# Patient Record
Sex: Female | Born: 1950 | Race: White | Hispanic: No | Marital: Single | Smoking: Never smoker
Health system: Southern US, Community
[De-identification: ages and names within clinical notes are randomized; demographics above are authoritative.]

## PROBLEM LIST (undated history)

## (undated) DIAGNOSIS — F419 Anxiety disorder, unspecified: Secondary | ICD-10-CM

## (undated) DIAGNOSIS — J189 Pneumonia, unspecified organism: Secondary | ICD-10-CM

## (undated) DIAGNOSIS — R112 Nausea with vomiting, unspecified: Secondary | ICD-10-CM

## (undated) DIAGNOSIS — M1990 Unspecified osteoarthritis, unspecified site: Secondary | ICD-10-CM

## (undated) DIAGNOSIS — R51 Headache: Secondary | ICD-10-CM

## (undated) DIAGNOSIS — R519 Headache, unspecified: Secondary | ICD-10-CM

## (undated) DIAGNOSIS — K219 Gastro-esophageal reflux disease without esophagitis: Secondary | ICD-10-CM

## (undated) DIAGNOSIS — E039 Hypothyroidism, unspecified: Secondary | ICD-10-CM

## (undated) DIAGNOSIS — I509 Heart failure, unspecified: Secondary | ICD-10-CM

## (undated) DIAGNOSIS — D649 Anemia, unspecified: Secondary | ICD-10-CM

## (undated) DIAGNOSIS — E785 Hyperlipidemia, unspecified: Secondary | ICD-10-CM

## (undated) DIAGNOSIS — R011 Cardiac murmur, unspecified: Secondary | ICD-10-CM

## (undated) DIAGNOSIS — C801 Malignant (primary) neoplasm, unspecified: Secondary | ICD-10-CM

## (undated) DIAGNOSIS — Z9889 Other specified postprocedural states: Secondary | ICD-10-CM

## (undated) HISTORY — PX: TONSILLECTOMY: SUR1361

## (undated) HISTORY — PX: EYE SURGERY: SHX253

## (undated) HISTORY — PX: COLONOSCOPY: SHX174

## (undated) HISTORY — PX: APPENDECTOMY: SHX54

## (undated) HISTORY — PX: CHOLECYSTECTOMY: SHX55

## (undated) HISTORY — PX: OOPHORECTOMY: SHX86

## (undated) HISTORY — PX: BREAST BIOPSY: SHX20

## (undated) HISTORY — PX: OTHER SURGICAL HISTORY: SHX169

## (undated) HISTORY — PX: BREAST EXCISIONAL BIOPSY: SUR124

---

## 1996-02-09 HISTORY — PX: NEUROPLASTY / TRANSPOSITION ULNAR NERVE AT ELBOW: SUR895

## 2004-07-17 ENCOUNTER — Ambulatory Visit: Payer: Self-pay | Admitting: Unknown Physician Specialty

## 2004-08-19 ENCOUNTER — Ambulatory Visit: Payer: Self-pay | Admitting: Surgery

## 2004-09-18 ENCOUNTER — Ambulatory Visit: Payer: Self-pay | Admitting: Surgery

## 2004-11-26 ENCOUNTER — Ambulatory Visit: Payer: Self-pay | Admitting: Unknown Physician Specialty

## 2005-09-25 ENCOUNTER — Ambulatory Visit: Payer: Self-pay | Admitting: Unknown Physician Specialty

## 2006-01-14 ENCOUNTER — Ambulatory Visit: Payer: Self-pay | Admitting: Orthopedic Surgery

## 2006-09-15 DIAGNOSIS — C4492 Squamous cell carcinoma of skin, unspecified: Secondary | ICD-10-CM

## 2006-09-15 HISTORY — DX: Squamous cell carcinoma of skin, unspecified: C44.92

## 2006-10-06 ENCOUNTER — Ambulatory Visit: Payer: Self-pay | Admitting: Unknown Physician Specialty

## 2007-10-19 ENCOUNTER — Ambulatory Visit: Payer: Self-pay | Admitting: Unknown Physician Specialty

## 2007-12-10 ENCOUNTER — Emergency Department: Payer: Self-pay | Admitting: Emergency Medicine

## 2007-12-21 ENCOUNTER — Emergency Department: Payer: Self-pay | Admitting: Emergency Medicine

## 2008-06-23 ENCOUNTER — Ambulatory Visit: Payer: Self-pay | Admitting: Sports Medicine

## 2008-10-29 ENCOUNTER — Ambulatory Visit: Payer: Self-pay | Admitting: Unknown Physician Specialty

## 2008-12-09 ENCOUNTER — Ambulatory Visit: Payer: Self-pay | Admitting: Oncology

## 2008-12-31 ENCOUNTER — Ambulatory Visit: Payer: Self-pay | Admitting: Oncology

## 2009-01-02 ENCOUNTER — Ambulatory Visit: Payer: Self-pay | Admitting: Ophthalmology

## 2009-01-09 ENCOUNTER — Ambulatory Visit: Payer: Self-pay | Admitting: Oncology

## 2009-01-15 ENCOUNTER — Ambulatory Visit: Payer: Self-pay | Admitting: Ophthalmology

## 2009-02-26 ENCOUNTER — Ambulatory Visit: Payer: Self-pay | Admitting: Ophthalmology

## 2009-10-15 DIAGNOSIS — Z86018 Personal history of other benign neoplasm: Secondary | ICD-10-CM

## 2009-10-15 HISTORY — DX: Personal history of other benign neoplasm: Z86.018

## 2009-10-31 ENCOUNTER — Ambulatory Visit: Payer: Self-pay | Admitting: Unknown Physician Specialty

## 2009-11-12 ENCOUNTER — Ambulatory Visit: Payer: Self-pay | Admitting: Unknown Physician Specialty

## 2009-12-21 ENCOUNTER — Emergency Department: Payer: Self-pay | Admitting: Emergency Medicine

## 2010-06-02 ENCOUNTER — Ambulatory Visit: Payer: Self-pay | Admitting: Unknown Physician Specialty

## 2010-06-05 ENCOUNTER — Ambulatory Visit: Payer: Self-pay | Admitting: Unknown Physician Specialty

## 2010-09-23 IMAGING — CT CT HEAD WITHOUT CONTRAST
2 series · 16 of 30 positions shown, 20 images · non-contrast
Comparison: none

REASON FOR EXAM: right temporal headache, dizziness
COMMENTS:

[Series 2: without · axial · non-contrast · 0.42mm/px · z∈[+762,+882]mm · 13 of 30 slices shown, 17 images]
[im 3/30  brain]
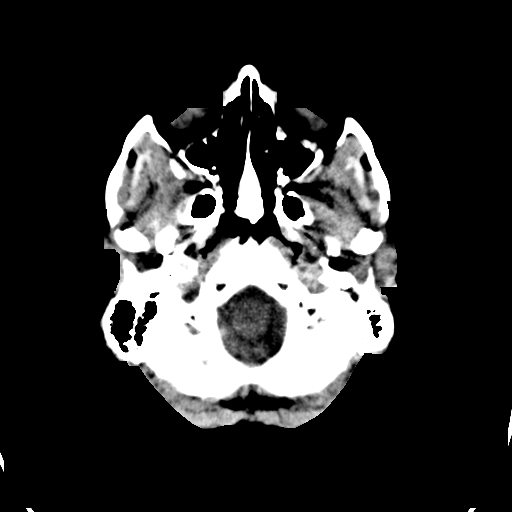
[im 3/30  bone]
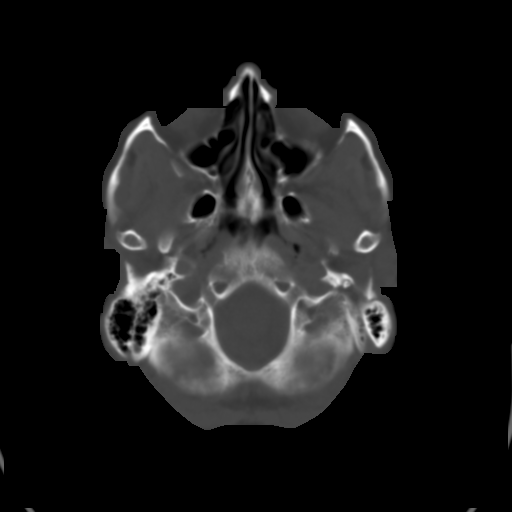
[im 5/30  brain]
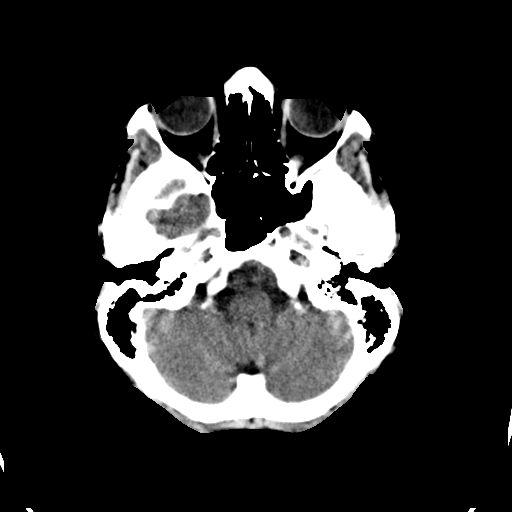
[im 7/30  brain]
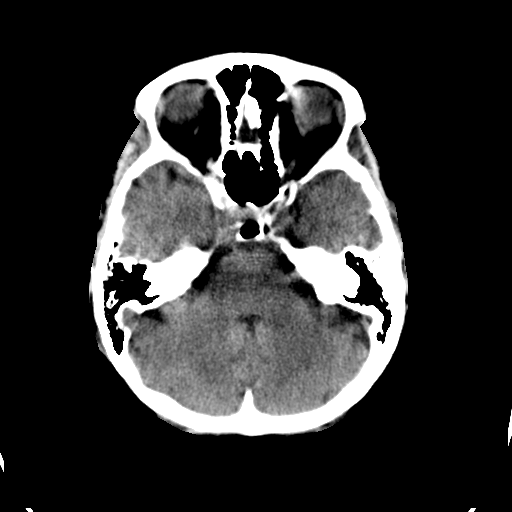
[im 9/30  brain]
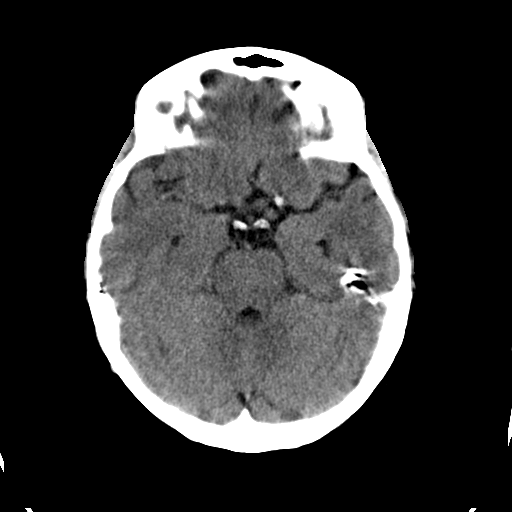
[im 11/30  brain]
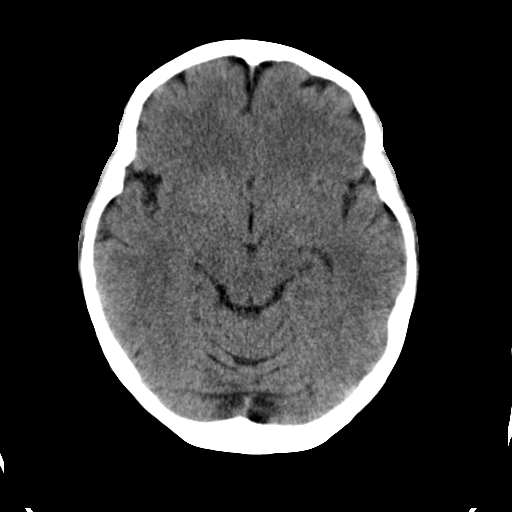
[im 11/30  bone]
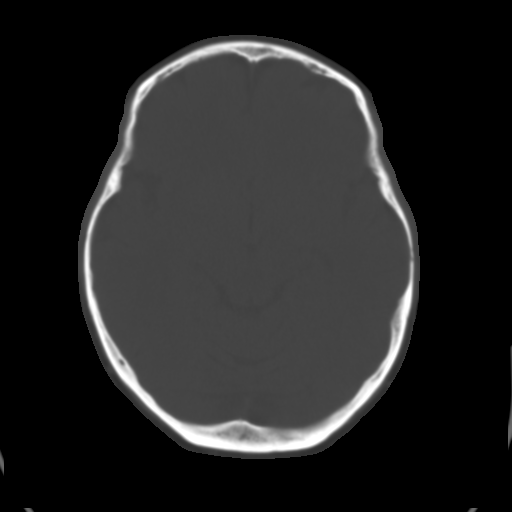
[im 13/30  brain]
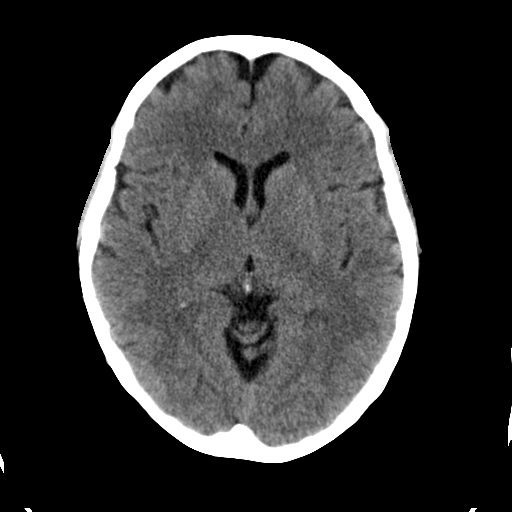
[im 15/30  brain]
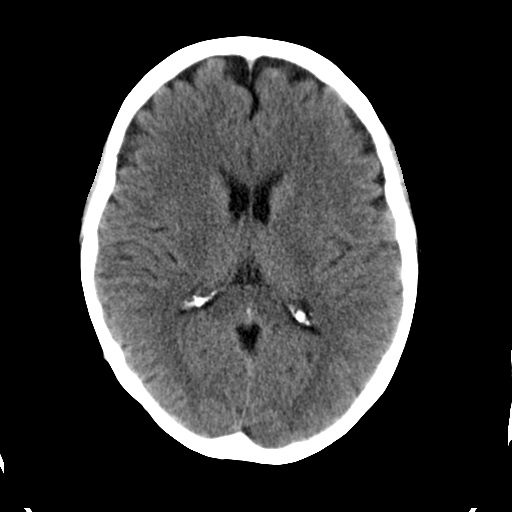
[im 17/30  brain]
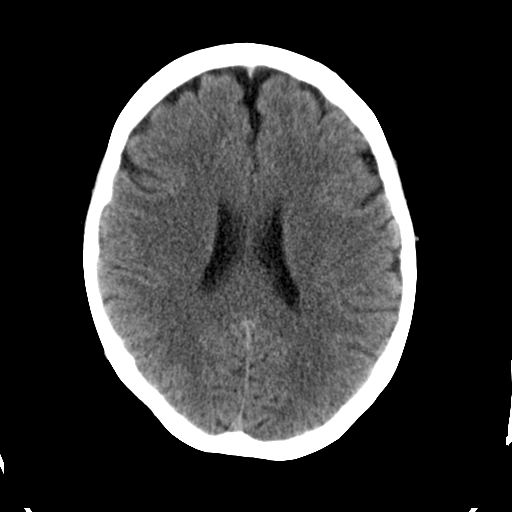
[im 19/30  brain]
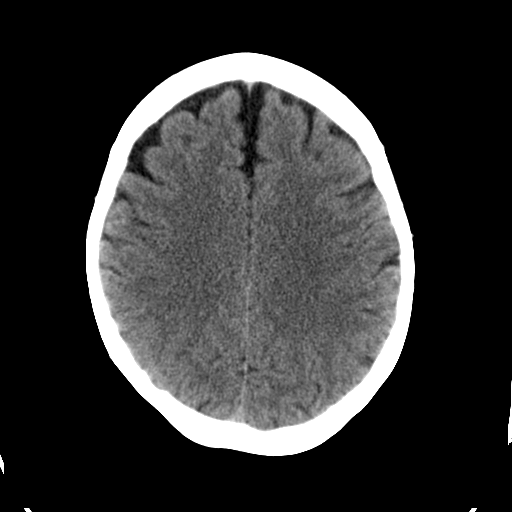
[im 19/30  bone]
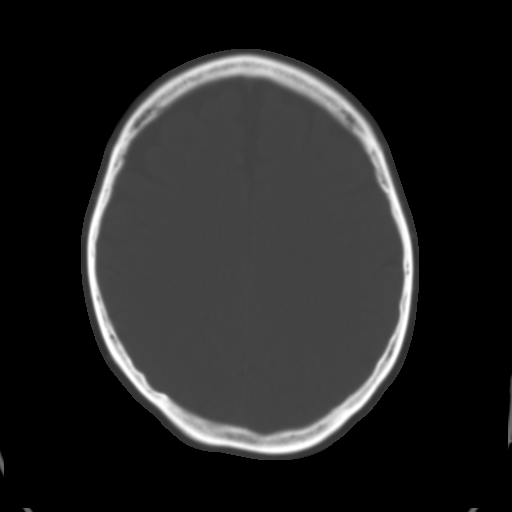
[im 21/30  brain]
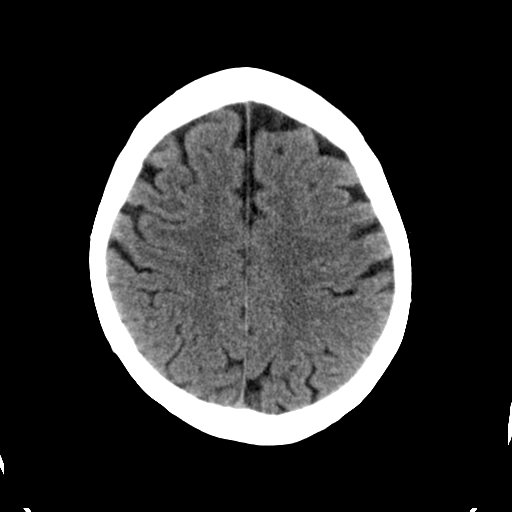
[im 23/30  brain]
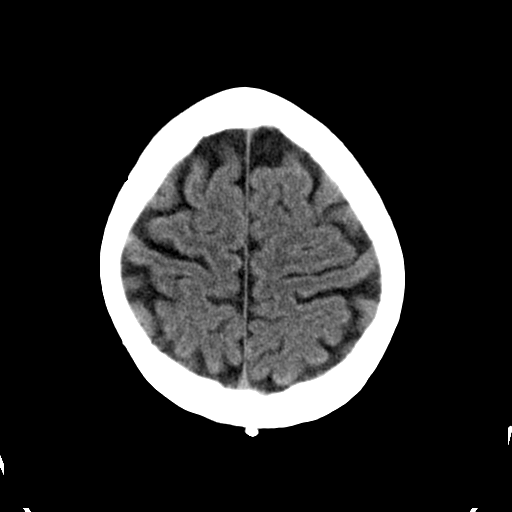
[im 25/30  brain]
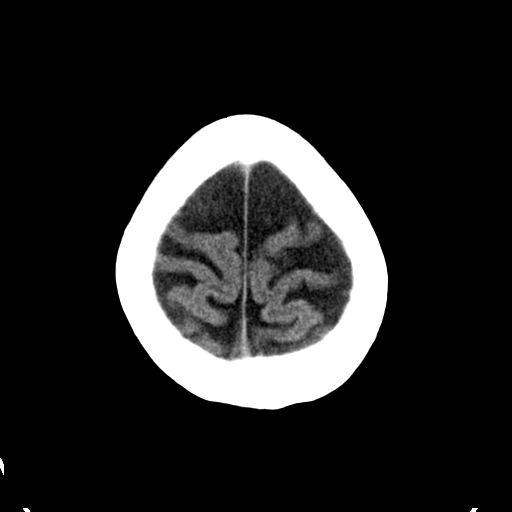
[im 27/30  brain]
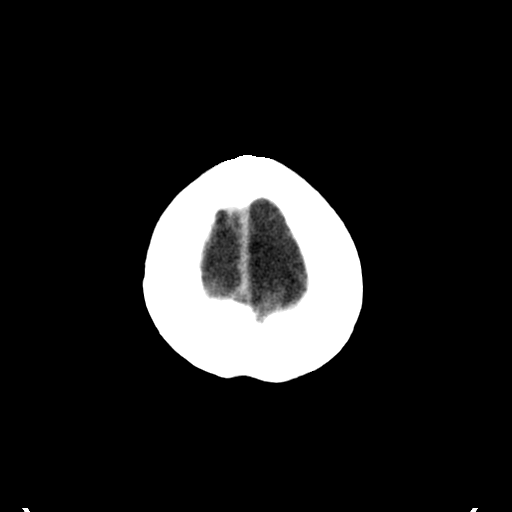
[im 27/30  bone]
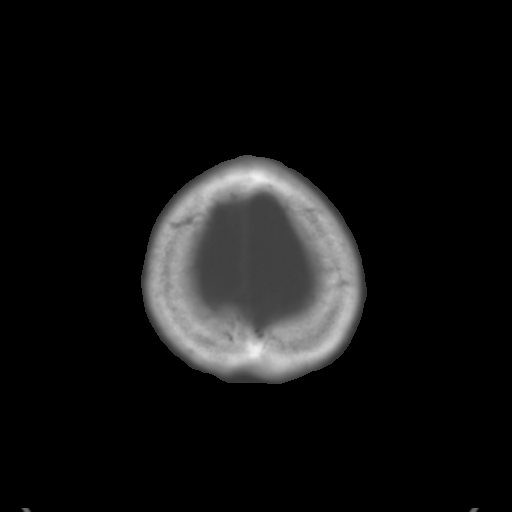

[Series 3: bone · axial · 0.42mm/px · z∈[+762,+802]mm · 3 of 30 slices shown]
[im 3/30  bone]
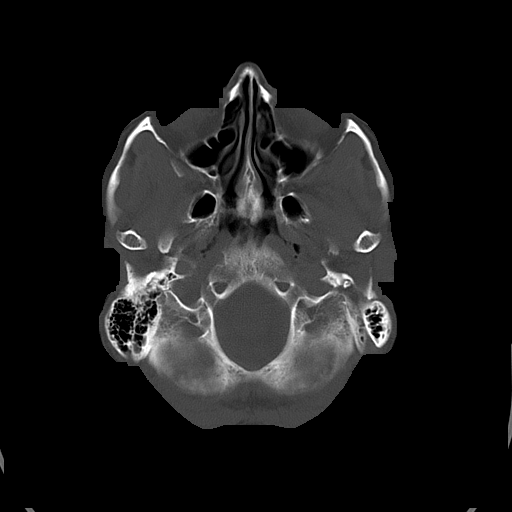
[im 7/30  bone]
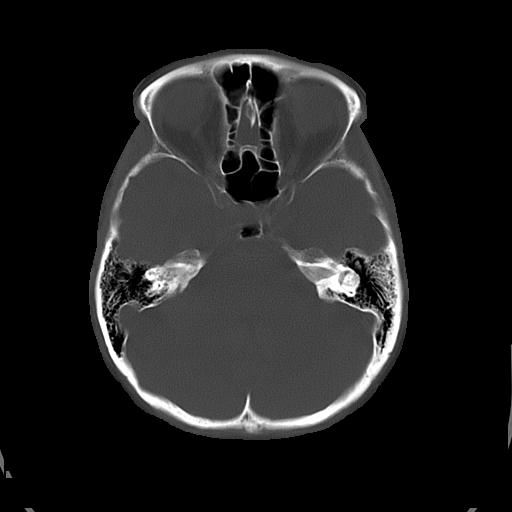
[im 11/30  bone]
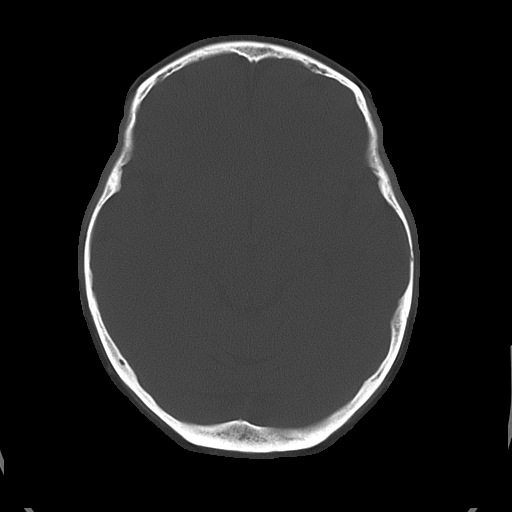

[16 of 30 positions shown; findings below may reference images not displayed]

PROCEDURE:     CT  - CT HEAD WITHOUT CONTRAST  - December 21, 2009  [DATE]

RESULT:     Axial CT scanning was performed through the brain at 5 mm
intervals and slice thicknesses.

The ventricles are normal in size and position. There is mild diffuse
cerebral atrophy. The cerebellum and brainstem are normal in density. There
is no evidence of an acute intracranial hemorrhage nor evidence of an
evolving ischemic infarction. At bone window settings I do not see evidence
of an acute skull fracture. The observed portions of the paranasal sinuses
are clear.
IMPRESSION: I see no acute intracranial abnormality.

## 2010-11-04 ENCOUNTER — Ambulatory Visit: Payer: Self-pay | Admitting: Unknown Physician Specialty

## 2011-01-29 ENCOUNTER — Ambulatory Visit: Payer: Self-pay | Admitting: Gastroenterology

## 2011-02-06 LAB — PATHOLOGY REPORT

## 2011-02-24 ENCOUNTER — Ambulatory Visit: Payer: Self-pay | Admitting: Gastroenterology

## 2011-05-21 ENCOUNTER — Ambulatory Visit: Payer: Self-pay | Admitting: Gastroenterology

## 2011-11-05 ENCOUNTER — Ambulatory Visit: Payer: Self-pay | Admitting: Physician Assistant

## 2011-11-25 ENCOUNTER — Ambulatory Visit: Payer: Self-pay | Admitting: Neurology

## 2012-03-24 DIAGNOSIS — E7800 Pure hypercholesterolemia, unspecified: Secondary | ICD-10-CM | POA: Insufficient documentation

## 2012-03-24 DIAGNOSIS — E559 Vitamin D deficiency, unspecified: Secondary | ICD-10-CM | POA: Insufficient documentation

## 2012-03-24 DIAGNOSIS — J309 Allergic rhinitis, unspecified: Secondary | ICD-10-CM | POA: Insufficient documentation

## 2012-03-24 DIAGNOSIS — C4492 Squamous cell carcinoma of skin, unspecified: Secondary | ICD-10-CM | POA: Insufficient documentation

## 2012-04-04 DIAGNOSIS — G5600 Carpal tunnel syndrome, unspecified upper limb: Secondary | ICD-10-CM | POA: Insufficient documentation

## 2012-08-26 DIAGNOSIS — R252 Cramp and spasm: Secondary | ICD-10-CM | POA: Insufficient documentation

## 2012-08-26 DIAGNOSIS — E039 Hypothyroidism, unspecified: Secondary | ICD-10-CM | POA: Insufficient documentation

## 2012-11-09 ENCOUNTER — Ambulatory Visit: Payer: Self-pay | Admitting: Internal Medicine

## 2013-07-03 DIAGNOSIS — M810 Age-related osteoporosis without current pathological fracture: Secondary | ICD-10-CM | POA: Insufficient documentation

## 2013-09-22 DIAGNOSIS — M1990 Unspecified osteoarthritis, unspecified site: Secondary | ICD-10-CM | POA: Insufficient documentation

## 2013-10-26 DIAGNOSIS — Z86010 Personal history of colonic polyps: Secondary | ICD-10-CM | POA: Insufficient documentation

## 2013-11-23 ENCOUNTER — Ambulatory Visit: Payer: Self-pay | Admitting: Internal Medicine

## 2014-03-28 DIAGNOSIS — M5090 Cervical disc disorder, unspecified, unspecified cervical region: Secondary | ICD-10-CM | POA: Insufficient documentation

## 2014-03-28 DIAGNOSIS — E7849 Other hyperlipidemia: Secondary | ICD-10-CM | POA: Insufficient documentation

## 2014-09-28 ENCOUNTER — Other Ambulatory Visit: Payer: Self-pay | Admitting: Internal Medicine

## 2014-09-28 DIAGNOSIS — M5010 Cervical disc disorder with radiculopathy, unspecified cervical region: Secondary | ICD-10-CM

## 2014-10-03 ENCOUNTER — Ambulatory Visit
Admission: RE | Admit: 2014-10-03 | Discharge: 2014-10-03 | Disposition: A | Discharge status: Home / Self Care | Payer: Medicare Other | Source: Ambulatory Visit | Attending: Internal Medicine | Admitting: Internal Medicine

## 2014-10-03 DIAGNOSIS — M5022 Other cervical disc displacement, mid-cervical region: Secondary | ICD-10-CM | POA: Insufficient documentation

## 2014-10-03 DIAGNOSIS — M4802 Spinal stenosis, cervical region: Secondary | ICD-10-CM | POA: Diagnosis not present

## 2014-10-03 DIAGNOSIS — M5010 Cervical disc disorder with radiculopathy, unspecified cervical region: Secondary | ICD-10-CM | POA: Diagnosis present

## 2014-12-06 ENCOUNTER — Other Ambulatory Visit: Payer: Self-pay | Admitting: Internal Medicine

## 2014-12-06 DIAGNOSIS — Z1231 Encounter for screening mammogram for malignant neoplasm of breast: Secondary | ICD-10-CM

## 2014-12-12 ENCOUNTER — Other Ambulatory Visit: Payer: Self-pay | Admitting: Internal Medicine

## 2014-12-12 ENCOUNTER — Ambulatory Visit
Admission: RE | Admit: 2014-12-12 | Discharge: 2014-12-12 | Disposition: A | Discharge status: Home / Self Care | Payer: Medicare Other | Source: Ambulatory Visit | Attending: Internal Medicine | Admitting: Internal Medicine

## 2014-12-12 DIAGNOSIS — Z1231 Encounter for screening mammogram for malignant neoplasm of breast: Secondary | ICD-10-CM

## 2014-12-12 HISTORY — DX: Malignant (primary) neoplasm, unspecified: C80.1

## 2015-01-22 DIAGNOSIS — Z85828 Personal history of other malignant neoplasm of skin: Secondary | ICD-10-CM

## 2015-01-22 HISTORY — DX: Personal history of other malignant neoplasm of skin: Z85.828

## 2015-03-27 DIAGNOSIS — E538 Deficiency of other specified B group vitamins: Secondary | ICD-10-CM | POA: Insufficient documentation

## 2015-05-23 ENCOUNTER — Other Ambulatory Visit: Payer: Self-pay | Admitting: Obstetrics and Gynecology

## 2015-05-23 DIAGNOSIS — Z1231 Encounter for screening mammogram for malignant neoplasm of breast: Secondary | ICD-10-CM

## 2015-08-09 ENCOUNTER — Other Ambulatory Visit: Payer: Self-pay | Admitting: Neurosurgery

## 2015-09-10 ENCOUNTER — Other Ambulatory Visit: Payer: Self-pay

## 2015-09-10 ENCOUNTER — Encounter (HOSPITAL_COMMUNITY): Payer: Self-pay

## 2015-09-10 ENCOUNTER — Encounter (HOSPITAL_COMMUNITY)
Admission: RE | Admit: 2015-09-10 | Discharge: 2015-09-10 | Disposition: A | Discharge status: Home / Self Care | Payer: Medicare Other | Source: Ambulatory Visit | Attending: Neurosurgery | Admitting: Neurosurgery

## 2015-09-10 DIAGNOSIS — M4722 Other spondylosis with radiculopathy, cervical region: Secondary | ICD-10-CM | POA: Insufficient documentation

## 2015-09-10 DIAGNOSIS — Z01818 Encounter for other preprocedural examination: Secondary | ICD-10-CM | POA: Diagnosis present

## 2015-09-10 DIAGNOSIS — Z01812 Encounter for preprocedural laboratory examination: Secondary | ICD-10-CM | POA: Insufficient documentation

## 2015-09-10 HISTORY — DX: Other specified postprocedural states: R11.2

## 2015-09-10 HISTORY — DX: Anxiety disorder, unspecified: F41.9

## 2015-09-10 HISTORY — DX: Headache: R51

## 2015-09-10 HISTORY — DX: Cardiac murmur, unspecified: R01.1

## 2015-09-10 HISTORY — DX: Anemia, unspecified: D64.9

## 2015-09-10 HISTORY — DX: Pneumonia, unspecified organism: J18.9

## 2015-09-10 HISTORY — DX: Hypothyroidism, unspecified: E03.9

## 2015-09-10 HISTORY — DX: Hyperlipidemia, unspecified: E78.5

## 2015-09-10 HISTORY — DX: Other specified postprocedural states: Z98.890

## 2015-09-10 HISTORY — DX: Unspecified osteoarthritis, unspecified site: M19.90

## 2015-09-10 HISTORY — DX: Gastro-esophageal reflux disease without esophagitis: K21.9

## 2015-09-10 HISTORY — DX: Headache, unspecified: R51.9

## 2015-09-10 LAB — CBC
HEMATOCRIT: 42.7 % (ref 36.0–46.0)
Hemoglobin: 13.9 g/dL (ref 12.0–15.0)
MCH: 30 pg (ref 26.0–34.0)
MCHC: 32.6 g/dL (ref 30.0–36.0)
MCV: 92 fL (ref 78.0–100.0)
PLATELETS: 329 10*3/uL (ref 150–400)
RBC: 4.64 MIL/uL (ref 3.87–5.11)
RDW: 12.6 % (ref 11.5–15.5)
WBC: 7.2 10*3/uL (ref 4.0–10.5)

## 2015-09-10 LAB — SURGICAL PCR SCREEN
MRSA, PCR: NEGATIVE
Staphylococcus aureus: NEGATIVE

## 2015-09-10 LAB — BASIC METABOLIC PANEL
Anion gap: 9 (ref 5–15)
BUN: 16 mg/dL (ref 6–20)
CHLORIDE: 107 mmol/L (ref 101–111)
CO2: 25 mmol/L (ref 22–32)
CREATININE: 0.62 mg/dL (ref 0.44–1.00)
Calcium: 10 mg/dL (ref 8.9–10.3)
Glucose, Bld: 96 mg/dL (ref 65–99)
POTASSIUM: 4.2 mmol/L (ref 3.5–5.1)
SODIUM: 141 mmol/L (ref 135–145)

## 2015-09-10 NOTE — Progress Notes (Signed)
PCP is Dr Emily Filbert  States she saw Dr Billey Gosling 20+ years ago for a questionable heart murmur. She states she had a card cath at the time and everything was ok, she has not had any heart studies since.  Denies ever having an echo or stress test. States that it has many years since she had an EKG. Request sent for EKG, last office visit to Dr Ammie Ferrier office.

## 2015-09-10 NOTE — Pre-Procedure Instructions (Addendum)
Taylor Hunt  09/10/2015      [facility] [place], [place] [facility] [place] [address] Phone: [phone] Fax: [phone]    Your procedure is scheduled on May 10  Report to [place] at 630 A.M.  Call this number if you have problems the morning of surgery:  [phone]   Remember:  Do not eat food or drink liquids after midnight.  Take these medicines the morning of surgery with A SIP OF WATER  tylenol if needed,  Flonase nasal spray if needed, Levothyroxine (Synthroid)   Stop taking Aspirin, Ibuprofen, Advil, Motrin, Aleve, Herbal medications, Fish Oil, Celebrex   Do not wear jewelry, make-up or nail polish.  Do not wear lotions, powders, or perfumes.  You may wear deodorant.  Do not shave 48 hours prior to surgery.  Men may shave face and neck.  Do not bring valuables to the hospital.  [facility] is not responsible for any belongings or valuables.  Contacts, dentures or bridgework may not be worn into surgery.  Leave your suitcase in the car.  After surgery it may be brought to your room.  For patients admitted to the hospital, discharge time will be determined by your treatment team.  Patients discharged the day of surgery will not be allowed to drive home.    Special instructions:  [facility] - Preparing for Surgery  Before surgery, you can play an important role.  Because skin is not sterile, your skin needs to be as free of germs as possible.  You can reduce the number of germs on you skin by washing with CHG (chlorahexidine gluconate) soap before surgery.  CHG is an antiseptic cleaner which kills germs and bonds with the skin to continue killing germs even after washing.  Please DO NOT use if you have an allergy to CHG or antibacterial soaps.  If your skin becomes reddened/irritated stop using the CHG and inform your nurse when you arrive at Short Stay.  Do not shave (including  legs and underarms) for at least 48 hours prior to the first CHG shower.  You may shave your face.  Please follow these instructions carefully:   1.  Shower with CHG Soap the night before surgery and the                                morning of Surgery.  2.  If you choose to wash your hair, wash your hair first as usual with your       normal shampoo.  3.  After you shampoo, rinse your hair and body thoroughly to remove the                      Shampoo.  4.  Use CHG as you would any other liquid soap.  You can apply chg directly       to the skin and wash gently with scrungie or a clean washcloth.  5.  Apply the CHG Soap to your body ONLY FROM THE NECK DOWN.        Do not use on open wounds or open sores.  Avoid contact with your eyes,       ears, mouth and genitals (private parts).  Wash genitals (private parts)       with your normal soap.  6.  Wash thoroughly,  paying special attention to the area where your surgery        will be performed.  7.  Thoroughly rinse your body with warm water from the neck down.  8.  DO NOT shower/wash with your normal soap after using and rinsing off       the CHG Soap.  9.  Pat yourself dry with a clean towel.            10.  Wear clean pajamas.            11.  Place clean sheets on your bed the night of your first shower and do not        sleep with pets.  Day of Surgery  Do not apply any lotions/deoderants the morning of surgery.  Please wear clean clothes to the hospital/surgery center.     Please read over the following fact sheets that you were given. Pain Booklet, Coughing and Deep Breathing, MRSA Information and Surgical Site Infection Prevention

## 2015-09-18 ENCOUNTER — Inpatient Hospital Stay (HOSPITAL_COMMUNITY): Payer: Medicare Other

## 2015-09-18 ENCOUNTER — Inpatient Hospital Stay (HOSPITAL_COMMUNITY): Payer: Medicare Other | Admitting: Anesthesiology

## 2015-09-18 ENCOUNTER — Ambulatory Visit (HOSPITAL_COMMUNITY)
Admission: RE | Admit: 2015-09-18 | Discharge: 2015-09-19 | Disposition: A | Discharge status: Home / Self Care | Payer: Medicare Other | Source: Ambulatory Visit | Attending: Neurosurgery | Admitting: Neurosurgery

## 2015-09-18 ENCOUNTER — Encounter (HOSPITAL_COMMUNITY): Payer: Self-pay | Admitting: Surgery

## 2015-09-18 ENCOUNTER — Encounter (HOSPITAL_COMMUNITY)
Admission: RE | Disposition: A | Discharge status: Home / Self Care | Payer: Self-pay | Source: Ambulatory Visit | Attending: Neurosurgery

## 2015-09-18 DIAGNOSIS — M5412 Radiculopathy, cervical region: Secondary | ICD-10-CM | POA: Diagnosis present

## 2015-09-18 DIAGNOSIS — E785 Hyperlipidemia, unspecified: Secondary | ICD-10-CM | POA: Insufficient documentation

## 2015-09-18 DIAGNOSIS — Z419 Encounter for procedure for purposes other than remedying health state, unspecified: Secondary | ICD-10-CM

## 2015-09-18 DIAGNOSIS — E039 Hypothyroidism, unspecified: Secondary | ICD-10-CM | POA: Insufficient documentation

## 2015-09-18 DIAGNOSIS — M50123 Cervical disc disorder at C6-C7 level with radiculopathy: Secondary | ICD-10-CM | POA: Diagnosis not present

## 2015-09-18 HISTORY — PX: ANTERIOR CERVICAL DECOMP/DISCECTOMY FUSION: SHX1161

## 2015-09-18 SURGERY — ANTERIOR CERVICAL DECOMPRESSION/DISCECTOMY FUSION 2 LEVELS
Anesthesia: General

## 2015-09-18 MED ORDER — DEXAMETHASONE SODIUM PHOSPHATE 4 MG/ML IJ SOLN
4.0000 mg | Freq: Four times a day (QID) | INTRAMUSCULAR | Status: AC
  Administered 2015-09-18: 4 mg via INTRAVENOUS
  Filled 2015-09-18 (×2): qty 1

## 2015-09-18 MED ORDER — 0.9 % SODIUM CHLORIDE (POUR BTL) OPTIME
TOPICAL | Status: DC | PRN
  Administered 2015-09-18: 1000 mL

## 2015-09-18 MED ORDER — FLUTICASONE PROPIONATE 50 MCG/ACT NA SUSP
1.0000 | Freq: Every day | NASAL | Status: DC | PRN
  Filled 2015-09-18: qty 16

## 2015-09-18 MED ORDER — HYDROMORPHONE HCL 1 MG/ML IJ SOLN
INTRAMUSCULAR | Status: AC
  Administered 2015-09-18: 0.25 mg via INTRAVENOUS
  Filled 2015-09-18: qty 1

## 2015-09-18 MED ORDER — BACITRACIN ZINC 500 UNIT/GM EX OINT
TOPICAL_OINTMENT | CUTANEOUS | Status: DC | PRN
  Administered 2015-09-18: 1 via TOPICAL

## 2015-09-18 MED ORDER — LEVOTHYROXINE SODIUM 112 MCG PO TABS
112.0000 ug | ORAL_TABLET | Freq: Every day | ORAL | Status: DC
  Administered 2015-09-19: 112 ug via ORAL
  Filled 2015-09-18: qty 1

## 2015-09-18 MED ORDER — SODIUM CHLORIDE 0.9 % IR SOLN
Status: DC | PRN
  Administered 2015-09-18: 08:00:00

## 2015-09-18 MED ORDER — PROPOFOL 10 MG/ML IV BOLUS
INTRAVENOUS | Status: AC
  Filled 2015-09-18: qty 20

## 2015-09-18 MED ORDER — MIDAZOLAM HCL 5 MG/5ML IJ SOLN
INTRAMUSCULAR | Status: DC | PRN
  Administered 2015-09-18: 2 mg via INTRAVENOUS

## 2015-09-18 MED ORDER — DOCUSATE SODIUM 100 MG PO CAPS
100.0000 mg | ORAL_CAPSULE | Freq: Two times a day (BID) | ORAL | Status: DC
  Administered 2015-09-18: 100 mg via ORAL
  Filled 2015-09-18: qty 1

## 2015-09-18 MED ORDER — PHENOL 1.4 % MT LIQD: 1.0000 | OROMUCOSAL | Status: DC | PRN

## 2015-09-18 MED ORDER — LACTATED RINGERS IV SOLN: INTRAVENOUS | Status: DC

## 2015-09-18 MED ORDER — ONDANSETRON HCL 4 MG/2ML IJ SOLN
INTRAMUSCULAR | Status: AC
  Filled 2015-09-18: qty 2

## 2015-09-18 MED ORDER — ADULT MULTIVITAMIN W/MINERALS CH: 1.0000 | ORAL_TABLET | Freq: Every day | ORAL | Status: DC

## 2015-09-18 MED ORDER — THROMBIN 5000 UNITS EX SOLR
CUTANEOUS | Status: DC | PRN
  Administered 2015-09-18 (×4): 5000 [IU] via TOPICAL

## 2015-09-18 MED ORDER — ROCURONIUM BROMIDE 50 MG/5ML IV SOLN
INTRAVENOUS | Status: AC
  Filled 2015-09-18: qty 1

## 2015-09-18 MED ORDER — PROPOFOL 500 MG/50ML IV EMUL
INTRAVENOUS | Status: DC | PRN
  Administered 2015-09-18: 100 ug/kg/min via INTRAVENOUS

## 2015-09-18 MED ORDER — ACETAMINOPHEN 650 MG RE SUPP: 650.0000 mg | RECTAL | Status: DC | PRN

## 2015-09-18 MED ORDER — HYDROMORPHONE HCL 1 MG/ML IJ SOLN
0.2500 mg | INTRAMUSCULAR | Status: DC | PRN
  Administered 2015-09-18 (×4): 0.25 mg via INTRAVENOUS

## 2015-09-18 MED ORDER — ACETAMINOPHEN 325 MG PO TABS
650.0000 mg | ORAL_TABLET | ORAL | Status: DC | PRN
  Administered 2015-09-18 – 2015-09-19 (×3): 650 mg via ORAL
  Filled 2015-09-18 (×3): qty 2

## 2015-09-18 MED ORDER — BUPIVACAINE-EPINEPHRINE (PF) 0.5% -1:200000 IJ SOLN
INTRAMUSCULAR | Status: DC | PRN
  Administered 2015-09-18: 10 mL via PERINEURAL

## 2015-09-18 MED ORDER — CEFAZOLIN SODIUM-DEXTROSE 2-4 GM/100ML-% IV SOLN
2.0000 g | INTRAVENOUS | Status: AC
  Administered 2015-09-18: 2 g via INTRAVENOUS

## 2015-09-18 MED ORDER — SCOPOLAMINE 1 MG/3DAYS TD PT72
MEDICATED_PATCH | TRANSDERMAL | Status: AC
  Administered 2015-09-18: 1 via TRANSDERMAL
  Filled 2015-09-18: qty 1

## 2015-09-18 MED ORDER — PROMETHAZINE HCL 25 MG/ML IJ SOLN
25.0000 mg | Freq: Once | INTRAMUSCULAR | Status: AC
  Administered 2015-09-18: 25 mg via INTRAMUSCULAR
  Filled 2015-09-18: qty 1

## 2015-09-18 MED ORDER — FENTANYL CITRATE (PF) 250 MCG/5ML IJ SOLN
INTRAMUSCULAR | Status: AC
  Filled 2015-09-18: qty 5

## 2015-09-18 MED ORDER — ONDANSETRON HCL 4 MG/2ML IJ SOLN
4.0000 mg | Freq: Four times a day (QID) | INTRAMUSCULAR | Status: AC | PRN
  Administered 2015-09-18: 4 mg via INTRAVENOUS

## 2015-09-18 MED ORDER — PROPOFOL 1000 MG/100ML IV EMUL
INTRAVENOUS | Status: AC
  Filled 2015-09-18: qty 200

## 2015-09-18 MED ORDER — PROPOFOL 10 MG/ML IV BOLUS
INTRAVENOUS | Status: DC | PRN
  Administered 2015-09-18: 200 mg via INTRAVENOUS

## 2015-09-18 MED ORDER — ALUM & MAG HYDROXIDE-SIMETH 200-200-20 MG/5ML PO SUSP: 30.0000 mL | Freq: Four times a day (QID) | ORAL | Status: DC | PRN

## 2015-09-18 MED ORDER — LIDOCAINE 2% (20 MG/ML) 5 ML SYRINGE
INTRAMUSCULAR | Status: AC
  Filled 2015-09-18: qty 5

## 2015-09-18 MED ORDER — SODIUM CHLORIDE 0.9 % IV SOLN
0.0500 ug/kg/min | INTRAVENOUS | Status: AC
Start: 2015-09-18 — End: 2015-09-18
  Administered 2015-09-18: 0.05 ug/kg/min via INTRAVENOUS
  Filled 2015-09-18: qty 5000

## 2015-09-18 MED ORDER — DIAZEPAM 5 MG PO TABS
5.0000 mg | ORAL_TABLET | Freq: Four times a day (QID) | ORAL | Status: DC | PRN
  Administered 2015-09-18: 5 mg via ORAL
  Filled 2015-09-18: qty 1

## 2015-09-18 MED ORDER — HYDROMORPHONE HCL 1 MG/ML IJ SOLN: 1.0000 mg | INTRAMUSCULAR | Status: DC | PRN

## 2015-09-18 MED ORDER — HEMOSTATIC AGENTS (NO CHARGE) OPTIME
TOPICAL | Status: DC | PRN
  Administered 2015-09-18 (×2): 1 via TOPICAL

## 2015-09-18 MED ORDER — ROCURONIUM BROMIDE 100 MG/10ML IV SOLN
INTRAVENOUS | Status: DC | PRN
  Administered 2015-09-18: 10 mg via INTRAVENOUS
  Administered 2015-09-18 (×3): 20 mg via INTRAVENOUS
  Administered 2015-09-18: 50 mg via INTRAVENOUS

## 2015-09-18 MED ORDER — SUGAMMADEX SODIUM 200 MG/2ML IV SOLN
INTRAVENOUS | Status: AC
  Filled 2015-09-18: qty 2

## 2015-09-18 MED ORDER — LIDOCAINE HCL (CARDIAC) 20 MG/ML IV SOLN
INTRAVENOUS | Status: DC | PRN
  Administered 2015-09-18: 60 mg via INTRAVENOUS

## 2015-09-18 MED ORDER — ACETAMINOPHEN 10 MG/ML IV SOLN
INTRAVENOUS | Status: DC | PRN
  Administered 2015-09-18: 1000 mg via INTRAVENOUS

## 2015-09-18 MED ORDER — MIDAZOLAM HCL 2 MG/2ML IJ SOLN
INTRAMUSCULAR | Status: AC
  Filled 2015-09-18: qty 2

## 2015-09-18 MED ORDER — OXYCODONE HCL 5 MG PO TABS: 5.0000 mg | ORAL_TABLET | Freq: Once | ORAL | Status: DC | PRN

## 2015-09-18 MED ORDER — LACTATED RINGERS IV SOLN
INTRAVENOUS | Status: DC | PRN
  Administered 2015-09-18 (×2): via INTRAVENOUS

## 2015-09-18 MED ORDER — DEXAMETHASONE SODIUM PHOSPHATE 10 MG/ML IJ SOLN
INTRAMUSCULAR | Status: DC | PRN
  Administered 2015-09-18: 10 mg via INTRAVENOUS

## 2015-09-18 MED ORDER — MONTELUKAST SODIUM 10 MG PO TABS
10.0000 mg | ORAL_TABLET | Freq: Every day | ORAL | Status: DC
  Administered 2015-09-18: 10 mg via ORAL
  Filled 2015-09-18: qty 1

## 2015-09-18 MED ORDER — SUGAMMADEX SODIUM 200 MG/2ML IV SOLN
INTRAVENOUS | Status: DC | PRN
  Administered 2015-09-18: 150 mg via INTRAVENOUS

## 2015-09-18 MED ORDER — FAMOTIDINE 20 MG PO TABS: 40.0000 mg | ORAL_TABLET | Freq: Every evening | ORAL | Status: DC | PRN

## 2015-09-18 MED ORDER — ONDANSETRON HCL 4 MG/2ML IJ SOLN
4.0000 mg | INTRAMUSCULAR | Status: DC | PRN
  Administered 2015-09-18 (×2): 4 mg via INTRAVENOUS
  Filled 2015-09-18 (×2): qty 2

## 2015-09-18 MED ORDER — MENTHOL 3 MG MT LOZG: 1.0000 | LOZENGE | OROMUCOSAL | Status: DC | PRN

## 2015-09-18 MED ORDER — DEXAMETHASONE SODIUM PHOSPHATE 10 MG/ML IJ SOLN
INTRAMUSCULAR | Status: AC
  Filled 2015-09-18: qty 1

## 2015-09-18 MED ORDER — BISACODYL 10 MG RE SUPP: 10.0000 mg | Freq: Every day | RECTAL | Status: DC | PRN

## 2015-09-18 MED ORDER — DEXAMETHASONE 4 MG PO TABS
4.0000 mg | ORAL_TABLET | Freq: Four times a day (QID) | ORAL | Status: AC
  Administered 2015-09-18 (×2): 4 mg via ORAL
  Filled 2015-09-18 (×2): qty 1

## 2015-09-18 MED ORDER — CEFAZOLIN SODIUM-DEXTROSE 2-4 GM/100ML-% IV SOLN
INTRAVENOUS | Status: AC
  Filled 2015-09-18: qty 100

## 2015-09-18 MED ORDER — CEFAZOLIN SODIUM-DEXTROSE 2-4 GM/100ML-% IV SOLN
2.0000 g | Freq: Three times a day (TID) | INTRAVENOUS | Status: AC
  Administered 2015-09-18 (×2): 2 g via INTRAVENOUS
  Filled 2015-09-18 (×2): qty 100

## 2015-09-18 MED ORDER — HYDROMORPHONE HCL 2 MG PO TABS: 2.0000 mg | ORAL_TABLET | ORAL | Status: DC | PRN

## 2015-09-18 MED ORDER — FENTANYL CITRATE (PF) 100 MCG/2ML IJ SOLN
INTRAMUSCULAR | Status: DC | PRN
  Administered 2015-09-18 (×5): 50 ug via INTRAVENOUS
  Administered 2015-09-18: 150 ug via INTRAVENOUS
  Administered 2015-09-18: 100 ug via INTRAVENOUS

## 2015-09-18 MED ORDER — OXYCODONE HCL 5 MG/5ML PO SOLN: 5.0000 mg | Freq: Once | ORAL | Status: DC | PRN

## 2015-09-18 MED ORDER — ONDANSETRON HCL 4 MG/2ML IJ SOLN
INTRAMUSCULAR | Status: DC | PRN
  Administered 2015-09-18: 4 mg via INTRAVENOUS

## 2015-09-18 MED ORDER — ACETAMINOPHEN 10 MG/ML IV SOLN
INTRAVENOUS | Status: AC
  Filled 2015-09-18: qty 100

## 2015-09-18 SURGICAL SUPPLY — 57 items
BAG DECANTER FOR FLEXI CONT (MISCELLANEOUS) ×3 IMPLANT
BENZOIN TINCTURE PRP APPL 2/3 (GAUZE/BANDAGES/DRESSINGS) ×3 IMPLANT
BIT DRILL NEURO 2X3.1 SFT TUCH (MISCELLANEOUS) ×1 IMPLANT
BLADE SURG 15 STRL LF DISP TIS (BLADE) ×1 IMPLANT
BLADE SURG 15 STRL SS (BLADE) ×2
BLADE ULTRA TIP 2M (BLADE) ×3 IMPLANT
BRUSH SCRUB EZ PLAIN DRY (MISCELLANEOUS) ×3 IMPLANT
BUR BARREL STRAIGHT FLUTE 4.0 (BURR) ×3 IMPLANT
BUR MATCHSTICK NEURO 3.0 LAGG (BURR) ×3 IMPLANT
CAGE PEEK VISTA-S 11X14X5MM (Cage) ×3 IMPLANT
CAGE PEEK VISTAS 11X14X6 (Cage) ×3 IMPLANT
CANISTER SUCT 3000ML PPV (MISCELLANEOUS) ×3 IMPLANT
CLOSURE WOUND 1/2 X4 (GAUZE/BANDAGES/DRESSINGS) ×1
CORDS BIPOLAR (ELECTRODE) ×3 IMPLANT
COVER MAYO STAND STRL (DRAPES) ×3 IMPLANT
DRAPE LAPAROTOMY 100X72 PEDS (DRAPES) ×3 IMPLANT
DRAPE MICROSCOPE LEICA (MISCELLANEOUS) IMPLANT
DRAPE POUCH INSTRU U-SHP 10X18 (DRAPES) ×3 IMPLANT
DRAPE SURG 17X23 STRL (DRAPES) ×6 IMPLANT
DRILL NEURO 2X3.1 SOFT TOUCH (MISCELLANEOUS) ×3
ELECT REM PT RETURN 9FT ADLT (ELECTROSURGICAL) ×3
ELECTRODE REM PT RTRN 9FT ADLT (ELECTROSURGICAL) ×1 IMPLANT
GAUZE SPONGE 4X4 12PLY STRL (GAUZE/BANDAGES/DRESSINGS) ×3 IMPLANT
GAUZE SPONGE 4X4 16PLY XRAY LF (GAUZE/BANDAGES/DRESSINGS) IMPLANT
GLOVE BIO SURGEON STRL SZ8 (GLOVE) ×3 IMPLANT
GLOVE BIO SURGEON STRL SZ8.5 (GLOVE) ×3 IMPLANT
GLOVE EXAM NITRILE LRG STRL (GLOVE) IMPLANT
GLOVE EXAM NITRILE MD LF STRL (GLOVE) IMPLANT
GLOVE EXAM NITRILE XL STR (GLOVE) IMPLANT
GLOVE EXAM NITRILE XS STR PU (GLOVE) IMPLANT
GOWN STRL REUS W/ TWL LRG LVL3 (GOWN DISPOSABLE) IMPLANT
GOWN STRL REUS W/ TWL XL LVL3 (GOWN DISPOSABLE) IMPLANT
GOWN STRL REUS W/TWL LRG LVL3 (GOWN DISPOSABLE)
GOWN STRL REUS W/TWL XL LVL3 (GOWN DISPOSABLE)
KIT BASIN OR (CUSTOM PROCEDURE TRAY) ×3 IMPLANT
KIT ROOM TURNOVER OR (KITS) ×3 IMPLANT
MARKER SKIN DUAL TIP RULER LAB (MISCELLANEOUS) ×3 IMPLANT
NEEDLE HYPO 22GX1.5 SAFETY (NEEDLE) ×3 IMPLANT
NEEDLE SPNL 18GX3.5 QUINCKE PK (NEEDLE) ×3 IMPLANT
NS IRRIG 1000ML POUR BTL (IV SOLUTION) ×3 IMPLANT
PACK LAMINECTOMY NEURO (CUSTOM PROCEDURE TRAY) ×3 IMPLANT
PATTIES SURGICAL .5 X.5 (GAUZE/BANDAGES/DRESSINGS) ×6 IMPLANT
PATTIES SURGICAL 1X1 (DISPOSABLE) ×3 IMPLANT
PIN DISTRACTION 14MM (PIN) ×6 IMPLANT
PLATE ANT CERV XTEND ELD 2 L26 (Plate) ×3 IMPLANT
PUTTY KINEX BIOACTIVE 5CC (Bone Implant) ×3 IMPLANT
RUBBERBAND STERILE (MISCELLANEOUS) IMPLANT
SCREW XTD VAR 4.2 SELF TAP 12 (Screw) ×18 IMPLANT
SPONGE INTESTINAL PEANUT (DISPOSABLE) ×6 IMPLANT
SPONGE SURGIFOAM ABS GEL SZ50 (HEMOSTASIS) ×6 IMPLANT
STRIP CLOSURE SKIN 1/2X4 (GAUZE/BANDAGES/DRESSINGS) ×2 IMPLANT
SUT VIC AB 0 CT1 27 (SUTURE) ×2
SUT VIC AB 0 CT1 27XBRD ANTBC (SUTURE) ×1 IMPLANT
SUT VIC AB 3-0 SH 8-18 (SUTURE) ×3 IMPLANT
TOWEL OR 17X24 6PK STRL BLUE (TOWEL DISPOSABLE) ×3 IMPLANT
TOWEL OR 17X26 10 PK STRL BLUE (TOWEL DISPOSABLE) ×3 IMPLANT
WATER STERILE IRR 1000ML POUR (IV SOLUTION) ×3 IMPLANT

## 2015-09-18 NOTE — Op Note (Signed)
Brief history: The patient is a 65 year old white female who has complained of neck and arm pain consistent with a cervical radiculopathy. She has failed medical management and was worked up with a cervical MRI. This demonstrated spondylosis and foraminal stenosis most prominent at C5-6 and C6-7. I discussed the various treatment options with the patient. She has weighed the risks, benefits, and alternatives to surgery and decided proceed with C5-6 and C6-7 anterior cervical discectomy, fusion, and plating.  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 Earle Gell  Asst.: Dr. Dayton Bailiff  Anesthesia: Gen. endotracheal  Estimated blood loss: 100 mL  Drains: None  Complications: None  Description of procedure: The patient was brought to the operating room by the anesthesia team. General endotracheal anesthesia was induced. A roll was placed under the patient's shoulders to keep the neck in the neutral position. The patient's anterior cervical region was then prepared with Betadine scrub and Betadine solution. Sterile drapes were applied.  The area to be incised was then injected with Marcaine with epinephrine solution. I then used a scalpel to make a transverse incision in the patient's left anterior neck. I used the Metzenbaum scissors to divide the platysmal muscle and then to dissect medial to the sternocleidomastoid muscle, jugular vein, and carotid artery. I carefully dissected down towards the anterior cervical spine identifying the  esophagus and retracting it medially. Then using Kitner swabs to clear soft tissue from the anterior cervical spine. We then inserted a bent spinal needle into the upper exposed intervertebral disc space. We then obtained intraoperative radiographs confirm our location.  I then used electrocautery to detach the medial border of the longus colli muscle bilaterally from the C5-6 and C6-7 intervertebral disc spaces. I then inserted the Caspar self-retaining retractor underneath the longus colli muscle bilaterally to provide exposure.  We then incised the intervertebral disc at C6-7. We then performed a partial intervertebral discectomy with a pituitary forceps and the Karlin curettes. I then inserted distraction screws into the vertebral bodies at C6-7. We then distracted the interspace. We then used the high-speed drill to decorticate the vertebral endplates at [id number], to drill away the remainder of the intervertebral disc, to drill away some posterior spondylosis, and to thin out the posterior longitudinal ligament. I then incised ligament with the arachnoid knife. We then removed the ligament with a Kerrison punches undercutting the vertebral endplates and decompressing the thecal sac. We then performed foraminotomies about the bilateral C7 nerve roots. This completed the decompression at this level.  I then repeated this procedure in analogous fashion at C5-6 decompressing the thecal sac and the bilateral C6 nerve roots.  We now turned our to attention to the interbody fusion. We used the trial spacers to determine the appropriate size for the interbody prosthesis. We then pre-filled prosthesis with a combination of local morcellized autograft bone that we obtained during decompression as well as Kinnex bone graft extender. We then inserted the prosthesis into the distracted interspace at C5-6 and C6-7. We then removed the distraction screws. There was a good snug fit of the prosthesis in the  interspace.  Having completed the fusion we now turned attention to  the anterior spinal instrumentation. We used the high-speed drill to drill away some anterior spondylosis at the disc spaces so that the plate lay down flat. We selected the appropriate length titanium anterior cervical plate. We laid it along the anterior aspect of the vertebral bodies from C5-C7. We then drilled 12 mm holes at C5, C6 and C7. We then secured the plate to the vertebral bodies by placing two 12 mm self-tapping screws at C5, C6 and C7. We then obtained intraoperative radiograph. The demonstrating good position of the instrumentation. We therefore secured the screws the plate the locking each cam. This completed the instrumentation.  We then obtained hemostasis using bipolar electrocautery. We irrigated the wound out with bacitracin solution. We then removed the retractor. We inspected the esophagus for any damage. There was none apparent. We then reapproximated patient's platysmal muscle with interrupted 3-0 Vicryl suture. We then reapproximated the subcutaneous tissue with interrupted 3-0 Vicryl suture. The skin was reapproximated with Steri-Strips and benzoin. The wound was then covered with bacitracin ointment. A sterile dressing was applied. The drapes were removed. Patient was subsequently extubated by the anesthesia team and transported to the post anesthesia care unit in stable condition. All sponge instrument and needle counts were reportedly correct at the end of this case.

## 2015-09-18 NOTE — Anesthesia Postprocedure Evaluation (Signed)
Anesthesia Post Note  Patient: Taylor Hunt  Procedure(s) Performed: Procedure(s) (LRB): ANTERIOR CERVICAL DECOMPRESSION/DISCECTOMY INTERBODY FUSION PLATING BONEGRAFT CERVICAL FIVE-SIX ,CERVICAL SIX-SEVEN  (N/A)  Patient location during evaluation: PACU Anesthesia Type: General Level of consciousness: awake and alert and patient cooperative Pain management: pain level controlled Vital Signs Assessment: post-procedure vital signs reviewed and stable Respiratory status: spontaneous breathing and respiratory function stable Cardiovascular status: stable Anesthetic complications: no    Last Vitals:  Filed Vitals:   09/18/15 1233 09/18/15 1255  BP:  155/83  Pulse:  95  Temp: 36.7 C 36.6 C  Resp:  18    Last Pain:  Filed Vitals:   09/18/15 1307  PainSc: [place]

## 2015-09-18 NOTE — Anesthesia Preprocedure Evaluation (Signed)
Anesthesia Evaluation  Patient identified by MRN, date of birth, ID band Patient awake    Reviewed: Allergy & Precautions, H&P , NPO status , Patient's Chart, lab work & pertinent test results  History of Anesthesia Complications (+) PONV  Airway Mallampati: II   Neck ROM: full    Dental   Pulmonary    breath sounds clear to auscultation       Cardiovascular negative cardio ROS   Rhythm:regular Rate:Normal     Neuro/Psych  Headaches, Anxiety    GI/Hepatic GERD  ,  Endo/Other  Hypothyroidism   Renal/GU      Musculoskeletal  (+) Arthritis ,   Abdominal   Peds  Hematology   Anesthesia Other Findings   Reproductive/Obstetrics                             Anesthesia Physical Anesthesia Plan  ASA: II  Anesthesia Plan: General   Post-op Pain Management:    Induction: Intravenous  Airway Management Planned: Oral ETT  Additional Equipment:   Intra-op Plan:   Post-operative Plan: Extubation in OR  Informed Consent: I have reviewed the patients History and Physical, chart, labs and discussed the procedure including the risks, benefits and alternatives for the proposed anesthesia with the patient or authorized representative who has indicated his/her understanding and acceptance.     Plan Discussed with: CRNA, Anesthesiologist and Surgeon  Anesthesia Plan Comments:         Anesthesia Quick Evaluation

## 2015-09-18 NOTE — H&P (Signed)
Subjective: The patient is a 65 year old white female who has complained of neck and left greater than right arm pain consistent with a cervical radiculopathy. She has failed medical management and was worked up with a cervical MRI. This demonstrates spondylosis, stenosis, etc. most prominent at C5-6 and C6-7. I discussed the various treatment options. She has decided to proceed with surgery.   Past Medical History  Diagnosis Date  . Cancer ([place])     skin  . PONV (postoperative nausea and vomiting)   . Hyperlipemia   . Heart murmur   . Pneumonia   . Hypothyroidism   . Anxiety   . GERD (gastroesophageal reflux disease)   . Headache   . Arthritis   . Anemia     Past Surgical History  Procedure Laterality Date  . Breast biopsy Right     negative 2000  . Skin cancer removed    . Tonsillectomy    . Appendectomy    . Oophorectomy    . Cholecystectomy    . Both shoulder surgery    . Right knee surgery    . Eye surgery    . Colonoscopy      Allergies  Allergen Reactions  . Fentanyl Nausea And Vomiting  . Codeine Other (See Comments)    Dizziness, nausea, vomiting  . Hydrocodone Nausea And Vomiting  . Lovastatin Other (See Comments)    Muscle pain  . Morphine Hives  . Tramadol Nausea And Vomiting    Social History  Substance Use Topics  . Smoking status: Never Smoker   . Smokeless tobacco: Not on file  . Alcohol Use: Yes     Comment: occ social    History reviewed. No pertinent family history. Prior to Admission medications   Medication Sig Start Date End Date Taking? Authorizing Provider  acetaminophen (TYLENOL) 325 MG tablet Take 650 mg by mouth every 6 (six) hours as needed for moderate pain or headache.   Yes Historical Provider, MD  B Complex-C (B-COMPLEX WITH VITAMIN C) tablet Take 1 tablet by mouth daily.   Yes Historical Provider, MD  calcium carbonate (OS-CAL - DOSED IN MG OF ELEMENTAL CALCIUM) 1250 (500 Ca) MG tablet Take 1 tablet by mouth daily with  breakfast.   Yes Historical Provider, MD  celecoxib (CELEBREX) 200 MG capsule Take 200 mg by mouth 2 (two) times daily.   Yes Historical Provider, MD  cyclobenzaprine (FLEXERIL) 10 MG tablet Take 10 mg by mouth at bedtime.   Yes Historical Provider, MD  fluticasone (FLONASE) 50 MCG/ACT nasal spray Place 1 spray into both nostrils daily as needed for allergies or rhinitis.   Yes Historical Provider, MD  levothyroxine (SYNTHROID, LEVOTHROID) 112 MCG tablet Take 112 mcg by mouth daily before breakfast.   Yes Historical Provider, MD  montelukast (SINGULAIR) 10 MG tablet Take 10 mg by mouth at bedtime.   Yes Historical Provider, MD  Multiple Vitamin (MULTIVITAMIN WITH MINERALS) TABS tablet Take 1 tablet by mouth daily.   Yes Historical Provider, MD  ranitidine (ZANTAC) 300 MG tablet Take 300 mg by mouth at bedtime. Takes as needed   Yes Historical Provider, MD     Review of Systems  Positive ROS: As above  All other systems have been reviewed and were otherwise negative with the exception of those mentioned in the HPI and as above.  Objective: Vital signs in last 24 hours: Temp:  [97.7 F (36.5 C)] 97.7 F (36.5 C) (05/10 0649) Pulse Rate:  [82] 82 (05/10 0649)  Resp:  [18] 18 (05/10 0649) BP: (128)/(79) 128/79 mmHg (05/10 0649) SpO2:  [100 %] 100 % (05/10 0649)  General Appearance: Alert, cooperative, no distress, Head: Normocephalic, without obvious abnormality, atraumatic Eyes: PERRL, conjunctiva/corneas clear, EOM's intact,    Ears: Normal  Throat: Normal  Neck: Supple, symmetrical, trachea midline, no adenopathy; thyroid: No enlargement/tenderness/nodules; no carotid bruit or JVD Back: Symmetric, no curvature, ROM normal, no CVA tenderness Lungs: Clear to auscultation bilaterally, respirations unlabored Heart: Regular rate and rhythm, no murmur, rub or gallop Abdomen: Soft, non-tender,, no masses, no organomegaly Extremities: Extremities normal, atraumatic, no cyanosis or  edema Pulses: 2+ and symmetric all extremities Skin: Skin color, texture, turgor normal, no rashes or lesions  NEUROLOGIC:   Mental status: alert and oriented, no aphasia, good attention span, Fund of knowledge/ memory ok Motor Exam - grossly normal Sensory Exam - grossly normal Reflexes:  Coordination - grossly normal Gait - grossly normal Balance - grossly normal Cranial Nerves: I: smell Not tested  II: visual acuity  OS: Normal  OD: Normal   II: visual fields Full to confrontation  II: pupils Equal, round, reactive to light  III,VII: ptosis None  III,IV,VI: extraocular muscles  Full ROM  V: mastication Normal  V: facial light touch sensation  Normal  V,VII: corneal reflex  Present  VII: facial muscle function - upper  Normal  VII: facial muscle function - lower Normal  VIII: hearing Not tested  IX: soft palate elevation  Normal  IX,X: gag reflex Present  XI: trapezius strength  5/5  XI: sternocleidomastoid strength 5/5  XI: neck flexion strength  5/5  XII: tongue strength  Normal    Data Review Lab Results  Component Value Date   WBC 7.2 09/10/2015   HGB 13.9 09/10/2015   HCT 42.7 09/10/2015   MCV 92.0 09/10/2015   PLT 329 09/10/2015   Lab Results  Component Value Date   NA 141 09/10/2015   K 4.2 09/10/2015   CL 107 09/10/2015   CO2 25 09/10/2015   BUN 16 09/10/2015   CREATININE 0.62 09/10/2015   GLUCOSE 96 09/10/2015   No results found for: INR, PROTIME  Assessment/Plan: 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 I have reviewed her MRI scan with her and pointed out the abnormalities. We have discussed the various treatment options including surgery. I have described the surgical treatment option of the C5-6 and C6-7 anterior cervical discectomy, fusion, and plating. I have shown her surgical models. We have discussed the risks, benefits, alternatives, and likelihood of  achieving her goals with surgery. I have answered all the patient's questions. She has decided to proceed with surgery.   Janett Kamath D 09/18/2015 8:26 AM

## 2015-09-18 NOTE — Anesthesia Procedure Notes (Signed)
Procedure Name: Intubation Date/Time: 09/18/2015 8:42 AM Performed by: Trixie Deis A Pre-anesthesia Checklist: Patient identified, Timeout performed, Emergency Drugs available, Suction available and Patient being monitored Patient Re-evaluated:Patient Re-evaluated prior to inductionOxygen Delivery Method: Circle system utilized Preoxygenation: Pre-oxygenation with 100% oxygen Intubation Type: IV induction Ventilation: Mask ventilation without difficulty Laryngoscope Size: Mac and 3 Grade View: Grade II Tube type: Oral Tube size: 7.0 mm Number of attempts: 2 (MD intubation) Airway Equipment and Method: Stylet Placement Confirmation: ETT inserted through vocal cords under direct vision,  breath sounds checked- equal and bilateral and positive ETCO2 Secured at: 21 cm Tube secured with: Tape Dental Injury: Teeth and Oropharynx as per pre-operative assessment

## 2015-09-18 NOTE — Transfer of Care (Signed)
Immediate Anesthesia Transfer of Care Note  Patient: Taylor Hunt  Procedure(s) Performed: Procedure(s): ANTERIOR CERVICAL DECOMPRESSION/DISCECTOMY INTERBODY FUSION PLATING BONEGRAFT CERVICAL FIVE-SIX ,CERVICAL SIX-SEVEN  (N/A)  Patient Location: PACU  Anesthesia Type:General  Level of Consciousness: awake, alert  and oriented  Airway & Oxygen Therapy: Patient Spontanous Breathing and Patient connected to nasal cannula oxygen  Post-op Assessment: Report given to RN, Post -op Vital signs reviewed and stable and Patient moving all extremities  Post vital signs: Reviewed and stable  Last Vitals:  Filed Vitals:   09/18/15 1141 09/18/15 1142  BP:  148/84  Pulse:  116  Temp: 37.4 C   Resp:  61    Last Pain:  Filed Vitals:   09/18/15 1146  PainSc: 7          Complications: No apparent anesthesia complications

## 2015-09-18 NOTE — Progress Notes (Signed)
Subjective:  The patient is alert and pleasant. She is in no apparent distress. She looks well.  Objective: Vital signs in last 24 hours: Temp:  [97.7 F (36.5 C)-99.4 F (37.4 C)] 99.4 F (37.4 C) (05/10 1141) Pulse Rate:  [82-116] 101 (05/10 1212) Resp:  [11-61] 11 (05/10 1212) BP: (128-148)/(78-84) 146/81 mmHg (05/10 1212) SpO2:  [98 %-100 %] 98 % (05/10 1212)  Intake/Output from previous day:   Intake/Output this shift: Total I/O In: 1400 [I.V.:1400] Out: 100 [Blood:100]  Physical exam the patient is alert. She is moving all 4 extremities well. Her dressing is clean and dry. There is no hematoma or shift.  Lab Results: No results for input(s): WBC, HGB, HCT, PLT in the last 72 hours. BMET No results for input(s): NA, K, CL, CO2, GLUCOSE, BUN, CREATININE, CALCIUM in the last 72 hours.  Studies/Results: Dg Cervical Spine 2-3 Views  09/18/2015  CLINICAL DATA:  C5-C7 ACDF EXAM: CERVICAL SPINE - 2-3 VIEW COMPARISON:  MRI 10/03/2014 FINDINGS: Film 1 demonstrates anterior localizing instrument at C5-6. Film 2 demonstrates anterior fusion changes from C5-C7. Normal alignment. No hardware complicating feature. IMPRESSION: C5-C7 ACDF. Electronically Signed   By: Rolm Baptise M.D.   On: 09/18/2015 11:28    Assessment/Plan: The patient is doing well.  LOS: 0 days     Jaquaya Coyle D 09/18/2015, 12:20 PM

## 2015-09-19 DIAGNOSIS — M50123 Cervical disc disorder at C6-C7 level with radiculopathy: Secondary | ICD-10-CM | POA: Diagnosis not present

## 2015-09-19 MED ORDER — DOCUSATE SODIUM 100 MG PO CAPS: 100.0000 mg | ORAL_CAPSULE | Freq: Two times a day (BID) | ORAL | Status: DC

## 2015-09-19 MED ORDER — PROMETHAZINE HCL 25 MG PO TABS: 25.0000 mg | ORAL_TABLET | Freq: Four times a day (QID) | ORAL | Status: DC | PRN

## 2015-09-19 NOTE — Discharge Instructions (Signed)

## 2015-09-19 NOTE — Discharge Summary (Signed)
Physician Discharge Summary  Patient ID: Taylor Hunt MRN: [id number] DOB/AGE: Sep 22, 1950 65 y.o.  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 The surgery went well.  The patient's postoperative course was remarkable only for some persistent nausea and vomiting. This resolved.  On postoperative day #1 the patient requested discharge to home. She did not want any pain medications because it makes her sick. The patient, and her cousin, were given written and oral discharge instructions. All questions were answered.  Consults: None Significant Diagnostic Studies: None Treatments: C5-6 and C6-7 anterior cervical discectomy, fusion, and plating. Discharge Exam: Blood pressure 135/76, pulse 88, temperature 98.4 F (36.9 C), temperature source Oral, resp. rate 16, SpO2 99 %. The patient is alert and pleasant. She looks well. Her dressing is clean and dry. There is no evidence of hematoma or shift. Her strength is normal in all 4 extremities.  Disposition: Home  Discharge Instructions    Call MD for:  difficulty breathing, headache or visual disturbances    Complete by:  As directed      Call MD for:  extreme fatigue    Complete by:  As directed      Call MD for:  hives    Complete by:  As directed      Call MD for:  persistant dizziness or light-headedness    Complete by:  As directed      Call MD for:  persistant nausea and vomiting    Complete by:  As directed      Call MD for:  redness, tenderness, or signs of infection (pain, swelling, redness, odor or green/yellow discharge around incision site)    Complete by:  As directed      Call MD for:  severe uncontrolled pain    Complete by:  As directed      Call MD for:  temperature >100.4    Complete by:  As directed      Diet - low sodium heart healthy    Complete by:  As directed      Discharge instructions    Complete by:  As directed   Call [phone] for a followup appointment. Take a stool softener while you are using pain medications.     Driving Restrictions    Complete by:  As directed   Do not drive for 2 weeks.     Increase activity slowly    Complete by:  As directed      Lifting restrictions    Complete by:  As directed   Do not lift more than 5 pounds. No excessive bending or twisting.     May shower / Bathe    Complete by:  As directed   He may shower after the pain she is removed 3 days after surgery. Leave the incision alone.     Remove dressing in 48 hours    Complete by:  As directed   Your stitches are under the scan and will dissolve by themselves. The Steri-Strips will fall off after you take a few showers. Do not rub back or pick at the wound, Leave the wound alone.            Medication List  STOP taking these medications        celecoxib 200 MG capsule  Commonly known as:  CELEBREX      TAKE these medications        acetaminophen 325 MG tablet  Commonly known as:  TYLENOL  Take 650 mg by mouth every 6 (six) hours as needed for moderate pain or headache.     B-complex with vitamin C tablet  Take 1 tablet by mouth daily.     calcium carbonate 1250 (500 Ca) MG tablet  Commonly known as:  OS-CAL - dosed in mg of elemental calcium  Take 1 tablet by mouth daily with breakfast.     cyclobenzaprine 10 MG tablet  Commonly known as:  FLEXERIL  Take 10 mg by mouth at bedtime.     docusate sodium 100 MG capsule  Commonly known as:  COLACE  Take 1 capsule (100 mg total) by mouth 2 (two) times daily.     fluticasone 50 MCG/ACT nasal spray  Commonly known as:  FLONASE  Place 1 spray into both nostrils daily as needed for  allergies or rhinitis.     levothyroxine 112 MCG tablet  Commonly known as:  SYNTHROID, LEVOTHROID  Take 112 mcg by mouth daily before breakfast.     montelukast 10 MG tablet  Commonly known as:  SINGULAIR  Take 10 mg by mouth at bedtime.     multivitamin with minerals Tabs tablet  Take 1 tablet by mouth daily.     promethazine 25 MG tablet  Commonly known as:  PHENERGAN  Take 1 tablet (25 mg total) by mouth every 6 (six) hours as needed for nausea.     ranitidine 300 MG tablet  Commonly known as:  ZANTAC  Take 300 mg by mouth at bedtime. Takes as needed         SignedOphelia Charter 09/19/2015, 7:36 AM

## 2015-09-19 NOTE — Progress Notes (Signed)
Pt and family given D/C instructions with Rx's, verbal understanding was provided. Pt's incision is clean and dry with no sign of infection. Pt's IV was removed prior to D/C. Pt D/C'd home via wheelchair @ 1220 per MD order. Pt is stable @ D/C and has no other needs at this time. Holli Humbles, RN

## 2015-09-20 ENCOUNTER — Encounter (HOSPITAL_COMMUNITY): Payer: Self-pay | Admitting: Neurosurgery

## 2015-12-13 ENCOUNTER — Other Ambulatory Visit: Payer: Self-pay | Admitting: Obstetrics and Gynecology

## 2015-12-13 ENCOUNTER — Ambulatory Visit
Admission: RE | Admit: 2015-12-13 | Discharge: 2015-12-13 | Disposition: A | Discharge status: Home / Self Care | Payer: Medicare Other | Source: Ambulatory Visit | Attending: Obstetrics and Gynecology | Admitting: Obstetrics and Gynecology

## 2015-12-13 DIAGNOSIS — Z1231 Encounter for screening mammogram for malignant neoplasm of breast: Secondary | ICD-10-CM

## 2016-04-20 DIAGNOSIS — Z85828 Personal history of other malignant neoplasm of skin: Secondary | ICD-10-CM | POA: Insufficient documentation

## 2016-04-20 DIAGNOSIS — M818 Other osteoporosis without current pathological fracture: Secondary | ICD-10-CM | POA: Insufficient documentation

## 2016-10-16 ENCOUNTER — Other Ambulatory Visit: Payer: Self-pay | Admitting: Obstetrics and Gynecology

## 2016-10-16 DIAGNOSIS — R1909 Other intra-abdominal and pelvic swelling, mass and lump: Secondary | ICD-10-CM

## 2016-10-22 ENCOUNTER — Ambulatory Visit
Admission: RE | Admit: 2016-10-22 | Discharge: 2016-10-22 | Disposition: A | Discharge status: Home / Self Care | Payer: Medicare Other | Source: Ambulatory Visit | Attending: Obstetrics and Gynecology | Admitting: Obstetrics and Gynecology

## 2016-10-22 DIAGNOSIS — R1909 Other intra-abdominal and pelvic swelling, mass and lump: Secondary | ICD-10-CM | POA: Insufficient documentation

## 2016-10-22 LAB — POCT I-STAT CREATININE: Creatinine, Ser: 0.6 mg/dL (ref 0.44–1.00)

## 2016-10-22 MED ORDER — GADOBENATE DIMEGLUMINE 529 MG/ML IV SOLN: 15.0000 mL | Freq: Once | INTRAVENOUS | Status: DC | PRN

## 2016-12-01 ENCOUNTER — Other Ambulatory Visit: Payer: Self-pay | Admitting: Obstetrics and Gynecology

## 2016-12-01 DIAGNOSIS — Z1231 Encounter for screening mammogram for malignant neoplasm of breast: Secondary | ICD-10-CM

## 2016-12-14 ENCOUNTER — Ambulatory Visit
Admission: RE | Admit: 2016-12-14 | Discharge: 2016-12-14 | Disposition: A | Discharge status: Home / Self Care | Payer: Medicare Other | Source: Ambulatory Visit | Attending: Obstetrics and Gynecology | Admitting: Obstetrics and Gynecology

## 2016-12-14 DIAGNOSIS — Z1231 Encounter for screening mammogram for malignant neoplasm of breast: Secondary | ICD-10-CM | POA: Insufficient documentation

## 2017-04-21 DIAGNOSIS — D369 Benign neoplasm, unspecified site: Secondary | ICD-10-CM | POA: Insufficient documentation

## 2017-06-28 DIAGNOSIS — K2100 Gastro-esophageal reflux disease with esophagitis, without bleeding: Secondary | ICD-10-CM | POA: Insufficient documentation

## 2017-06-29 ENCOUNTER — Ambulatory Visit: Payer: Medicare Other | Admitting: Podiatry

## 2017-06-29 ENCOUNTER — Ambulatory Visit (INDEPENDENT_AMBULATORY_CARE_PROVIDER_SITE_OTHER): Payer: Medicare Other | Admitting: Podiatry

## 2017-06-29 ENCOUNTER — Encounter: Payer: Self-pay | Admitting: Podiatry

## 2017-06-29 ENCOUNTER — Ambulatory Visit (INDEPENDENT_AMBULATORY_CARE_PROVIDER_SITE_OTHER): Payer: Medicare Other

## 2017-06-29 DIAGNOSIS — M2042 Other hammer toe(s) (acquired), left foot: Secondary | ICD-10-CM | POA: Diagnosis not present

## 2017-06-29 DIAGNOSIS — M6740 Ganglion, unspecified site: Secondary | ICD-10-CM

## 2017-06-29 DIAGNOSIS — L989 Disorder of the skin and subcutaneous tissue, unspecified: Secondary | ICD-10-CM | POA: Diagnosis not present

## 2017-06-29 DIAGNOSIS — M2041 Other hammer toe(s) (acquired), right foot: Secondary | ICD-10-CM | POA: Diagnosis not present

## 2017-06-29 DIAGNOSIS — M722 Plantar fascial fibromatosis: Secondary | ICD-10-CM

## 2017-06-29 NOTE — Progress Notes (Signed)
   Subjective:    Patient ID: Taylor Hunt, female    DOB: October 16, 1950, 67 y.o.   MRN: [id number]  HPI    Review of Systems  Musculoskeletal: Positive for gait problem.       Objective:   Physical Exam        Assessment & Plan:

## 2017-07-01 NOTE — Progress Notes (Signed)
   HPI: 67 year old female presenting today as a new patient with a chief complaint of aching to the plantar aspect of the left heel that began 2-3 months ago. She states it feels as if she is walking on a bruise. She also reports a painful lesion on the plantar aspect of the posterior heel and is concerned for a plantar wart or callus lesion. She also has noticed a nonpainful nodule to the lateral side of the left foot two days ago. She has been trying to file down the area on her heel for treatment. Patient is here for further evaluation and treatment.   Past Medical History:  Diagnosis Date  . Anemia   . Anxiety   . Arthritis   . Cancer ([place])    skin  . GERD (gastroesophageal reflux disease)   . Headache   . Heart murmur   . Hyperlipemia   . Hypothyroidism   . Pneumonia   . PONV (postoperative nausea and vomiting)      Objective: Physical Exam General: The patient is alert and oriented x3 in no acute distress.  Dermatology: Hyperkeratotic lesion present on the left heel. Pain on palpation with a central nucleated core noted. Skin is cool, dry and supple bilateral lower extremities. Negative for open lesions or macerations.  Vascular: Palpable pedal pulses bilaterally. No edema or erythema noted. Capillary refill within normal limits.  Neurological: Epicritic and protective threshold grossly intact bilaterally.   Musculoskeletal Exam: All pedal and ankle joints range of motion within normal limits bilateral. Muscle strength 5/5 in all groups bilateral. Hammertoe contracture deformity noted to digits 2-5 of the bilateral feet. Fluctuant, non-adhered mass noted to the left foot.  Radiographic Exam: Hammertoe contracture deformity noted to the interphalangeal joints and MPJ of the respective hammertoe digits mentioned on clinical musculoskeletal exam.     Assessment: - Ganglion cyst left - hammertoes 2-5 bilateral  - porokeratosis left heel   Plan of Care:  - Patient  evaluated. X-Rays reviewed.  - Pressure applied to ganglion cyst and was reabsorbed into tissues.  - Silicone toe caps dispensed for hammertoes.  - Excisional debridement of keratoic lesion using a chisel blade was performed without incident. Light dressing applied.  - Return to clinic as needed.     Edrick Kins, DPM [facility]  Dr. Edrick Kins, DPM    [address], [place] [address]                Office [phone]  Fax [phone]

## 2017-07-19 ENCOUNTER — Emergency Department: Payer: Medicare Other

## 2017-07-19 ENCOUNTER — Other Ambulatory Visit: Payer: Self-pay

## 2017-07-19 ENCOUNTER — Emergency Department
Admission: EM | Admit: 2017-07-19 | Discharge: 2017-07-19 | Disposition: A | Discharge status: Home / Self Care | Payer: Medicare Other | Source: Home / Self Care | Attending: Emergency Medicine | Admitting: Emergency Medicine

## 2017-07-19 ENCOUNTER — Encounter: Payer: Self-pay | Admitting: Emergency Medicine

## 2017-07-19 DIAGNOSIS — R509 Fever, unspecified: Secondary | ICD-10-CM

## 2017-07-19 DIAGNOSIS — R69 Illness, unspecified: Principal | ICD-10-CM

## 2017-07-19 DIAGNOSIS — M791 Myalgia, unspecified site: Secondary | ICD-10-CM | POA: Insufficient documentation

## 2017-07-19 DIAGNOSIS — E039 Hypothyroidism, unspecified: Secondary | ICD-10-CM | POA: Insufficient documentation

## 2017-07-19 DIAGNOSIS — R51 Headache: Secondary | ICD-10-CM | POA: Insufficient documentation

## 2017-07-19 DIAGNOSIS — R7881 Bacteremia: Secondary | ICD-10-CM | POA: Diagnosis not present

## 2017-07-19 DIAGNOSIS — J111 Influenza due to unidentified influenza virus with other respiratory manifestations: Secondary | ICD-10-CM

## 2017-07-19 DIAGNOSIS — W57XXXA Bitten or stung by nonvenomous insect and other nonvenomous arthropods, initial encounter: Secondary | ICD-10-CM

## 2017-07-19 LAB — COMPREHENSIVE METABOLIC PANEL
ALT: 55 U/L — ABNORMAL HIGH (ref 14–54)
AST: 56 U/L — ABNORMAL HIGH (ref 15–41)
Albumin: 4.6 g/dL (ref 3.5–5.0)
Alkaline Phosphatase: 95 U/L (ref 38–126)
Anion gap: 12 (ref 5–15)
BUN: 13 mg/dL (ref 6–20)
CHLORIDE: 102 mmol/L (ref 101–111)
CO2: 23 mmol/L (ref 22–32)
CREATININE: 0.66 mg/dL (ref 0.44–1.00)
Calcium: 9.7 mg/dL (ref 8.9–10.3)
Glucose, Bld: 128 mg/dL — ABNORMAL HIGH (ref 65–99)
POTASSIUM: 3.5 mmol/L (ref 3.5–5.1)
Sodium: 137 mmol/L (ref 135–145)
Total Bilirubin: 0.8 mg/dL (ref 0.3–1.2)
Total Protein: 8.4 g/dL — ABNORMAL HIGH (ref 6.5–8.1)

## 2017-07-19 LAB — CBC WITH DIFFERENTIAL/PLATELET
BASOS ABS: 0 10*3/uL (ref 0–0.1)
Basophils Relative: 1 %
EOS ABS: 0 10*3/uL (ref 0–0.7)
Eosinophils Relative: 1 %
HCT: 45.1 % (ref 35.0–47.0)
HEMOGLOBIN: 14.7 g/dL (ref 12.0–16.0)
LYMPHS PCT: 21 %
Lymphs Abs: 1.3 10*3/uL (ref 1.0–3.6)
MCH: 27.2 pg (ref 26.0–34.0)
MCHC: 32.5 g/dL (ref 32.0–36.0)
MCV: 83.6 fL (ref 80.0–100.0)
Monocytes Absolute: 1 10*3/uL — ABNORMAL HIGH (ref 0.2–0.9)
Monocytes Relative: 16 %
NEUTROS PCT: 61 %
Neutro Abs: 3.8 10*3/uL (ref 1.4–6.5)
Platelets: 276 10*3/uL (ref 150–440)
RBC: 5.39 MIL/uL — AB (ref 3.80–5.20)
RDW: 14.6 % — ABNORMAL HIGH (ref 11.5–14.5)
WBC: 6.2 10*3/uL (ref 3.6–11.0)

## 2017-07-19 LAB — URINALYSIS, COMPLETE (UACMP) WITH MICROSCOPIC
BILIRUBIN URINE: NEGATIVE
Bacteria, UA: NONE SEEN
Glucose, UA: NEGATIVE mg/dL
HGB URINE DIPSTICK: NEGATIVE
KETONES UR: NEGATIVE mg/dL
LEUKOCYTES UA: NEGATIVE
NITRITE: NEGATIVE
PROTEIN: NEGATIVE mg/dL
Specific Gravity, Urine: 1.002 — ABNORMAL LOW (ref 1.005–1.030)
pH: 6 (ref 5.0–8.0)

## 2017-07-19 LAB — INFLUENZA PANEL BY PCR (TYPE A & B)
INFLAPCR: NEGATIVE
Influenza B By PCR: NEGATIVE

## 2017-07-19 MED ORDER — DOXYCYCLINE HYCLATE 100 MG PO CAPS: 100.0000 mg | ORAL_CAPSULE | Freq: Two times a day (BID) | ORAL | 0 refills | Status: DC

## 2017-07-19 MED ORDER — KETOROLAC TROMETHAMINE 30 MG/ML IJ SOLN
30.0000 mg | Freq: Once | INTRAMUSCULAR | Status: AC
  Administered 2017-07-19: 30 mg via INTRAVENOUS
  Filled 2017-07-19: qty 1

## 2017-07-19 MED ORDER — BUTALBITAL-APAP-CAFFEINE 50-325-40 MG PO TABS: 1.0000 | ORAL_TABLET | Freq: Four times a day (QID) | ORAL | 0 refills | Status: AC | PRN

## 2017-07-19 MED ORDER — ACETAMINOPHEN 500 MG PO TABS
1000.0000 mg | ORAL_TABLET | Freq: Once | ORAL | Status: AC
  Administered 2017-07-19: 1000 mg via ORAL
  Filled 2017-07-19: qty 2

## 2017-07-19 MED ORDER — SODIUM CHLORIDE 0.9 % IV SOLN
INTRAVENOUS | Status: DC
  Administered 2017-07-19: 12:00:00 via INTRAVENOUS

## 2017-07-19 MED ORDER — METOCLOPRAMIDE HCL 5 MG/ML IJ SOLN
10.0000 mg | Freq: Once | INTRAMUSCULAR | Status: AC
  Administered 2017-07-19: 10 mg via INTRAVENOUS
  Filled 2017-07-19: qty 2

## 2017-07-19 MED ORDER — SODIUM CHLORIDE 0.9 % IV SOLN
100.0000 mg | Freq: Once | INTRAVENOUS | Status: AC
  Administered 2017-07-19: 100 mg via INTRAVENOUS
  Filled 2017-07-19: qty 100

## 2017-07-19 NOTE — ED Notes (Signed)
Dr Jimmye Norman at bedside to talk with pt

## 2017-07-19 NOTE — ED Provider Notes (Signed)
[facility] Emergency Department Provider Note       Time seen: ----------------------------------------- 12:01 PM on 07/19/2017 -----------------------------------------   I have reviewed the triage vital signs and the nursing notes.  HISTORY   Chief Complaint Fever    HPI Taylor Hunt is a 67 y.o. female with a history of anemia, anxiety, arthritis, skin cancer, GERD, hyperlipidemia and pneumonia who presents to the ED for possible meningitis.  Patient was seen at [facility] earlier today for similar symptoms.  She is complaining of head and neck pain as well as fever.  She did have a tick bite at the end of January she is not currently having a fever, denies congestion, sore throat, vomiting or diarrhea.  Past Medical History:  Diagnosis Date  . Anemia   . Anxiety   . Arthritis   . Cancer ([place])    skin  . GERD (gastroesophageal reflux disease)   . Headache   . Heart murmur   . Hyperlipemia   . Hypothyroidism   . Pneumonia   . PONV (postoperative nausea and vomiting)     Patient Active Problem List   Diagnosis Date Noted  . Gastro-esophageal reflux disease with esophagitis 06/28/2017  . Adult idiopathic generalized osteoporosis 04/20/2016  . History of SCC (squamous cell carcinoma) of skin 04/20/2016  . Cervical radiculopathy 09/18/2015  . B12 deficiency 03/27/2015  . Cervical disc disease 03/28/2014  . 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 Hx of adenomatous colonic polyps 10/26/2013  . OA (osteoarthritis) 09/22/2013  . Osteoporosis 07/03/2013  . Acquired hypothyroidism 08/26/2012  . Cramps, muscle, general 08/26/2012  . Carpal tunnel syndrome 04/04/2012  . Allergic rhinitis 03/24/2012  . Hypercholesteremia 03/24/2012  . Squamous cell carcinoma of skin 03/24/2012  . Vitamin D deficiency 03/24/2012    Past Surgical History:  Procedure Laterality Date  . ANTERIOR CERVICAL DECOMP/DISCECTOMY FUSION N/A 09/18/2015    Procedure: ANTERIOR CERVICAL DECOMPRESSION/DISCECTOMY INTERBODY FUSION PLATING BONEGRAFT CERVICAL FIVE-SIX ,CERVICAL SIX-SEVEN ;  Surgeon: Newman Pies, MD;  Location: [facility];  Service: Neurosurgery;  Laterality: N/A;  . APPENDECTOMY    . both shoulder surgery    . BREAST BIOPSY Right    negative 2000 x 2   . CHOLECYSTECTOMY    . COLONOSCOPY    . EYE SURGERY    . OOPHORECTOMY    . right knee surgery    . skin cancer removed    . TONSILLECTOMY      Allergies Fentanyl; Codeine; Hydrocodone; Lovastatin; Morphine; and Tramadol  Social History Social History   Tobacco Use  . Smoking status: Never Smoker  . Smokeless tobacco: Never Used  Substance Use Topics  . Alcohol use: Yes    Comment: occ social  . Drug use: No   Review of Systems Constitutional: Positive for fever Cardiovascular: Negative for chest pain. Respiratory: Negative for shortness of breath. Gastrointestinal: Negative for abdominal pain, vomiting and diarrhea. Genitourinary: Negative for dysuria. Musculoskeletal: Positive for back and neck pain Skin: Negative for rash. Neurological: Negative for headaches, focal weakness or numbness.  All systems negative/normal/unremarkable except as stated in the HPI  ____________________________________________   PHYSICAL EXAM:  VITAL SIGNS: ED Triage Vitals  Enc Vitals Group     BP      Pulse      Resp      Temp      Temp src      SpO2      Weight      Height  Head Circumference      Peak Flow      Pain Score      Pain Loc      Pain Edu?      Excl. in [place]?    Constitutional: Alert and oriented. Well appearing and in no distress. Eyes: Conjunctivae are normal. Normal extraocular movements.  Mild photophobia is noted ENT   Head: Normocephalic and atraumatic.   Nose: No congestion/rhinnorhea.   Mouth/Throat: Mucous membranes are moist.   Neck: No stridor. Cardiovascular: Normal rate, regular rhythm. No murmurs, rubs, or  gallops. Respiratory: Normal respiratory effort without tachypnea nor retractions. Breath sounds are clear and equal bilaterally. No wheezes/rales/rhonchi. Gastrointestinal: Soft and nontender. Normal bowel sounds Musculoskeletal: Nontender with normal range of motion in extremities. No lower extremity tenderness nor edema.  Mild pain is elicited with flexion and rotation of the neck Neurologic:  Normal speech and language. No gross focal neurologic deficits are appreciated.  Skin:  Skin is warm, dry and intact. No rash noted. Psychiatric: Mood and affect are normal. Speech and behavior are normal.  ___________________________________________  ED COURSE:  As part of my medical decision making, I reviewed the following data within the [place] History obtained from family if available, nursing notes, old chart and ekg, as well as notes from prior ED visits. Patient presented for fever and generalized myalgias, we will assess with labs and imaging as indicated at this time.   Procedures ____________________________________________   LABS (pertinent positives/negatives)  Labs Reviewed  COMPREHENSIVE METABOLIC PANEL - Abnormal; Notable for the following components:      Result Value   Glucose, Bld 128 (*)    Total Protein 8.4 (*)    AST 56 (*)    ALT 55 (*)    All other components within normal limits  CBC WITH DIFFERENTIAL/PLATELET - Abnormal; Notable for the following components:   RBC 5.39 (*)    RDW 14.6 (*)    Monocytes Absolute 1.0 (*)    All other components within normal limits  URINALYSIS, COMPLETE (UACMP) WITH MICROSCOPIC - Abnormal; Notable for the following components:   Color, Urine STRAW (*)    APPearance CLEAR (*)    Specific Gravity, Urine 1.002 (*)    Squamous Epithelial / LPF 0-5 (*)    All other components within normal limits  CULTURE, BLOOD (ROUTINE X 2)  CULTURE, BLOOD (ROUTINE X 2)  INFLUENZA PANEL BY PCR (TYPE A & B)  HIV ANTIBODY  (ROUTINE TESTING)  ROCKY MTN SPOTTED FVR ABS PNL(IGG+IGM)  B. BURGDORFI ANTIBODIES, CSF    RADIOLOGY  CT head IMPRESSION: 1. No acute intracranial abnormalities. The appearance of the brain is normal. ____________________________________________  DIFFERENTIAL DIAGNOSIS   Viral syndrome, influenza, meningitis, occult infection, tickborne illness  FINAL ASSESSMENT AND PLAN  Fever, myalgia   Plan: The patient had presented for fever and headache. Patient's labs were grossly unremarkable. Patient's imaging was also negative.  She may very well have a viral illness.  We discussed at length the possibility of needing a lumbar puncture.  We have agreed that she looks well clinically right now and certainly feels better.  I do not see obvious signs of meningitis clinically at this time.  I did give her a dose of IV doxycycline and she will be discharged with doxycycline.  I will advised follow-up in 1-2 days for recheck and advise further testing should her symptoms worsen.   Laurence Aly, MD   Note: This note was generated  in part or whole with voice recognition software. Voice recognition is usually quite accurate but there are transcription errors that can and very often do occur. I apologize for any typographical errors that were not detected and corrected.     Earleen Newport, MD 07/19/17 [phone]

## 2017-07-19 NOTE — ED Notes (Signed)
Pt c/o photophobia.  Using phone without difficulty. Is moving neck.

## 2017-07-19 NOTE — ED Notes (Signed)
Pt ambulating to restroom without difficulty.   

## 2017-07-19 NOTE — ED Triage Notes (Signed)
Fever 102 Friday. No fever since.  Severe neck and head pain. Sent for r/o meningitis as well as tic r/t illness.  Bit by tic end January. Extension/flexion of neck increased back pain per pt.

## 2017-07-20 ENCOUNTER — Other Ambulatory Visit: Payer: Self-pay

## 2017-07-20 ENCOUNTER — Telehealth: Payer: Self-pay | Admitting: Emergency Medicine

## 2017-07-20 ENCOUNTER — Inpatient Hospital Stay
Admission: EM | Admit: 2017-07-20 | Discharge: 2017-07-22 | DRG: 872 | Disposition: A | Discharge status: Home / Self Care | Payer: Medicare Other | Attending: Specialist | Admitting: Specialist

## 2017-07-20 ENCOUNTER — Encounter: Payer: Self-pay | Admitting: Medical Oncology

## 2017-07-20 DIAGNOSIS — I509 Heart failure, unspecified: Secondary | ICD-10-CM | POA: Diagnosis present

## 2017-07-20 DIAGNOSIS — E039 Hypothyroidism, unspecified: Secondary | ICD-10-CM | POA: Diagnosis present

## 2017-07-20 DIAGNOSIS — Z885 Allergy status to narcotic agent status: Secondary | ICD-10-CM | POA: Diagnosis not present

## 2017-07-20 DIAGNOSIS — Z85828 Personal history of other malignant neoplasm of skin: Secondary | ICD-10-CM

## 2017-07-20 DIAGNOSIS — Z981 Arthrodesis status: Secondary | ICD-10-CM | POA: Diagnosis not present

## 2017-07-20 DIAGNOSIS — M546 Pain in thoracic spine: Secondary | ICD-10-CM | POA: Diagnosis present

## 2017-07-20 DIAGNOSIS — Z888 Allergy status to other drugs, medicaments and biological substances status: Secondary | ICD-10-CM

## 2017-07-20 DIAGNOSIS — Z9049 Acquired absence of other specified parts of digestive tract: Secondary | ICD-10-CM

## 2017-07-20 DIAGNOSIS — M542 Cervicalgia: Secondary | ICD-10-CM | POA: Diagnosis present

## 2017-07-20 DIAGNOSIS — E785 Hyperlipidemia, unspecified: Secondary | ICD-10-CM | POA: Diagnosis present

## 2017-07-20 DIAGNOSIS — Z808 Family history of malignant neoplasm of other organs or systems: Secondary | ICD-10-CM

## 2017-07-20 DIAGNOSIS — K7689 Other specified diseases of liver: Secondary | ICD-10-CM | POA: Diagnosis present

## 2017-07-20 DIAGNOSIS — G43909 Migraine, unspecified, not intractable, without status migrainosus: Secondary | ICD-10-CM | POA: Diagnosis present

## 2017-07-20 DIAGNOSIS — Z801 Family history of malignant neoplasm of trachea, bronchus and lung: Secondary | ICD-10-CM | POA: Diagnosis not present

## 2017-07-20 DIAGNOSIS — M1990 Unspecified osteoarthritis, unspecified site: Secondary | ICD-10-CM | POA: Diagnosis present

## 2017-07-20 DIAGNOSIS — R7881 Bacteremia: Secondary | ICD-10-CM | POA: Diagnosis present

## 2017-07-20 DIAGNOSIS — Z86010 Personal history of colonic polyps: Secondary | ICD-10-CM

## 2017-07-20 DIAGNOSIS — I110 Hypertensive heart disease with heart failure: Secondary | ICD-10-CM | POA: Diagnosis present

## 2017-07-20 DIAGNOSIS — F419 Anxiety disorder, unspecified: Secondary | ICD-10-CM | POA: Diagnosis present

## 2017-07-20 DIAGNOSIS — R509 Fever, unspecified: Secondary | ICD-10-CM

## 2017-07-20 DIAGNOSIS — R519 Headache, unspecified: Secondary | ICD-10-CM

## 2017-07-20 DIAGNOSIS — M810 Age-related osteoporosis without current pathological fracture: Secondary | ICD-10-CM | POA: Diagnosis present

## 2017-07-20 DIAGNOSIS — Z792 Long term (current) use of antibiotics: Secondary | ICD-10-CM | POA: Diagnosis not present

## 2017-07-20 DIAGNOSIS — K210 Gastro-esophageal reflux disease with esophagitis: Secondary | ICD-10-CM

## 2017-07-20 DIAGNOSIS — B9689 Other specified bacterial agents as the cause of diseases classified elsewhere: Secondary | ICD-10-CM | POA: Diagnosis present

## 2017-07-20 DIAGNOSIS — W57XXXA Bitten or stung by nonvenomous insect and other nonvenomous arthropods, initial encounter: Secondary | ICD-10-CM | POA: Diagnosis present

## 2017-07-20 DIAGNOSIS — Z800 Family history of malignant neoplasm of digestive organs: Secondary | ICD-10-CM

## 2017-07-20 DIAGNOSIS — R079 Chest pain, unspecified: Secondary | ICD-10-CM

## 2017-07-20 DIAGNOSIS — K219 Gastro-esophageal reflux disease without esophagitis: Secondary | ICD-10-CM | POA: Diagnosis present

## 2017-07-20 DIAGNOSIS — Z79890 Hormone replacement therapy (postmenopausal): Secondary | ICD-10-CM | POA: Diagnosis not present

## 2017-07-20 DIAGNOSIS — Z79899 Other long term (current) drug therapy: Secondary | ICD-10-CM | POA: Diagnosis not present

## 2017-07-20 DIAGNOSIS — R51 Headache: Secondary | ICD-10-CM

## 2017-07-20 DIAGNOSIS — E538 Deficiency of other specified B group vitamins: Secondary | ICD-10-CM | POA: Diagnosis present

## 2017-07-20 HISTORY — DX: Heart failure, unspecified: I50.9

## 2017-07-20 LAB — BLOOD CULTURE ID PANEL (REFLEXED)
ACINETOBACTER BAUMANNII: NOT DETECTED
CANDIDA ALBICANS: NOT DETECTED
CANDIDA GLABRATA: NOT DETECTED
Candida krusei: NOT DETECTED
Candida parapsilosis: NOT DETECTED
Candida tropicalis: NOT DETECTED
ENTEROBACTER CLOACAE COMPLEX: NOT DETECTED
ENTEROBACTERIACEAE SPECIES: NOT DETECTED
ENTEROCOCCUS SPECIES: NOT DETECTED
ESCHERICHIA COLI: NOT DETECTED
Haemophilus influenzae: NOT DETECTED
Klebsiella oxytoca: NOT DETECTED
Klebsiella pneumoniae: NOT DETECTED
LISTERIA MONOCYTOGENES: NOT DETECTED
Neisseria meningitidis: NOT DETECTED
PSEUDOMONAS AERUGINOSA: NOT DETECTED
Proteus species: NOT DETECTED
STAPHYLOCOCCUS SPECIES: NOT DETECTED
STREPTOCOCCUS AGALACTIAE: NOT DETECTED
STREPTOCOCCUS PNEUMONIAE: NOT DETECTED
STREPTOCOCCUS PYOGENES: NOT DETECTED
Serratia marcescens: NOT DETECTED
Staphylococcus aureus (BCID): NOT DETECTED
Streptococcus species: NOT DETECTED

## 2017-07-20 LAB — COMPREHENSIVE METABOLIC PANEL
ALBUMIN: 4.4 g/dL (ref 3.5–5.0)
ALK PHOS: 99 U/L (ref 38–126)
ALT: 145 U/L — ABNORMAL HIGH (ref 14–54)
ANION GAP: 12 (ref 5–15)
AST: 147 U/L — ABNORMAL HIGH (ref 15–41)
BUN: 13 mg/dL (ref 6–20)
CALCIUM: 9.6 mg/dL (ref 8.9–10.3)
CO2: 23 mmol/L (ref 22–32)
Chloride: 103 mmol/L (ref 101–111)
Creatinine, Ser: 0.66 mg/dL (ref 0.44–1.00)
GFR calc non Af Amer: >60 mL/min (ref >60)
Glucose, Bld: 140 mg/dL — ABNORMAL HIGH (ref 65–99)
POTASSIUM: 3.6 mmol/L (ref 3.5–5.1)
SODIUM: 138 mmol/L (ref 135–145)
Total Bilirubin: 0.6 mg/dL (ref 0.3–1.2)
Total Protein: 8 g/dL (ref 6.5–8.1)

## 2017-07-20 LAB — CBC WITH DIFFERENTIAL/PLATELET
Basophils Absolute: 0 10*3/uL (ref 0–0.1)
Basophils Relative: 1 %
EOS ABS: 0.1 10*3/uL (ref 0–0.7)
EOS PCT: 1 %
HCT: 43.3 % (ref 35.0–47.0)
HEMOGLOBIN: 14.3 g/dL (ref 12.0–16.0)
LYMPHS PCT: 27 %
Lymphs Abs: 1.5 10*3/uL (ref 1.0–3.6)
MCH: 27.4 pg (ref 26.0–34.0)
MCHC: 33 g/dL (ref 32.0–36.0)
MCV: 83 fL (ref 80.0–100.0)
Monocytes Absolute: 0.8 10*3/uL (ref 0.2–0.9)
Monocytes Relative: 15 %
NEUTROS PCT: 56 %
Neutro Abs: 3.1 10*3/uL (ref 1.4–6.5)
PLATELETS: 291 10*3/uL (ref 150–440)
RBC: 5.22 MIL/uL — AB (ref 3.80–5.20)
RDW: 14.6 % — ABNORMAL HIGH (ref 11.5–14.5)
WBC: 5.5 10*3/uL (ref 3.6–11.0)

## 2017-07-20 LAB — URINALYSIS, COMPLETE (UACMP) WITH MICROSCOPIC
BILIRUBIN URINE: NEGATIVE
Bacteria, UA: NONE SEEN
GLUCOSE, UA: NEGATIVE mg/dL
Hgb urine dipstick: NEGATIVE
Ketones, ur: NEGATIVE mg/dL
Leukocytes, UA: NEGATIVE
Nitrite: NEGATIVE
PH: 5 (ref 5.0–8.0)
Protein, ur: 30 mg/dL — AB
SPECIFIC GRAVITY, URINE: 1.019 (ref 1.005–1.030)

## 2017-07-20 LAB — HIV ANTIBODY (ROUTINE TESTING W REFLEX): HIV SCREEN 4TH GENERATION: NONREACTIVE

## 2017-07-20 LAB — LACTIC ACID, PLASMA
Lactic Acid, Venous: 1.2 mmol/L (ref 0.5–1.9)
Lactic Acid, Venous: 0.9 mmol/L (ref 0.5–1.9)

## 2017-07-20 MED ORDER — CELECOXIB 100 MG PO CAPS
100.0000 mg | ORAL_CAPSULE | Freq: Two times a day (BID) | ORAL | Status: DC
  Administered 2017-07-20 – 2017-07-22 (×3): 100 mg via ORAL
  Filled 2017-07-20 (×5): qty 1

## 2017-07-20 MED ORDER — CYCLOBENZAPRINE HCL 10 MG PO TABS
10.0000 mg | ORAL_TABLET | Freq: Every day | ORAL | Status: DC
  Administered 2017-07-20 – 2017-07-21 (×2): 10 mg via ORAL
  Filled 2017-07-20 (×2): qty 1

## 2017-07-20 MED ORDER — FLUTICASONE PROPIONATE 50 MCG/ACT NA SUSP
1.0000 | Freq: Every day | NASAL | Status: DC | PRN
  Filled 2017-07-20: qty 16

## 2017-07-20 MED ORDER — SODIUM CHLORIDE 0.9 % IV SOLN
1.0000 g | Freq: Once | INTRAVENOUS | Status: DC
  Filled 2017-07-20: qty 1

## 2017-07-20 MED ORDER — ONDANSETRON HCL 4 MG PO TABS: 4.0000 mg | ORAL_TABLET | Freq: Four times a day (QID) | ORAL | Status: DC | PRN

## 2017-07-20 MED ORDER — IOPAMIDOL (ISOVUE-300) INJECTION 61%
15.0000 mL | INTRAVENOUS | Status: AC
  Administered 2017-07-21 (×2): 15 mL via ORAL

## 2017-07-20 MED ORDER — IOPAMIDOL (ISOVUE-300) INJECTION 61%: 30.0000 mL | Freq: Once | INTRAVENOUS | Status: DC

## 2017-07-20 MED ORDER — ONDANSETRON HCL 4 MG/2ML IJ SOLN: 4.0000 mg | Freq: Four times a day (QID) | INTRAMUSCULAR | Status: DC | PRN

## 2017-07-20 MED ORDER — SODIUM CHLORIDE 0.9 % IV SOLN: 1.0000 g | Freq: Once | INTRAVENOUS | Status: DC

## 2017-07-20 MED ORDER — CALCIUM CARBONATE 1250 (500 CA) MG PO TABS: 1.0000 | ORAL_TABLET | Freq: Every day | ORAL | Status: DC

## 2017-07-20 MED ORDER — CALCIUM CARBONATE ANTACID 500 MG PO CHEW
500.0000 mg | CHEWABLE_TABLET | Freq: Every day | ORAL | Status: DC
  Administered 2017-07-22: 500 mg via ORAL
  Filled 2017-07-20: qty 3

## 2017-07-20 MED ORDER — MAGNESIUM OXIDE 400 (241.3 MG) MG PO TABS
400.0000 mg | ORAL_TABLET | Freq: Every day | ORAL | Status: DC
  Administered 2017-07-22: 400 mg via ORAL
  Filled 2017-07-20: qty 1

## 2017-07-20 MED ORDER — ADULT MULTIVITAMIN W/MINERALS CH
1.0000 | ORAL_TABLET | Freq: Every day | ORAL | Status: DC
  Administered 2017-07-22: 1 via ORAL
  Filled 2017-07-20: qty 1

## 2017-07-20 MED ORDER — ACETAMINOPHEN 325 MG PO TABS
650.0000 mg | ORAL_TABLET | Freq: Four times a day (QID) | ORAL | Status: DC | PRN
  Administered 2017-07-20 – 2017-07-21 (×2): 650 mg via ORAL
  Filled 2017-07-20 (×2): qty 2

## 2017-07-20 MED ORDER — LEVOTHYROXINE SODIUM 112 MCG PO TABS
112.0000 ug | ORAL_TABLET | Freq: Every day | ORAL | Status: DC
  Administered 2017-07-22: 112 ug via ORAL
  Filled 2017-07-20 (×2): qty 1

## 2017-07-20 MED ORDER — BUTALBITAL-APAP-CAFFEINE 50-325-40 MG PO TABS
1.0000 | ORAL_TABLET | Freq: Four times a day (QID) | ORAL | Status: DC | PRN
  Filled 2017-07-20: qty 1

## 2017-07-20 MED ORDER — SODIUM CHLORIDE 0.9 % IV SOLN: 1.0000 g | Freq: Three times a day (TID) | INTRAVENOUS | Status: DC

## 2017-07-20 MED ORDER — VITAMIN B-12 1000 MCG PO TABS
1000.0000 ug | ORAL_TABLET | Freq: Every day | ORAL | Status: DC
  Administered 2017-07-22: 1000 ug via ORAL
  Filled 2017-07-20: qty 1

## 2017-07-20 MED ORDER — ENOXAPARIN SODIUM 40 MG/0.4ML ~~LOC~~ SOLN
40.0000 mg | SUBCUTANEOUS | Status: DC
  Administered 2017-07-20 – 2017-07-21 (×2): 40 mg via SUBCUTANEOUS
  Filled 2017-07-20 (×2): qty 0.4

## 2017-07-20 MED ORDER — DICLOFENAC SODIUM 1 % TD GEL: 2.0000 g | TRANSDERMAL | Status: DC

## 2017-07-20 MED ORDER — IBUPROFEN 400 MG PO TABS: 600.0000 mg | ORAL_TABLET | Freq: Four times a day (QID) | ORAL | Status: DC | PRN

## 2017-07-20 MED ORDER — B COMPLEX-C PO TABS
1.0000 | ORAL_TABLET | Freq: Every day | ORAL | Status: DC
  Administered 2017-07-22: 1 via ORAL
  Filled 2017-07-20 (×2): qty 1

## 2017-07-20 MED ORDER — SODIUM CHLORIDE 0.9 % IV SOLN
INTRAVENOUS | Status: DC
  Administered 2017-07-20 – 2017-07-22 (×3): via INTRAVENOUS

## 2017-07-20 MED ORDER — SODIUM CHLORIDE 0.9 % IV SOLN
2.0000 g | Freq: Two times a day (BID) | INTRAVENOUS | Status: DC
  Administered 2017-07-20 – 2017-07-21 (×3): 2 g via INTRAVENOUS
  Filled 2017-07-20 (×4): qty 20

## 2017-07-20 MED ORDER — SODIUM CHLORIDE 0.9 % IV SOLN
100.0000 mg | Freq: Two times a day (BID) | INTRAVENOUS | Status: DC
  Administered 2017-07-20 – 2017-07-22 (×4): 100 mg via INTRAVENOUS
  Filled 2017-07-20 (×5): qty 100

## 2017-07-20 MED ORDER — ACETAMINOPHEN 650 MG RE SUPP: 650.0000 mg | Freq: Four times a day (QID) | RECTAL | Status: DC | PRN

## 2017-07-20 MED ORDER — MONTELUKAST SODIUM 10 MG PO TABS
10.0000 mg | ORAL_TABLET | Freq: Every day | ORAL | Status: DC
Start: 2017-07-20 — End: 2017-07-22
  Administered 2017-07-20 – 2017-07-21 (×2): 10 mg via ORAL
  Filled 2017-07-20 (×2): qty 1

## 2017-07-20 NOTE — Progress Notes (Signed)
PHARMACY - PHYSICIAN COMMUNICATION CRITICAL VALUE ALERT - BLOOD CULTURE IDENTIFICATION (BCID)  Shaneca Orne Danish is an 67 y.o. female who presented to [facility] on 07/19/2017 with a chief complaint of Fever, w/ head and neck pain, also complaining of photophobia was bit by tick in january  Assessment: Not meeting any SIRS criteria, WBC 6.2, CT shows no acute abnormalities;  1/4 bottles GNR, BCID none detected  Name of physician (or Provider) Contacted: Marjean Donna  Current antibiotics: discharged w/ doxycycline 100 mg BID  Changes to prescribed antibiotics recommended:  Patient was discharged w/ doxycyline; however, considering constellation of symptoms, patient may need LP, MD contacted and will follow up w/ patient.  No results found for this or any previous visit.  Tobie Lords 07/20/2017  1:59 AM

## 2017-07-20 NOTE — H&P (Signed)
[facility] [place] NAME: Taylor Hunt    MR#:  [id number]  DATE OF BIRTH:  1951/01/06  DATE OF ADMISSION:  07/20/2017  PRIMARY CARE PHYSICIAN: Rusty Aus, MD   REQUESTING/REFERRING PHYSICIAN: Dr Harvest Dark  CHIEF COMPLAINT:   Chief Complaint  Patient presents with  . Abnormal Lab    HISTORY OF PRESENT ILLNESS:  Taylor Hunt  is a 67 y.o. female coming back in with positive blood culture.  Patient states that Friday evening she went to a church function.  Around 715 her head was hurting and had a terrible headache and some neck pain.  She was hot weak and sweaty.  She had a temperature 101.8.  She has been taking Tylenol, Aleve and Celebrex.  She is not feeling well she has had decreased energy and very thirsty.  She went to see Dr. Emily Filbert yesterday and he suspected meningitis and was referred into the ED.  The ER physician recommended a lumbar puncture and the patient refused.  The patient was given doxycycline and felt better as of today.  Positive blood culture gram-negative rod in 1 blood culture bottle from yesterday.  Hospitalist services were contacted for further evaluation.  Patient was also having some abdominal pain and vomited this morning.  liver function test are higher than yesterday.  Neck pain and headache are much improved today.  PAST MEDICAL HISTORY:   Past Medical History:  Diagnosis Date  . Anemia   . Anxiety   . Arthritis   . Cancer ([place])    skin  . GERD (gastroesophageal reflux disease)   . Headache   . Heart murmur   . Hyperlipemia   . Hypothyroidism   . Pneumonia   . PONV (postoperative nausea and vomiting)     PAST SURGICAL HISTORY:   Past Surgical History:  Procedure Laterality Date  . ANTERIOR CERVICAL DECOMP/DISCECTOMY FUSION N/A 09/18/2015   Procedure: ANTERIOR CERVICAL DECOMPRESSION/DISCECTOMY INTERBODY FUSION PLATING BONEGRAFT CERVICAL FIVE-SIX ,CERVICAL SIX-SEVEN ;  Surgeon:  Newman Pies, MD;  Location: [facility];  Service: Neurosurgery;  Laterality: N/A;  . APPENDECTOMY    . both shoulder surgery    . BREAST BIOPSY Right    negative 2000 x 2   . CHOLECYSTECTOMY    . COLONOSCOPY    . EYE SURGERY    . OOPHORECTOMY    . right knee surgery    . skin cancer removed    . TONSILLECTOMY      SOCIAL HISTORY:   Social History   Tobacco Use  . Smoking status: Never Smoker  . Smokeless tobacco: Never Used  Substance Use Topics  . Alcohol use: Yes    Comment: occ social    FAMILY HISTORY:   Family History  Problem Relation Age of Onset  . Pancreatic cancer Mother   . Lung cancer Father   . Pancreatic cancer Maternal Uncle   . Pancreatic cancer Brother   . Melanoma Maternal Grandfather   . Breast cancer Neg Hx     DRUG ALLERGIES:   Allergies  Allergen Reactions  . Fentanyl Nausea And Vomiting  . Codeine Other (See Comments)    Dizziness, nausea, vomiting  . Hydrocodone Nausea And Vomiting  . Lovastatin Other (See Comments)    Muscle pain  . Morphine Hives  . Tramadol Nausea And Vomiting    REVIEW OF SYSTEMS:  CONSTITUTIONAL: Positive for fever, chills and sweats.  Positive for fatigue and weakness.  EYES: No blurred or double vision.  Light bothers her eyes. EARS, NOSE, AND THROAT: No tinnitus or ear pain. No sore throat RESPIRATORY: No cough, shortness of breath, wheezing or hemoptysis.  CARDIOVASCULAR: No chest pain, orthopnea, edema.  GASTROINTESTINAL:  positive for nausea, vomiting, and abdominal pain. No blood in bowel movements GENITOURINARY: No dysuria, hematuria.  ENDOCRINE: No polyuria, nocturia,  HEMATOLOGY: No anemia, easy bruising or bleeding SKIN: No rash or lesion. MUSCULOSKELETAL: No joint pain or arthritis.   NEUROLOGIC: No tingling, numbness, weakness.  PSYCHIATRY: No anxiety or depression.   MEDICATIONS AT HOME:   Prior to Admission medications   Medication Sig Start Date End Date Taking? Authorizing  Provider  acetaminophen (TYLENOL) 325 MG tablet Take 650 mg by mouth every 6 (six) hours as needed for moderate pain or headache.    [provider]  B Complex-C (B-COMPLEX WITH VITAMIN C) tablet Take 1 tablet by mouth daily.    [provider]  butalbital-acetaminophen-caffeine (FIORICET, ESGIC) [phone] MG tablet Take 1-2 tablets by mouth every 6 (six) hours as needed for headache. 07/19/17 07/19/18  Earleen Newport, MD  calcium carbonate (OS-CAL - DOSED IN MG OF ELEMENTAL CALCIUM) 1250 (500 Ca) MG tablet Take 1 tablet by mouth daily with breakfast.    [provider]  celecoxib (CELEBREX) 200 MG capsule TAKE 1 CAPSULE (200 MG TOTAL) BY MOUTH 2 (TWO) TIMES A DAY. 01/22/14   [provider]  cyclobenzaprine (FLEXERIL) 10 MG tablet Take 10 mg by mouth at bedtime.    [provider]  diclofenac sodium (VOLTAREN) 1 % GEL Apply 2 g topically as directed.  04/20/16   [provider]  doxycycline (VIBRAMYCIN) 100 MG capsule Take 1 capsule (100 mg total) by mouth 2 (two) times daily. 07/19/17   Earleen Newport, MD  fluticasone (FLONASE) 50 MCG/ACT nasal spray Place 1 spray into both nostrils daily as needed for allergies or rhinitis.    [provider]  levothyroxine (SYNTHROID, LEVOTHROID) 112 MCG tablet Take 112 mcg by mouth daily before breakfast.    [provider]  magnesium oxide (MAG-OX) 400 MG tablet Take 400 mg by mouth daily.    [provider]  montelukast (SINGULAIR) 10 MG tablet TAKE ONE TABLET BY MOUTH EVERY NIGHT AT BEDTIME 06/30/13   [provider]  Multiple Vitamin (MULTIVITAMIN WITH MINERALS) TABS tablet Take 1 tablet by mouth daily.    [provider]  vitamin B-12 (CYANOCOBALAMIN) 1000 MCG tablet Take 1,000 mcg by mouth daily.     [provider]  zoledronic acid (RECLAST) 5 MG/100ML SOLN injection Inject 5 mg into the vein. 07/13/13   [provider]      VITAL  SIGNS:  Blood pressure (!) 155/90, pulse (!) 119, temperature 98.1 F (36.7 C), temperature source Oral, resp. rate 18, height 5\' 4"  (1.626 m), weight 66.7 kg (147 lb), SpO2 99 %.  PHYSICAL EXAMINATION:  GENERAL:  67 y.o.-year-old patient lying in the bed with no acute distress.  EYES: Pupils equal, round, reactive to light and accommodation. No scleral icterus. Extraocular muscles intact.  HEENT: Head atraumatic, normocephalic. Oropharynx and nasopharynx clear.  NECK:  Supple, no jugular venous distention. No thyroid enlargement, no tenderness.  Good range of motion neck. LUNGS: Normal breath sounds bilaterally, no wheezing, rales,rhonchi or crepitation. No use of accessory muscles of respiration.  CARDIOVASCULAR: S1, S2 tachycardia . No murmurs, rubs, or gallops.  ABDOMEN: Soft, nontender, nondistended. Bowel sounds present. No organomegaly or mass.  EXTREMITIES: No pedal edema, cyanosis, or clubbing.  NEUROLOGIC: Cranial nerves II through XII are intact. Muscle strength 5/5 in all extremities. Sensation intact. Gait not checked.  PSYCHIATRIC: The patient is alert and oriented x 3.  SKIN: No rash, lesion, or ulcer.   LABORATORY PANEL:   CBC Recent Labs  Lab 07/20/17 1800  WBC 5.5  HGB 14.3  HCT 43.3  PLT 291   ------------------------------------------------------------------------------------------------------------------  Chemistries  Recent Labs  Lab 07/20/17 1800  NA 138  K 3.6  CL 103  CO2 23  GLUCOSE 140*  BUN 13  CREATININE 0.66  CALCIUM 9.6  AST 147*  ALT 145*  ALKPHOS 99  BILITOT 0.6   ------------------------------------------------------------------------------------------------------------------  --  RADIOLOGY:  Ct Head Wo Contrast  Result Date: 07/19/2017 CLINICAL DATA:  67 year old female with history of fever to 102 degrees since Friday. Severe head and neck pain. EXAM: CT HEAD WITHOUT CONTRAST TECHNIQUE: Contiguous axial images were obtained  from the base of the skull through the vertex without intravenous contrast. COMPARISON:  Head CT 12/21/2009. FINDINGS: Brain: No evidence of acute infarction, hemorrhage, hydrocephalus, extra-axial collection or mass lesion/mass effect. Vascular: No hyperdense vessel or unexpected calcification. Skull: Normal. Negative for fracture or focal lesion. Sinuses/Orbits: No acute finding. Other: None. IMPRESSION: 1. No acute intracranial abnormalities. The appearance of the brain is normal. Electronically Signed   By: Vinnie Langton M.D.   On: 07/19/2017 12:48      IMPRESSION AND PLAN:   1.  Bacteremia with gram-negative rod and suspected meningitis with headache and neck pain.  Case discussed with pharmacist and we will give Rocephin 2 g IV every 12 hours and doxycycline IV.  Infectious disease consultation for tomorrow.  Patient improved from yesterday. 2.  Elevated liver function tests and history of liver cyst.  Patient also had abdominal pain nausea vomiting.  I will get a CT scan of the abdomen in the morning. 3.  Hypothyroidism unspecified on Synthroid 4.  Hyperlipidemia unspecified   I have reviewed laboratory data.   Management plans discussed with the patient, and she is in agreement.  CODE STATUS: Full code  TOTAL TIME TAKING CARE OF THIS PATIENT: 50 minutes.    Loletha Grayer M.D on 07/20/2017 at 7:55 PM  Between 7am to 6pm - Pager - [phone]  After 6pm call admission pager [phone]  [facility]  [phone]  CC: Primary care physician; Rusty Aus, MD

## 2017-07-20 NOTE — Progress Notes (Signed)
Pharmacy Antibiotic Note  Taylor Hunt is a 67 y.o. female admitted on 07/20/2017 with GNR in 1/4 blood cultures and r/o meninitis. Patient also has h/o recent tick bite and was seen in ED yesterday but refused LP. Pharmacy has been consulted for ceftriaxone dosing. Patient is also ordered doxycycline.   Plan: Ceftriaxone 2 g iv q 12 hours.   Height: 5\' 4"  (162.6 cm) Weight: 147 lb (66.7 kg) IBW/kg (Calculated) : 54.7  Temp (24hrs), Avg:98.1 F (36.7 C), Min:98.1 F (36.7 C), Max:98.1 F (36.7 C)  Recent Labs  Lab 07/19/17 1203 07/20/17 1800  WBC 6.2 5.5  CREATININE 0.66 0.66  LATICACIDVEN  --  1.2    Estimated Creatinine Clearance: 63.2 mL/min (by C-G formula based on SCr of 0.66 mg/dL).    Allergies  Allergen Reactions  . Fentanyl Nausea And Vomiting  . Codeine Other (See Comments)    Dizziness, nausea, vomiting  . Hydrocodone Nausea And Vomiting  . Lovastatin Other (See Comments)    Muscle pain  . Morphine Hives  . Tramadol Nausea And Vomiting    Antimicrobials this admission: doxycycline 3/11 >>  Ceftriaxone 3/12 >>   Dose adjustments this admission:   Microbiology results: 3/11 BCx: GNR w/ no ID on BCID 3/12 BCx: sent  Thank you for allowing pharmacy to be a part of this patient's care.  Napoleon Form 07/20/2017 7:54 PM

## 2017-07-20 NOTE — ED Provider Notes (Signed)
[facility] Emergency Department Provider Note  Time seen: 7:00 PM  I have reviewed the triage vital signs and the nursing notes.   HISTORY  Chief Complaint Abnormal Lab    HPI TAKESHIA Hunt is a 67 y.o. female with a past medical history of anemia, gastritis, hyperlipidemia, presents to the emergency department for a positive blood culture.  According to the patient 4 days ago she developed a sudden onset significant headache which she rates as a 10/10 that developed over the course of approximately an hour.  Later that day the patient developed a fever to 102.7 as well as moderate neck pain.  Patient states she started taking Tylenol for the headache and fever, headache remained present throughout the weekend Saturday and Sunday however the fever was absent since Friday.  Patient went to her primary care doctor Dr. Sabra Heck yesterday and was referred to the emergency department for further evaluation.  Patient was seen in the emergency department yesterday patient admits she was offered a lumbar puncture at least 4 times, but ultimately she declined.  Was given IV antibiotics and was discharged on oral doxycycline.  Patient states she has continued to feel bad yesterday, states she woke very nauseated this morning with one episode of vomiting and was feeling very weak.  She received a phone call today letting her know of her positive blood culture and to return to the emergency department.  Patient states around 3 PM however she began feeling dramatically better.  States her current headache is a 3/10 which is much improved from the 10/10 she had been experiencing prior to this afternoon.  States the neck pain is almost gone.  Has not had a fever since Friday.  Denies any dysuria or diarrhea.   Past Medical History:  Diagnosis Date  . Anemia   . Anxiety   . Arthritis   . Cancer ([place])    skin  . GERD (gastroesophageal reflux disease)   . Headache   . Heart murmur   .  Hyperlipemia   . Hypothyroidism   . Pneumonia   . PONV (postoperative nausea and vomiting)     Patient Active Problem List   Diagnosis Date Noted  . Gastro-esophageal reflux disease with esophagitis 06/28/2017  . Adult idiopathic generalized osteoporosis 04/20/2016  . History of SCC (squamous cell carcinoma) of skin 04/20/2016  . Cervical radiculopathy 09/18/2015  . B12 deficiency 03/27/2015  . Cervical disc disease 03/28/2014  . 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 Hx of adenomatous colonic polyps 10/26/2013  . OA (osteoarthritis) 09/22/2013  . Osteoporosis 07/03/2013  . Acquired hypothyroidism 08/26/2012  . Cramps, muscle, general 08/26/2012  . Carpal tunnel syndrome 04/04/2012  . Allergic rhinitis 03/24/2012  . Hypercholesteremia 03/24/2012  . Squamous cell carcinoma of skin 03/24/2012  . Vitamin D deficiency 03/24/2012    Past Surgical History:  Procedure Laterality Date  . ANTERIOR CERVICAL DECOMP/DISCECTOMY FUSION N/A 09/18/2015   Procedure: ANTERIOR CERVICAL DECOMPRESSION/DISCECTOMY INTERBODY FUSION PLATING BONEGRAFT CERVICAL FIVE-SIX ,CERVICAL SIX-SEVEN ;  Surgeon: Newman Pies, MD;  Location: [facility];  Service: Neurosurgery;  Laterality: N/A;  . APPENDECTOMY    . both shoulder surgery    . BREAST BIOPSY Right    negative 2000 x 2   . CHOLECYSTECTOMY    . COLONOSCOPY    . EYE SURGERY    . OOPHORECTOMY    . right knee surgery    . skin cancer removed    . TONSILLECTOMY  Prior to Admission medications   Medication Sig Start Date End Date Taking? Authorizing Provider  acetaminophen (TYLENOL) 325 MG tablet Take 650 mg by mouth every 6 (six) hours as needed for moderate pain or headache.    [provider]  B Complex-C (B-COMPLEX WITH VITAMIN C) tablet Take 1 tablet by mouth daily.    [provider]  butalbital-acetaminophen-caffeine (FIORICET, ESGIC) [phone] MG tablet Take 1-2 tablets by mouth every 6 (six) hours as needed  for headache. 07/19/17 07/19/18  Earleen Newport, MD  calcium carbonate (OS-CAL - DOSED IN MG OF ELEMENTAL CALCIUM) 1250 (500 Ca) MG tablet Take 1 tablet by mouth daily with breakfast.    [provider]  celecoxib (CELEBREX) 200 MG capsule TAKE 1 CAPSULE (200 MG TOTAL) BY MOUTH 2 (TWO) TIMES A DAY. 01/22/14   [provider]  cyclobenzaprine (FLEXERIL) 10 MG tablet Take 10 mg by mouth at bedtime.    [provider]  diclofenac sodium (VOLTAREN) 1 % GEL Apply 2 g topically as directed.  04/20/16   [provider]  doxycycline (VIBRAMYCIN) 100 MG capsule Take 1 capsule (100 mg total) by mouth 2 (two) times daily. 07/19/17   Earleen Newport, MD  fluticasone (FLONASE) 50 MCG/ACT nasal spray Place 1 spray into both nostrils daily as needed for allergies or rhinitis.    [provider]  levothyroxine (SYNTHROID, LEVOTHROID) 112 MCG tablet Take 112 mcg by mouth daily before breakfast.    [provider]  magnesium oxide (MAG-OX) 400 MG tablet Take 400 mg by mouth daily.    [provider]  montelukast (SINGULAIR) 10 MG tablet TAKE ONE TABLET BY MOUTH EVERY NIGHT AT BEDTIME 06/30/13   [provider]  Multiple Vitamin (MULTIVITAMIN WITH MINERALS) TABS tablet Take 1 tablet by mouth daily.    [provider]  promethazine (PHENERGAN) 25 MG tablet Take 1 tablet (25 mg total) by mouth every 6 (six) hours as needed for nausea. Patient not taking: Reported on 07/19/2017 09/19/15   Newman Pies, MD  vitamin B-12 (CYANOCOBALAMIN) 1000 MCG tablet Take 1,000 mcg by mouth daily.     [provider]  zoledronic acid (RECLAST) 5 MG/100ML SOLN injection Inject 5 mg into the vein. 07/13/13   [provider]    Allergies  Allergen Reactions  . Fentanyl Nausea And Vomiting  . Codeine Other (See Comments)    Dizziness, nausea, vomiting  . Hydrocodone Nausea And Vomiting  . Lovastatin Other (See Comments)    Muscle  pain  . Morphine Hives  . Tramadol Nausea And Vomiting    Family History  Problem Relation Age of Onset  . Pancreatic cancer Mother   . Lung cancer Father   . Pancreatic cancer Maternal Uncle   . Pancreatic cancer Brother   . Melanoma Maternal Grandfather   . Breast cancer Neg Hx     Social History Social History   Tobacco Use  . Smoking status: Never Smoker  . Smokeless tobacco: Never Used  Substance Use Topics  . Alcohol use: Yes    Comment: occ social  . Drug use: No    Review of Systems Constitutional: Fever to 102.7 on Friday, none since. Eyes: Negative for visual complaints ENT: Negative for recent illness/congestion Cardiovascular: Negative for chest pain. Respiratory: Negative for shortness of breath. Gastrointestinal: Negative for abdominal pain.  One episode of vomiting this morning.  Negative for diarrhea Genitourinary: Negative for dysuria. Musculoskeletal: Patient was having significant neck pain over the  weekend, states it is largely gone today. Skin: Negative for skin complaints  Neurological: Significant headache over the weekend, currently a 3/10. All other ROS negative  ____________________________________________   PHYSICAL EXAM:  VITAL SIGNS: ED Triage Vitals  Enc Vitals Group     BP 07/20/17 1724 (!) 155/90     Pulse Rate 07/20/17 1724 (!) 119     Resp 07/20/17 1724 18     Temp 07/20/17 1724 98.1 F (36.7 C)     Temp Source 07/20/17 1724 Oral     SpO2 07/20/17 1724 99 %     Weight 07/20/17 1724 147 lb (66.7 kg)     Height 07/20/17 1724 5\' 4"  (1.626 m)     Head Circumference --      Peak Flow --      Pain Score 07/20/17 1730 4     Pain Loc --      Pain Edu? --      Excl. in [place]? --     Constitutional: Alert and oriented. Well appearing and in no distress. Eyes: Normal exam ENT   Head: Normocephalic and atraumatic.   Nose: No congestion/rhinnorhea.   Mouth/Throat: Mucous membranes are moist. Cardiovascular: Normal rate,  regular rhythm. No murmur Respiratory: Normal respiratory effort without tachypnea nor retractions. Breath sounds are clear and equal bilaterally. No wheezes/rales/rhonchi. Gastrointestinal: Soft and nontender. No distention. Musculoskeletal: Nontender with normal range of motion in all extremities.  No C-spine tenderness.  No nuchal rigidity.  Patient states mild discomfort with significant flexion of the neck. Neurologic:  Normal speech and language. No gross focal neurologic deficits  Skin:  Skin is warm, dry and intact.  Psychiatric: Mood and affect are normal.  ____________________________________________   INITIAL IMPRESSION / ASSESSMENT AND PLAN / ED COURSE  Pertinent labs & imaging results that were available during my care of the patient were reviewed by me and considered in my medical decision making (see chart for details).  Patient presents to the emergency department with a positive blood culture.  Differential would include contamination, bacteremia.  I discussed with the patient these findings as well as options as far as discharge after repeat cultures and antibiotics versus admission.  Patient would feel much more comfortable being admitted to the hospital.  As the patient's blood culture was positive for gram-negative rod this would make contamination less likely, and would possibly indicate a legitimate bacteremia.  We will cover the patient with meropenem, we will resend cultures we will admit to the hospitalist service for further treatment.  Patient's urinalysis appears normal, labs are reassuring including a normal white blood cell count.  Patient does have mild elevation in her LFTs, completely nontender abdomen, no right upper quadrant tenderness.  Could be viral.  We will start the patient on IV antibiotics and admit to the hospitalist service.  ____________________________________________   FINAL CLINICAL IMPRESSION(S) / ED DIAGNOSES  Possible bacteremia      Harvest Dark, MD 07/20/17 Einar Crow

## 2017-07-20 NOTE — ED Triage Notes (Signed)
Pt was seen here yesterday- sent here by Dr Sabra Heck for possible meningitis. Pt was seen by Dr Jimmye Norman and discharged with doxycycline RX. Pt states that she ran a fever Friday but has not had one since, states that her headache is "actually a little better" but was called back to ER today by Kathlee Nations d/t gram neg rod in her blood culture.

## 2017-07-20 NOTE — Telephone Encounter (Signed)
Night charge nurse called patient early this am and not able to reach. I called her and she is still feeling bad.  Nauseated, weak.  I advised her to come back due to positive blood culture.  Says she has appt with dr Sabra Heck tomorrow am.  I told her she can call the office to see if they can see her today, but advised her that they may also advise to return here.

## 2017-07-21 ENCOUNTER — Inpatient Hospital Stay: Payer: Medicare Other

## 2017-07-21 LAB — CBC
HEMATOCRIT: 35.6 % (ref 35.0–47.0)
Hemoglobin: 12 g/dL (ref 12.0–16.0)
MCH: 27.9 pg (ref 26.0–34.0)
MCHC: 33.6 g/dL (ref 32.0–36.0)
MCV: 83 fL (ref 80.0–100.0)
PLATELETS: 264 10*3/uL (ref 150–440)
RBC: 4.29 MIL/uL (ref 3.80–5.20)
RDW: 14.5 % (ref 11.5–14.5)
WBC: 4.5 10*3/uL (ref 3.6–11.0)

## 2017-07-21 LAB — BASIC METABOLIC PANEL
Anion gap: 8 (ref 5–15)
BUN: 11 mg/dL (ref 6–20)
CHLORIDE: 109 mmol/L (ref 101–111)
CO2: 23 mmol/L (ref 22–32)
CREATININE: 0.48 mg/dL (ref 0.44–1.00)
Calcium: 8.4 mg/dL — ABNORMAL LOW (ref 8.9–10.3)
GFR calc Af Amer: >60 mL/min (ref >60)
GFR calc non Af Amer: >60 mL/min (ref >60)
Glucose, Bld: 103 mg/dL — ABNORMAL HIGH (ref 65–99)
POTASSIUM: 3.7 mmol/L (ref 3.5–5.1)
Sodium: 140 mmol/L (ref 135–145)

## 2017-07-21 LAB — ROCKY MTN SPOTTED FVR ABS PNL(IGG+IGM)
RMSF IGG: POSITIVE — AB
RMSF IGM: 0.16 {index} (ref 0.00–0.89)

## 2017-07-21 LAB — C-REACTIVE PROTEIN: CRP: <0.8 mg/dL (ref <1.0)

## 2017-07-21 LAB — RMSF, IGG, IFA: RMSF, IGG, IFA: 1:128 {titer} — ABNORMAL HIGH

## 2017-07-21 MED ORDER — IOPAMIDOL (ISOVUE-300) INJECTION 61%
100.0000 mL | Freq: Once | INTRAVENOUS | Status: AC | PRN
  Administered 2017-07-21: 100 mL via INTRAVENOUS

## 2017-07-21 MED ORDER — SODIUM CHLORIDE 0.9 % IV SOLN
3.0000 g | Freq: Four times a day (QID) | INTRAVENOUS | Status: DC
  Administered 2017-07-21 – 2017-07-22 (×3): 3 g via INTRAVENOUS
  Filled 2017-07-21 (×6): qty 3

## 2017-07-21 NOTE — Progress Notes (Signed)
[facility] NAME: Taylor Hunt    MR#:  [id number]  DATE OF BIRTH:  1951-01-15  SUBJECTIVE:   Patient referred to the hospital for direct admission due to positive blood cultures. Patient was noted to have gram-negative rods in her blood cultures. Source remains unclear. Patient did complain of a headache which has improved, she denies any abdominal pain, nausea, vomiting or any dysuria.  REVIEW OF SYSTEMS:    Review of Systems  Constitutional: Negative for chills and fever.  HENT: Negative for congestion and tinnitus.   Eyes: Negative for blurred vision and double vision.  Respiratory: Negative for cough, shortness of breath and wheezing.   Cardiovascular: Negative for chest pain, orthopnea and PND.  Gastrointestinal: Negative for abdominal pain, diarrhea, nausea and vomiting.  Genitourinary: Negative for dysuria and hematuria.  Neurological: Positive for headaches. Negative for dizziness, sensory change and focal weakness.  All other systems reviewed and are negative.   Nutrition: Regular Tolerating Diet: yes Tolerating PT: Await Eval.   DRUG ALLERGIES:   Allergies  Allergen Reactions  . Fentanyl Nausea And Vomiting  . Codeine Other (See Comments)    Dizziness, nausea, vomiting  . Hydrocodone Nausea And Vomiting  . Lovastatin Other (See Comments)    Muscle pain  . Morphine Hives  . Tramadol Nausea And Vomiting    VITALS:  Blood pressure (!) 142/82, pulse 84, temperature 98.5 F (36.9 C), temperature source Oral, resp. rate 18, height 5\' 4"  (1.626 m), weight 66.7 kg (147 lb), SpO2 100 %.  PHYSICAL EXAMINATION:   Physical Exam  GENERAL:  67 y.o.-year-old patient lying in bed in no acute distress.  EYES: Pupils equal, round, reactive to light and accommodation. No scleral icterus. Extraocular muscles intact.  HEENT: Head atraumatic, normocephalic. Oropharynx and nasopharynx clear.  NECK:  Supple, no jugular venous  distention. No thyroid enlargement, no tenderness.  LUNGS: Normal breath sounds bilaterally, no wheezing, rales, rhonchi. No use of accessory muscles of respiration.  CARDIOVASCULAR: S1, S2 normal. No murmurs, rubs, or gallops.  ABDOMEN: Soft, nontender, nondistended. Bowel sounds present. No organomegaly or mass.  EXTREMITIES: No cyanosis, clubbing or edema b/l.    NEUROLOGIC: Cranial nerves II through XII are intact. No focal Motor or sensory deficits b/l.   PSYCHIATRIC: The patient is alert and oriented x 3.  SKIN: No obvious rash, lesion, or ulcer.    LABORATORY PANEL:   CBC Recent Labs  Lab 07/21/17 0450  WBC 4.5  HGB 12.0  HCT 35.6  PLT 264   ------------------------------------------------------------------------------------------------------------------  Chemistries  Recent Labs  Lab 07/20/17 1800 07/21/17 0450  NA 138 140  K 3.6 3.7  CL 103 109  CO2 23 23  GLUCOSE 140* 103*  BUN 13 11  CREATININE 0.66 0.48  CALCIUM 9.6 8.4*  AST 147*  --   ALT 145*  --   ALKPHOS 99  --   BILITOT 0.6  --    ------------------------------------------------------------------------------------------------------------------  Cardiac Enzymes No results for input(s): TROPONINI in the last 168 hours. ------------------------------------------------------------------------------------------------------------------  RADIOLOGY:  Ct Abdomen Pelvis W Contrast  Result Date: 07/21/2017 CLINICAL DATA:  Acute abdominal pain generalized with fever. Appendectomy cholecystectomy oophorectomy EXAM: CT ABDOMEN AND PELVIS WITH CONTRAST TECHNIQUE: Multidetector CT imaging of the abdomen and pelvis was performed using the standard protocol following bolus administration of intravenous contrast. CONTRAST:  100 mL Isovue 300 IV COMPARISON:  CT abdomen 06/05/2010 FINDINGS: Lower chest: Lung bases clear. Hepatobiliary: Multiple hepatic cysts appear  benign with progression from the prior study. Negative  for mass lesion. Cholecystectomy. No biliary dilatation. Pancreas: Negative Spleen: Negative Adrenals/Urinary Tract: Negative for renal mass. No renal stone or obstruction. Urinary bladder normal. Stomach/Bowel: Negative for bowel obstruction. Negative for bowel mass or edema. Appendectomy. Vascular/Lymphatic: Mild atherosclerotic disease without aortic aneurysm Reproductive: Normal uterus.  Prior oophorectomy.  No pelvic mass. Other: Negative for free fluid.  Negative for hernia. Musculoskeletal: Grade 1 anterolisthesis L5-S1. Retrolisthesis L2-3. No acute skeletal abnormality. IMPRESSION: No cause for acute abdominal pain identified Numerous hepatic cysts, with progression since 2012 Postop appendectomy and oophorectomy. Electronically Signed   By: Franchot Gallo M.D.   On: 07/21/2017 10:04     ASSESSMENT AND PLAN:   67 year old female with past medical history of essential hypertension, hyperlipidemia, hypothyroidism, GERD, history of CHF, anxiety, osteoarthritis who presented to the hospital due to positive blood cultures.  1. Positive blood cultures/bacteremia-patient was noted to have gram-negative rods in her blood cultures. Source remains unclear. Patient's urinalysis is negative, she has no acute GI symptoms.  -CT abdomen pelvis showing no acute pathology. -Continue IV ceftriaxone, await infectious disease input.  2. History of migraines-continue Fioricet as needed.  3. Osteoarthritis-continue Celebrex.  4. Hypothyroidism-continue Synthroid.   All the records are reviewed and case discussed with Care Management/Social Worker. Management plans discussed with the patient, family and they are in agreement.  CODE STATUS: Full code  DVT Prophylaxis: Lovenox  TOTAL TIME TAKING CARE OF THIS PATIENT: 30 minutes.   POSSIBLE D/C IN 1-2 DAYS, DEPENDING ON CLINICAL CONDITION.   Henreitta Leber M.D on 07/21/2017 at 2:11 PM  Between 7am to 6pm - Pager - [phone]  After 6pm go to  [email or web address] - Proofreader  [facility]  Office  [phone]  CC: Primary care physician; Rusty Aus, MD

## 2017-07-21 NOTE — Progress Notes (Signed)
Taylor Hunt     Reason for Consult: Fever, GNR bacteremia  Referring Physician: Jeronimo Greaves Date of Admission:  07/20/2017   Active Problems:   Bacteremia   HPI: Taylor Hunt is a 67 y.o. female admitted with neck pain headache and fevers.  She reports sxs began with rapid onset neck and HA pain on Friday 3/ 8 while at a church dinner. Felt very hot that night and then all weekend laid around quite ill. Had some nausea as well. Was having sweats.  She went to PCP and was sent to ED where she decline LP but bcx were done and given Doxy. She then developed nv and was called back after [place]. Currently she feels a lot better with less neck pain. She does report that she had a few weeks ago an episode where she had severe upper thoracic back pain and then NV over about 1-2 hours. This resolved and she did not seek care. [place]. No other animal exposures, no travel and no known sick contacts. Had removed a tick about 5 weeks ago. 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   Past Medical History:  Diagnosis Date  . Anemia   . Anxiety   . Arthritis   . Cancer ([place])    skin  . CHF (congestive heart failure) ([place])   . GERD (gastroesophageal reflux Hunt)   . Headache   . Heart murmur   . Hyperlipemia   . Hypothyroidism   . Pneumonia   . PONV (postoperative nausea and vomiting)    Past Surgical History:  Procedure Laterality Date  . ANTERIOR CERVICAL DECOMP/DISCECTOMY FUSION N/A 09/18/2015   Procedure: ANTERIOR CERVICAL DECOMPRESSION/DISCECTOMY INTERBODY FUSION PLATING BONEGRAFT CERVICAL FIVE-SIX ,CERVICAL SIX-SEVEN ;  Surgeon: Newman Pies, MD;  Location: [facility];  Service: Neurosurgery;  Laterality: N/A;  . APPENDECTOMY    . both shoulder surgery    . BREAST BIOPSY Right    negative 2000 x 2   . CHOLECYSTECTOMY    . COLONOSCOPY    . EYE SURGERY    . OOPHORECTOMY    . right knee surgery     . skin cancer removed    . TONSILLECTOMY     Social History   Tobacco Use  . Smoking status: Never Smoker  . Smokeless tobacco: Never Used  Substance Use Topics  . Alcohol use: Yes    Comment: occ social  . Drug use: No   Family History  Problem Relation Age of Onset  . Pancreatic cancer Mother   . Lung cancer Father   . Pancreatic cancer Maternal Uncle   . Pancreatic cancer Brother   . Melanoma Maternal Grandfather   . Breast cancer Neg Hx     Allergies:  Allergies  Allergen Reactions  . Fentanyl Nausea And Vomiting  . Codeine Other (See Comments)    Dizziness, nausea, vomiting  . Hydrocodone Nausea And Vomiting  . Lovastatin Other (See Comments)    Muscle pain  . Morphine Hives  . Tramadol Nausea And Vomiting    Current antibiotics: Antibiotics Given (last 72 hours)    Date/Time Action Medication Dose Rate   07/20/17 2008 New Bag/Given   cefTRIAXone (ROCEPHIN) 2 g in sodium chloride 0.9 % 100 mL IVPB 2 g 200 mL/hr   07/20/17 2044 New Bag/Given   doxycycline (VIBRAMYCIN) 100 mg in sodium chloride 0.9 % 250 mL IVPB 100  mg 125 mL/hr   07/21/17 0815 New Bag/Given   doxycycline (VIBRAMYCIN) 100 mg in sodium chloride 0.9 % 250 mL IVPB 100 mg 125 mL/hr   07/21/17 0815 New Bag/Given   cefTRIAXone (ROCEPHIN) 2 g in sodium chloride 0.9 % 100 mL IVPB 2 g 200 mL/hr      MEDICATIONS: . B-complex with vitamin C  1 tablet Oral Daily  . calcium carbonate  500 mg of elemental calcium Oral Q breakfast  . celecoxib  100 mg Oral BID  . cyclobenzaprine  10 mg Oral QHS  . diclofenac sodium  2 g Topical UD  . enoxaparin (LOVENOX) injection  40 mg Subcutaneous Q24H  . levothyroxine  112 mcg Oral Q0600  . magnesium oxide  400 mg Oral Daily  . montelukast  10 mg Oral QHS  . multivitamin with minerals  1 tablet Oral Daily  . vitamin B-12  1,000 mcg Oral Daily    Review of Systems - 11 systems reviewed and negative per HPI   OBJECTIVE: Temp:  [98 F (36.7 C)-98.5 F  (36.9 C)] 98.5 F (36.9 C) (03/13 1207) Pulse Rate:  [81-119] 84 (03/13 1207) Resp:  [17-20] 18 (03/13 1207) BP: (136-155)/(66-90) 142/82 (03/13 1207) SpO2:  [97 %-100 %] 100 % (03/13 1207) Weight:  [66.7 kg (147 lb)] 66.7 kg (147 lb) (03/12 1724) Physical Exam  Constitutional:  oriented to person, place, and time. appears well-developed and well-nourished. No distress.  HENT: /AT, PERRLA, no scleral icterus Mouth/Throat: Oropharynx is clear and moist. No oropharyngeal exudate.  Cardiovascular: Normal rate, regular rhythm and normal heart sounds. Exam reveals no gallop and no friction rub.  No murmur heard.  Pulmonary/Chest: Effort normal and breath sounds normal. No respiratory distress.  has no wheezes.  Neck = supple, no nuchal rigidity Abdominal: Soft. Bowel sounds are normal.  exhibits no distension. There is no tenderness.  Lymphadenopathy: no cervical adenopathy. No axillary adenopathy Neurological: alert and oriented to person, place, and time.  Skin: Skin is warm and dry. No rash noted. No erythema.  Psychiatric: a normal mood and affect.  behavior is normal.    LABS: Results for orders placed or performed during the hospital encounter of 07/20/17 (from the past 48 hour(s))  Comprehensive metabolic panel     Status: Abnormal   Collection Time: 07/20/17  6:00 PM  Result Value Ref Range   Sodium 138 135 - 145 mmol/L   Potassium 3.6 3.5 - 5.1 mmol/L   Chloride 103 101 - 111 mmol/L   CO2 23 22 - 32 mmol/L   Glucose, Bld 140 (H) 65 - 99 mg/dL   BUN 13 6 - 20 mg/dL   Creatinine, Ser 0.66 0.44 - 1.00 mg/dL   Calcium 9.6 8.9 - 10.3 mg/dL   Total Protein 8.0 6.5 - 8.1 g/dL   Albumin 4.4 3.5 - 5.0 g/dL   AST 147 (H) 15 - 41 U/L   ALT 145 (H) 14 - 54 U/L   Alkaline Phosphatase 99 38 - 126 U/L   Total Bilirubin 0.6 0.3 - 1.2 mg/dL   GFR calc non Af Amer >60 >60 mL/min   GFR calc Af Amer >60 >60 mL/min    Comment: (NOTE) The eGFR has been calculated using the CKD EPI  equation. This calculation has not been validated in all clinical situations. eGFR's persistently <60 mL/min signify possible Chronic Kidney Hunt.    Anion gap 12 5 - 15    Comment: Performed at [facility], [address] [place], [place] [address]  Lactic acid, plasma     Status: None   Collection Time: 07/20/17  6:00 PM  Result Value Ref Range   Lactic Acid, Venous 1.2 0.5 - 1.9 mmol/L    Comment: Performed at [facility], [address] [place], [place] [address]  CBC with Differential     Status: Abnormal   Collection Time: 07/20/17  6:00 PM  Result Value Ref Range   WBC 5.5 3.6 - 11.0 K/uL   RBC 5.22 (H) 3.80 - 5.20 MIL/uL   Hemoglobin 14.3 12.0 - 16.0 g/dL   HCT 43.3 35.0 - 47.0 %   MCV 83.0 80.0 - 100.0 fL   MCH 27.4 26.0 - 34.0 pg   MCHC 33.0 32.0 - 36.0 g/dL   RDW 14.6 (H) 11.5 - 14.5 %   Platelets 291 150 - 440 K/uL   Neutrophils Relative % 56 %   Neutro Abs 3.1 1.4 - 6.5 K/uL   Lymphocytes Relative 27 %   Lymphs Abs 1.5 1.0 - 3.6 K/uL   Monocytes Relative 15 %   Monocytes Absolute 0.8 0.2 - 0.9 K/uL   Eosinophils Relative 1 %   Eosinophils Absolute 0.1 0 - 0.7 K/uL   Basophils Relative 1 %   Basophils Absolute 0.0 0 - 0.1 K/uL    Comment: Performed at [facility], [address] [place], [place] [address]  Culture, blood (Routine x 2)     Status: None (Preliminary result)   Collection Time: 07/20/17  6:00 PM  Result Value Ref Range   Specimen Description BLOOD BLOOD RIGHT HAND    Special Requests      BOTTLES DRAWN AEROBIC AND ANAEROBIC Blood Culture results may not be optimal due to an inadequate volume of blood received in culture bottles   Culture      NO GROWTH < 24 HOURS Performed at [facility], [address] [place], [place] [address]    Report Status PENDING   Urinalysis, Complete w Microscopic     Status: Abnormal   Collection Time: 07/20/17  6:00 PM  Result Value Ref Range   Color, Urine AMBER  (A) YELLOW   APPearance HAZY (A) CLEAR   Specific Gravity, Urine 1.019 1.005 - 1.030   pH 5.0 5.0 - 8.0   Glucose, UA NEGATIVE NEGATIVE mg/dL   Hgb urine dipstick NEGATIVE NEGATIVE   Bilirubin Urine NEGATIVE NEGATIVE   Ketones, ur NEGATIVE NEGATIVE mg/dL   Protein, ur 30 (A) NEGATIVE mg/dL   Nitrite NEGATIVE NEGATIVE   Leukocytes, UA NEGATIVE NEGATIVE   RBC / HPF 0-5 0 - 5 RBC/hpf   WBC, UA 0-5 0 - 5 WBC/hpf   Bacteria, UA NONE SEEN NONE SEEN   Squamous Epithelial / LPF 0-5 (A) NONE SEEN   Mucus PRESENT    Hyaline Casts, UA PRESENT     Comment: Performed at [facility], [address] [place], [place] [address]  Culture, blood (Routine x 2)     Status: None (Preliminary result)   Collection Time: 07/20/17  6:05 PM  Result Value Ref Range   Specimen Description BLOOD RIGHT ANTECUBITAL    Special Requests      BOTTLES DRAWN AEROBIC AND ANAEROBIC Blood Culture results may not be optimal due to an excessive volume of blood received in culture bottles   Culture      NO GROWTH < 24 HOURS Performed at [facility], [address], [place], [place] [address]  Report Status PENDING   Lactic acid, plasma     Status: None   Collection Time: 07/20/17  7:59 PM  Result Value Ref Range   Lactic Acid, Venous 0.9 0.5 - 1.9 mmol/L    Comment: Performed at [facility], [address] [place], [place] [address]  Basic metabolic panel     Status: Abnormal   Collection Time: 07/21/17  4:50 AM  Result Value Ref Range   Sodium 140 135 - 145 mmol/L   Potassium 3.7 3.5 - 5.1 mmol/L   Chloride 109 101 - 111 mmol/L   CO2 23 22 - 32 mmol/L   Glucose, Bld 103 (H) 65 - 99 mg/dL   BUN 11 6 - 20 mg/dL   Creatinine, Ser 0.48 0.44 - 1.00 mg/dL   Calcium 8.4 (L) 8.9 - 10.3 mg/dL   GFR calc non Af Amer >60 >60 mL/min   GFR calc Af Amer >60 >60 mL/min    Comment: (NOTE) The eGFR has been calculated using the CKD EPI equation. This calculation has not been validated in  all clinical situations. eGFR's persistently <60 mL/min signify possible Chronic Kidney Hunt.    Anion gap 8 5 - 15    Comment: Performed at [facility], [address] [place], [place] [address]  CBC     Status: None   Collection Time: 07/21/17  4:50 AM  Result Value Ref Range   WBC 4.5 3.6 - 11.0 K/uL   RBC 4.29 3.80 - 5.20 MIL/uL   Hemoglobin 12.0 12.0 - 16.0 g/dL   HCT 35.6 35.0 - 47.0 %   MCV 83.0 80.0 - 100.0 fL   MCH 27.9 26.0 - 34.0 pg   MCHC 33.6 32.0 - 36.0 g/dL   RDW 14.5 11.5 - 14.5 %   Platelets 264 150 - 440 K/uL    Comment: Performed at [facility], [address] [place], [place] [address]   No components found for: ESR, C REACTIVE PROTEIN MICRO: Recent Results (from the past 720 hour(s))  Culture, blood (routine x 2)     Status: None (Preliminary result)   Collection Time: 07/19/17 12:20 PM  Result Value Ref Range Status   Specimen Description BLOOD RIGHT ARM  Final   Special Requests   Final    BOTTLES DRAWN AEROBIC AND ANAEROBIC Blood Culture adequate volume   Culture  Setup Time   Final    Organism ID to follow GRAM NEGATIVE RODS ANAEROBIC BOTTLE ONLY CRITICAL RESULT CALLED TO, READ BACK BY AND VERIFIED WITH: CALLED Raygen Dahm BESANTI AT 2315 ON 07/19/17 RWW Performed at [facility], [address] [place], [place] [address]    Culture GRAM NEGATIVE RODS  Final   Report Status PENDING  Incomplete  Culture, blood (routine x 2)     Status: None (Preliminary result)   Collection Time: 07/19/17 12:20 PM  Result Value Ref Range Status   Specimen Description BLOOD RAC  Final   Special Requests   Final    BOTTLES DRAWN AEROBIC AND ANAEROBIC Blood Culture adequate volume   Culture   Final    NO GROWTH 2 DAYS Performed at [facility], [address] [place],  [address]    Report Status PENDING  Incomplete  Blood Culture ID Panel (Reflexed)     Status: None   Collection Time: 07/19/17 12:20 PM  Result  Value Ref Range Status   Enterococcus species NOT DETECTED NOT DETECTED Final   Listeria monocytogenes NOT  DETECTED NOT DETECTED Final   Staphylococcus species NOT DETECTED NOT DETECTED Final   Staphylococcus aureus NOT DETECTED NOT DETECTED Final   Streptococcus species NOT DETECTED NOT DETECTED Final   Streptococcus agalactiae NOT DETECTED NOT DETECTED Final   Streptococcus pneumoniae NOT DETECTED NOT DETECTED Final   Streptococcus pyogenes NOT DETECTED NOT DETECTED Final   Acinetobacter baumannii NOT DETECTED NOT DETECTED Final   Enterobacteriaceae species NOT DETECTED NOT DETECTED Final   Enterobacter cloacae complex NOT DETECTED NOT DETECTED Final   Escherichia coli NOT DETECTED NOT DETECTED Final   Klebsiella oxytoca NOT DETECTED NOT DETECTED Final   Klebsiella pneumoniae NOT DETECTED NOT DETECTED Final   Proteus species NOT DETECTED NOT DETECTED Final   Serratia marcescens NOT DETECTED NOT DETECTED Final   Haemophilus influenzae NOT DETECTED NOT DETECTED Final   Neisseria meningitidis NOT DETECTED NOT DETECTED Final   Pseudomonas aeruginosa NOT DETECTED NOT DETECTED Final   Candida albicans NOT DETECTED NOT DETECTED Final   Candida glabrata NOT DETECTED NOT DETECTED Final   Candida krusei NOT DETECTED NOT DETECTED Final   Candida parapsilosis NOT DETECTED NOT DETECTED Final   Candida tropicalis NOT DETECTED NOT DETECTED Final    Comment: Performed at [facility], [address] [place], [place] [address]  Culture, blood (Routine x 2)     Status: None (Preliminary result)   Collection Time: 07/20/17  6:00 PM  Result Value Ref Range Status   Specimen Description BLOOD BLOOD RIGHT HAND  Final   Special Requests   Final    BOTTLES DRAWN AEROBIC AND ANAEROBIC Blood Culture results may not be optimal due to an inadequate volume of blood received in culture bottles   Culture   Final    NO GROWTH < 24 HOURS Performed at [facility], [address]  [place], [place] [address]    Report Status PENDING  Incomplete  Culture, blood (Routine x 2)     Status: None (Preliminary result)   Collection Time: 07/20/17  6:05 PM  Result Value Ref Range Status   Specimen Description BLOOD RIGHT ANTECUBITAL  Final   Special Requests   Final    BOTTLES DRAWN AEROBIC AND ANAEROBIC Blood Culture results may not be optimal due to an excessive volume of blood received in culture bottles   Culture   Final    NO GROWTH < 24 HOURS Performed at [facility], [address] [place], [place] [address]    Report Status PENDING  Incomplete    IMAGING: Ct Head Wo Contrast  Result Date: 07/19/2017 CLINICAL DATA:  67 year old female with history of fever to 102 degrees since Friday. Severe head and neck pain. EXAM: CT HEAD WITHOUT CONTRAST TECHNIQUE: Contiguous axial images were obtained from the base of the skull through the vertex without intravenous contrast. COMPARISON:  Head CT 12/21/2009. FINDINGS: Brain: No evidence of acute infarction, hemorrhage, hydrocephalus, extra-axial collection or mass lesion/mass effect. Vascular: No hyperdense vessel or unexpected calcification. Skull: Normal. Negative for fracture or focal lesion. Sinuses/Orbits: No acute finding. Other: None. IMPRESSION: 1. No acute intracranial abnormalities. The appearance of the brain is normal. Electronically Signed   By: Vinnie Langton M.D.   On: 07/19/2017 12:48   Ct Abdomen Pelvis W Contrast  Result Date: 07/21/2017 CLINICAL DATA:  Acute abdominal pain generalized with fever. Appendectomy cholecystectomy oophorectomy EXAM: CT ABDOMEN AND PELVIS WITH CONTRAST TECHNIQUE: Multidetector CT imaging of the abdomen and pelvis was performed using the standard protocol following bolus administration of  intravenous contrast. CONTRAST:  100 mL Isovue 300 IV COMPARISON:  CT abdomen 06/05/2010 FINDINGS: Lower chest: Lung bases clear. Hepatobiliary: Multiple hepatic cysts appear benign with  progression from the prior study. Negative for mass lesion. Cholecystectomy. No biliary dilatation. Pancreas: Negative Spleen: Negative Adrenals/Urinary Tract: Negative for renal mass. No renal stone or obstruction. Urinary bladder normal. Stomach/Bowel: Negative for bowel obstruction. Negative for bowel mass or edema. Appendectomy. Vascular/Lymphatic: Mild atherosclerotic Hunt without aortic aneurysm Reproductive: Normal uterus.  Prior oophorectomy.  No pelvic mass. Other: Negative for free fluid.  Negative for hernia. Musculoskeletal: Grade 1 anterolisthesis L5-S1. Retrolisthesis L2-3. No acute skeletal abnormality. IMPRESSION: No cause for acute abdominal pain identified Numerous hepatic cysts, with progression since 2012 Postop appendectomy and oophorectomy. Electronically Signed   By: Franchot Gallo M.D.   On: 07/21/2017 10:04   Dg Foot Complete Left  Result Date: 06/29/2017 Please see detailed radiograph report in office note.   Assessment:   ANALIZA COWGER is a 67 y.o. female with severe HA, neck pain and fever over a few days now with gram neg bacteremia. Her wbc has not been elevated, ptls nml and her fevers seem to be improving. She has neg UA, Neg CT abd. LFTs are slightly elevated.  Flu neg, HIV neg, RMSF IGG + IgM neg.  She had an unusual illness several weeks ago with upper back sudden sharp pain and nv for several hours.  She did have a tick bite a few weeks ago. Has pet dog and chickens but no other exposures. Her dog did scratch her recently but it is healing. Unclear etiology. It will be helpful to see what the organism is identified as.  Salmonellosis could be a possibility.  Recommendations Cont ceftriaxone She would likely benefit from imaging of her chest - start with cxr but consider CT chest to evaluate for aneurysm if she has recurrent chest pain. Repeat LFTs in Am, check crp. Consider MRI T spine or C spine if no obvious etiology and neck or back pain recurs Thank you very  much for allowing me to participate in the care of this patient. Please call with questions.   Cheral Marker. Ola Spurr, MD

## 2017-07-22 MED ORDER — DICLOFENAC SODIUM 1 % TD GEL
2.0000 g | Freq: Four times a day (QID) | TRANSDERMAL | Status: DC | PRN
  Filled 2017-07-22: qty 100

## 2017-07-22 MED ORDER — LEVOTHYROXINE SODIUM 112 MCG PO TABS: 112.0000 ug | ORAL_TABLET | Freq: Every day | ORAL | Status: DC

## 2017-07-22 MED ORDER — AMOXICILLIN-POT CLAVULANATE 875-125 MG PO TABS: 1.0000 | ORAL_TABLET | Freq: Two times a day (BID) | ORAL | 0 refills | Status: AC

## 2017-07-22 NOTE — Discharge Summary (Signed)
[facility] NAME: Taylor Hunt    MR#:  [id number]  DATE OF BIRTH:  Oct 01, 1950  DATE OF ADMISSION:  07/20/2017 ADMITTING PHYSICIAN: Loletha Grayer, MD  DATE OF DISCHARGE: 07/22/2017  1:54 PM  PRIMARY CARE PHYSICIAN: Rusty Aus, MD    ADMISSION DIAGNOSIS:  Positive blood culture [R78.81] Nonintractable headache, unspecified chronicity pattern, unspecified headache type [R51]  DISCHARGE DIAGNOSIS:  Active Problems:   Bacteremia   SECONDARY DIAGNOSIS:   Past Medical History:  Diagnosis Date  . Anemia   . Anxiety   . Arthritis   . Cancer ([place])    skin  . CHF (congestive heart failure) ([place])   . GERD (gastroesophageal reflux disease)   . Headache   . Heart murmur   . Hyperlipemia   . Hypothyroidism   . Pneumonia   . PONV (postoperative nausea and vomiting)     HOSPITAL COURSE:   67 year old female with past medical history of essential hypertension, hyperlipidemia, hypothyroidism, GERD, history of CHF, anxiety, osteoarthritis who presented to the hospital due to positive blood cultures.  1. Positive blood cultures/bacteremia-patient was noted to have gram-negative rods in her blood cultures. -Patient was admitted to the hospital and underwent a urinalysis which was negative, CT of the abdomen and pelvis which showed no acute pathology. Unfortunately patient's blood cultures were false positive and this was notified to the pharmacy staff who then notified me. -Initially patient was empirically on IV ceftriaxone, then switched over to IV Unasyn now being discharged empirically on oral Augmentin. Discussed with Dr. Ola Spurr will see the patient next week. Patient is clinically asymptomatic and feels better. She does complain of a mild headache but significantly improved since admission.  2. History of migraines- She will continue Fioricet as needed.  3. Osteoarthritis- she will continue Celebrex.  4. Hypothyroidism-  she will continue Synthroid.    DISCHARGE CONDITIONS:   Stable  CONSULTS OBTAINED:  Treatment Team:  Leonel Ramsay, MD  DRUG ALLERGIES:   Allergies  Allergen Reactions  . Fentanyl Nausea And Vomiting  . Codeine Other (See Comments)    Dizziness, nausea, vomiting  . Hydrocodone Nausea And Vomiting  . Lovastatin Other (See Comments)    Muscle pain  . Morphine Hives  . Tramadol Nausea And Vomiting    DISCHARGE MEDICATIONS:   Allergies as of 07/22/2017      Reactions   Fentanyl Nausea And Vomiting   Codeine Other (See Comments)   Dizziness, nausea, vomiting   Hydrocodone Nausea And Vomiting   Lovastatin Other (See Comments)   Muscle pain   Morphine Hives   Tramadol Nausea And Vomiting      Medication List    STOP taking these medications   doxycycline 100 MG capsule Commonly known as:  VIBRAMYCIN     TAKE these medications   acetaminophen 325 MG tablet Commonly known as:  TYLENOL Take 650 mg by mouth every 6 (six) hours as needed for moderate pain or headache.   amoxicillin-clavulanate 875-125 MG tablet Commonly known as:  AUGMENTIN Take 1 tablet by mouth 2 (two) times daily for 5 days.   B-complex with vitamin C tablet Take 1 tablet by mouth daily.   butalbital-acetaminophen-caffeine 50-325-40 MG tablet Commonly known as:  FIORICET, ESGIC Take 1-2 tablets by mouth every 6 (six) hours as needed for headache.   CELEBREX 200 MG capsule Generic drug:  celecoxib TAKE 1 CAPSULE (200 MG TOTAL) BY MOUTH 2 (TWO) TIMES A DAY.  cyclobenzaprine 10 MG tablet Commonly known as:  FLEXERIL Take 10 mg by mouth at bedtime.   diclofenac sodium 1 % Gel Commonly known as:  VOLTAREN Apply 2 g topically 4 (four) times daily.   fluticasone 50 MCG/ACT nasal spray Commonly known as:  FLONASE Place 1 spray into both nostrils daily as needed for allergies or rhinitis.   levothyroxine 112 MCG tablet Commonly known as:  SYNTHROID, LEVOTHROID Take 112 mcg by mouth  daily before breakfast.   magnesium oxide 400 MG tablet Commonly known as:  MAG-OX Take 400 mg by mouth daily.   montelukast 10 MG tablet Commonly known as:  SINGULAIR TAKE ONE TABLET BY MOUTH EVERY NIGHT AT BEDTIME   multivitamin with minerals Tabs tablet Take 1 tablet by mouth daily.   vitamin B-12 1000 MCG tablet Commonly known as:  CYANOCOBALAMIN Take 1,000 mcg by mouth daily.   zoledronic acid 5 MG/100ML Soln injection Commonly known as:  RECLAST Inject 5 mg into the vein.         DISCHARGE INSTRUCTIONS:   DIET:  Regular diet  DISCHARGE CONDITION:  Stable  ACTIVITY:  Activity as tolerated  OXYGEN:  Home Oxygen: No.   Oxygen Delivery: room air  DISCHARGE LOCATION:  home   If you experience worsening of your admission symptoms, develop shortness of breath, life threatening emergency, suicidal or homicidal thoughts you must seek medical attention immediately by calling 911 or calling your MD immediately  if symptoms less severe.  You Must read complete instructions/literature along with all the possible adverse reactions/side effects for all the Medicines you take and that have been prescribed to you. Take any new Medicines after you have completely understood and accpet all the possible adverse reactions/side effects.   Please note  You were cared for by a hospitalist during your hospital stay. If you have any questions about your discharge medications or the care you received while you were in the hospital after you are discharged, you can call the unit and asked to speak with the hospitalist on call if the hospitalist that took care of you is not available. Once you are discharged, your primary care physician will handle any further medical issues. Please note that NO REFILLS for any discharge medications will be authorized once you are discharged, as it is imperative that you return to your primary care physician (or establish a relationship with a primary  care physician if you do not have one) for your aftercare needs so that they can reassess your need for medications and monitor your lab values.     Today   Patient complains of a mild headache but no other symptoms. She's been afebrile overnight. Cultures remained negative. Unfortunately her blood cultures were false positive so she was therefore never really bacteremic.  VITAL SIGNS:  Blood pressure 127/72, pulse 84, temperature 98.9 F (37.2 C), temperature source Oral, resp. rate 18, height 5\' 4"  (1.626 m), weight 66.7 kg (147 lb), SpO2 99 %.  I/O:    Intake/Output Summary (Last 24 hours) at 07/22/2017 1521 Last data filed at 07/22/2017 1348 Gross per 24 hour  Intake 1859 ml  Output 1900 ml  Net -41 ml    PHYSICAL EXAMINATION:  GENERAL:  67 y.o.-year-old patient lying in the bed with no acute distress.  EYES: Pupils equal, round, reactive to light and accommodation. No scleral icterus. Extraocular muscles intact.  HEENT: Head atraumatic, normocephalic. Oropharynx and nasopharynx clear.  NECK:  Supple, no jugular venous distention. No thyroid enlargement,  no tenderness.  LUNGS: Normal breath sounds bilaterally, no wheezing, rales,rhonchi. No use of accessory muscles of respiration.  CARDIOVASCULAR: S1, S2 normal. No murmurs, rubs, or gallops.  ABDOMEN: Soft, non-tender, non-distended. Bowel sounds present. No organomegaly or mass.  EXTREMITIES: No pedal edema, cyanosis, or clubbing.  NEUROLOGIC: Cranial nerves II through XII are intact. No focal motor or sensory defecits b/l.  PSYCHIATRIC: The patient is alert and oriented x 3.  SKIN: No obvious rash, lesion, or ulcer.   DATA REVIEW:   CBC Recent Labs  Lab 07/21/17 0450  WBC 4.5  HGB 12.0  HCT 35.6  PLT 264    Chemistries  Recent Labs  Lab 07/20/17 1800 07/21/17 0450  NA 138 140  K 3.6 3.7  CL 103 109  CO2 23 23  GLUCOSE 140* 103*  BUN 13 11  CREATININE 0.66 0.48  CALCIUM 9.6 8.4*  AST 147*  --   ALT  145*  --   ALKPHOS 99  --   BILITOT 0.6  --     Cardiac Enzymes No results for input(s): TROPONINI in the last 168 hours.  Microbiology Results  Results for orders placed or performed during the hospital encounter of 07/20/17  Culture, blood (Routine x 2)     Status: None (Preliminary result)   Collection Time: 07/20/17  6:00 PM  Result Value Ref Range Status   Specimen Description BLOOD BLOOD RIGHT HAND  Final   Special Requests   Final    BOTTLES DRAWN AEROBIC AND ANAEROBIC Blood Culture results may not be optimal due to an inadequate volume of blood received in culture bottles   Culture   Final    NO GROWTH 2 DAYS Performed at [facility], [address], [place], [place] [address]    Report Status PENDING  Incomplete  Culture, blood (Routine x 2)     Status: None (Preliminary result)   Collection Time: 07/20/17  6:05 PM  Result Value Ref Range Status   Specimen Description BLOOD RIGHT ANTECUBITAL  Final   Special Requests   Final    BOTTLES DRAWN AEROBIC AND ANAEROBIC Blood Culture results may not be optimal due to an excessive volume of blood received in culture bottles   Culture   Final    NO GROWTH 2 DAYS Performed at [facility], [address] [place], [place] [address]    Report Status PENDING  Incomplete    RADIOLOGY:  Dg Chest 2 View  Result Date: 07/21/2017 CLINICAL DATA:  67 year old female with fever. EXAM: CHEST - 2 VIEW COMPARISON:  None. FINDINGS: There is no focal consolidation, pleural effusion, or pneumothorax. The cardiac silhouette is within normal limits. Atherosclerotic calcification of the aortic arch. Lower cervical fixation plate and screws partially visualized. Partially visualized rounded calcific densities over the left shoulder likely represent intra-articular loose bodies. Clinical correlation is recommended. Dedicated radiograph of the left shoulder may provide better evaluation. IMPRESSION: No active cardiopulmonary  disease. Electronically Signed   By: Anner Crete M.D.   On: 07/21/2017 18:56   Ct Abdomen Pelvis W Contrast  Result Date: 07/21/2017 CLINICAL DATA:  Acute abdominal pain generalized with fever. Appendectomy cholecystectomy oophorectomy EXAM: CT ABDOMEN AND PELVIS WITH CONTRAST TECHNIQUE: Multidetector CT imaging of the abdomen and pelvis was performed using the standard protocol following bolus administration of intravenous contrast. CONTRAST:  100 mL Isovue 300 IV COMPARISON:  CT abdomen 06/05/2010 FINDINGS: Lower chest: Lung bases clear. Hepatobiliary: Multiple hepatic cysts appear benign with progression from  the prior study. Negative for mass lesion. Cholecystectomy. No biliary dilatation. Pancreas: Negative Spleen: Negative Adrenals/Urinary Tract: Negative for renal mass. No renal stone or obstruction. Urinary bladder normal. Stomach/Bowel: Negative for bowel obstruction. Negative for bowel mass or edema. Appendectomy. Vascular/Lymphatic: Mild atherosclerotic disease without aortic aneurysm Reproductive: Normal uterus.  Prior oophorectomy.  No pelvic mass. Other: Negative for free fluid.  Negative for hernia. Musculoskeletal: Grade 1 anterolisthesis L5-S1. Retrolisthesis L2-3. No acute skeletal abnormality. IMPRESSION: No cause for acute abdominal pain identified Numerous hepatic cysts, with progression since 2012 Postop appendectomy and oophorectomy. Electronically Signed   By: Franchot Gallo M.D.   On: 07/21/2017 10:04      Management plans discussed with the patient, family and they are in agreement.  CODE STATUS:     Code Status Orders  (From admission, onward)        Start     Ordered   07/20/17 1952  Full code  Continuous     07/20/17 1952    Code Status History    Date Active Date Inactive Code Status Order ID Comments User Context   This patient has a current code status but no historical code status.    Advance Directive Documentation     Most Recent Value  Type of  Advance Directive  Healthcare Power of Attorney  Pre-existing out of facility DNR order (yellow form or pink MOST form)  No data  "MOST" Form in Place?  No data      TOTAL TIME TAKING CARE OF THIS PATIENT: 40 minutes.    Henreitta Leber M.D on 07/22/2017 at 3:21 PM  Between 7am to 6pm - Pager - [phone]  After 6pm go to [email or web address] - Proofreader  [facility]  Office  [phone]  CC: Primary care physician; Rusty Aus, MD

## 2017-07-22 NOTE — Progress Notes (Signed)
Patient discharge teaching given, including activity, diet, follow-up appoints, and medications. Patient verbalized understanding of all discharge instructions. IV access was d/c'd. Vitals are stable. Skin is intact except as charted in most recent assessments. Pt to be escorted out by NT, to be driven home by family.  Taylor Hunt  

## 2017-07-22 NOTE — Care Management Important Message (Signed)
Important Message  Patient Details  Name: Taylor Hunt MRN: [id number] Date of Birth: 04/12/1951   Medicare Important Message Given:  N/A - LOS <3 / Initial given by admissions    Beverly Sessions, RN 07/22/2017, 11:45 AM

## 2017-07-24 LAB — CULTURE, BLOOD (ROUTINE X 2)
CULTURE: NO GROWTH
Culture: NO GROWTH
SPECIAL REQUESTS: ADEQUATE
Special Requests: ADEQUATE

## 2017-07-25 LAB — CULTURE, BLOOD (ROUTINE X 2)
CULTURE: NO GROWTH
Culture: NO GROWTH

## 2017-09-07 DIAGNOSIS — Z8249 Family history of ischemic heart disease and other diseases of the circulatory system: Secondary | ICD-10-CM | POA: Insufficient documentation

## 2017-12-20 ENCOUNTER — Other Ambulatory Visit: Payer: Self-pay | Admitting: Internal Medicine

## 2017-12-20 ENCOUNTER — Other Ambulatory Visit: Payer: Self-pay | Admitting: Obstetrics and Gynecology

## 2017-12-20 DIAGNOSIS — Z1231 Encounter for screening mammogram for malignant neoplasm of breast: Secondary | ICD-10-CM

## 2018-01-12 ENCOUNTER — Ambulatory Visit
Admission: RE | Admit: 2018-01-12 | Discharge: 2018-01-12 | Disposition: A | Discharge status: Home / Self Care | Payer: Medicare Other | Source: Ambulatory Visit | Attending: Internal Medicine | Admitting: Internal Medicine

## 2018-01-12 DIAGNOSIS — Z1231 Encounter for screening mammogram for malignant neoplasm of breast: Secondary | ICD-10-CM | POA: Insufficient documentation

## 2018-02-22 DIAGNOSIS — I471 Supraventricular tachycardia: Secondary | ICD-10-CM | POA: Insufficient documentation

## 2018-04-26 DIAGNOSIS — Z0000 Encounter for general adult medical examination without abnormal findings: Secondary | ICD-10-CM | POA: Insufficient documentation

## 2018-04-26 DIAGNOSIS — Z800 Family history of malignant neoplasm of digestive organs: Secondary | ICD-10-CM | POA: Insufficient documentation

## 2018-09-15 ENCOUNTER — Other Ambulatory Visit: Payer: Self-pay | Admitting: Gastroenterology

## 2018-09-15 DIAGNOSIS — R1013 Epigastric pain: Secondary | ICD-10-CM

## 2018-09-26 ENCOUNTER — Other Ambulatory Visit: Payer: Self-pay

## 2018-09-26 ENCOUNTER — Ambulatory Visit
Admission: RE | Admit: 2018-09-26 | Discharge: 2018-09-26 | Disposition: A | Discharge status: Home / Self Care | Payer: Medicare Other | Source: Ambulatory Visit | Attending: Gastroenterology | Admitting: Gastroenterology

## 2018-09-26 DIAGNOSIS — R1013 Epigastric pain: Secondary | ICD-10-CM | POA: Diagnosis present

## 2019-02-01 ENCOUNTER — Other Ambulatory Visit: Payer: Self-pay | Admitting: Internal Medicine

## 2019-02-01 DIAGNOSIS — Z1231 Encounter for screening mammogram for malignant neoplasm of breast: Secondary | ICD-10-CM

## 2019-02-28 ENCOUNTER — Encounter: Payer: Self-pay | Admitting: Podiatry

## 2019-02-28 ENCOUNTER — Ambulatory Visit (INDEPENDENT_AMBULATORY_CARE_PROVIDER_SITE_OTHER): Payer: Medicare Other | Admitting: Podiatry

## 2019-02-28 ENCOUNTER — Other Ambulatory Visit: Payer: Self-pay

## 2019-02-28 DIAGNOSIS — L989 Disorder of the skin and subcutaneous tissue, unspecified: Secondary | ICD-10-CM | POA: Diagnosis not present

## 2019-03-03 NOTE — Progress Notes (Signed)
   Subjective: 68 y.o. female presenting to the office today with a chief complaint of intermittent burning and tingling pain secondary to lesions noted to the plantar feet bilaterally that have been present for the past two months. She states she was seen by a dermatologist and diagnosed with plantar warts. She has been using OTC wart removers and has filed the areas down. Patient is here for further evaluation and treatment.   Past Medical History:  Diagnosis Date  . Anemia   . Anxiety   . Arthritis   . Cancer ([place])    skin  . CHF (congestive heart failure) ([place])   . GERD (gastroesophageal reflux disease)   . Headache   . Heart murmur   . Hyperlipemia   . Hypothyroidism   . Pneumonia   . PONV (postoperative nausea and vomiting)      Objective:  Physical Exam General: Alert and oriented x3 in no acute distress  Dermatology: Hyperkeratotic lesion(s) present on the bilateral feet. Pain on palpation with a central nucleated core noted. Skin is warm, dry and supple bilateral lower extremities. Negative for open lesions or macerations.  Vascular: Palpable pedal pulses bilaterally. No edema or erythema noted. Capillary refill within normal limits.  Neurological: Epicritic and protective threshold grossly intact bilaterally.   Musculoskeletal Exam: Pain on palpation at the keratotic lesion(s) noted. Range of motion within normal limits bilateral. Muscle strength 5/5 in all groups bilateral.  Assessment: 1. Porokeratosis bilateral feet x 4   Plan of Care:  1. Patient evaluated 2. Excisional debridement of keratoic lesion(s) using a chisel blade was performed without incident.  3. Salicylic acid applied to left foot, Cantharone applied to right foot callus. Dressed areas with light dressing. 4. Patient is to return in 2 weeks.   Edrick Kins, DPM [facility]  Dr. Edrick Kins, [address] [place]                                        [place],  [place] [address]                Office [phone]  Fax [phone]

## 2019-03-17 ENCOUNTER — Other Ambulatory Visit: Payer: Self-pay

## 2019-03-17 ENCOUNTER — Ambulatory Visit (INDEPENDENT_AMBULATORY_CARE_PROVIDER_SITE_OTHER): Payer: Medicare Other | Admitting: Podiatry

## 2019-03-17 DIAGNOSIS — L989 Disorder of the skin and subcutaneous tissue, unspecified: Secondary | ICD-10-CM | POA: Diagnosis not present

## 2019-03-17 DIAGNOSIS — G5762 Lesion of plantar nerve, left lower limb: Secondary | ICD-10-CM | POA: Diagnosis not present

## 2019-03-21 NOTE — Progress Notes (Signed)
   Subjective: 68 y.o. female presenting to the office today with a chief complaint of intermittent burning and tingling pain secondary to lesions noted to the plantar feet bilaterally that have been present for the past two months. She states she was seen by a dermatologist and diagnosed with plantar warts. She has been using OTC wart removers and has filed the areas down. Patient is here for further evaluation and treatment.   Past Medical History:  Diagnosis Date  . Anemia   . Anxiety   . Arthritis   . Cancer ([place])    skin  . CHF (congestive heart failure) ([place])   . GERD (gastroesophageal reflux disease)   . Headache   . Heart murmur   . Hyperlipemia   . Hypothyroidism   . Pneumonia   . PONV (postoperative nausea and vomiting)      Objective:  Physical Exam General: Alert and oriented x3 in no acute distress  Dermatology: Hyperkeratotic lesion(s) present on the bilateral feet. Pain on palpation with a central nucleated core noted. Skin is warm, dry and supple bilateral lower extremities. Negative for open lesions or macerations.  Vascular: Palpable pedal pulses bilaterally. No edema or erythema noted. Capillary refill within normal limits.  Neurological: Epicritic and protective threshold grossly intact bilaterally.   Musculoskeletal Exam: Pain on palpation at the keratotic lesion(s) noted. Sharp pain with palpation of the 2nd interspace and lateral compression of the metatarsal heads consistent with neuroma.  Positive Conley Canal sign with loadbearing of the forefoot. Range of motion within normal limits bilateral. Muscle strength 5/5 in all groups bilateral.  Assessment: 1. Porokeratosis bilateral feet x 4 - improved  2. 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 Patient evaluated 2. Excisional debridement of keratoic lesion(s) using a chisel blade was performed without incident. Light dressing placed.  3. Injection of 0.5 mLs Celestone Soluspan injected  into the neuroma of the left foot.  4. Met pads dispensed.  5. Continue taking Celebrex as directed by PCP.  6. Return to clinic as needed.   Edrick Kins, DPM [facility]  Dr. Edrick Kins, [place]                                        [place], [place] [address]                Office [phone]  Fax [phone]

## 2019-03-22 ENCOUNTER — Ambulatory Visit
Admission: RE | Admit: 2019-03-22 | Discharge: 2019-03-22 | Disposition: A | Discharge status: Home / Self Care | Payer: Medicare Other | Source: Ambulatory Visit | Attending: Internal Medicine | Admitting: Internal Medicine

## 2019-03-22 DIAGNOSIS — Z1231 Encounter for screening mammogram for malignant neoplasm of breast: Secondary | ICD-10-CM | POA: Diagnosis not present

## 2019-06-23 ENCOUNTER — Ambulatory Visit: Payer: Medicare Other

## 2019-09-20 ENCOUNTER — Telehealth: Payer: Self-pay

## 2019-09-20 NOTE — Telephone Encounter (Signed)
NOTES [place] [phone], SENT REFERRAL TO [facility]

## 2019-10-02 ENCOUNTER — Ambulatory Visit: Payer: Medicare Other | Admitting: Interventional Cardiology

## 2019-10-05 ENCOUNTER — Ambulatory Visit (INDEPENDENT_AMBULATORY_CARE_PROVIDER_SITE_OTHER): Payer: Medicare Other | Admitting: Dermatology

## 2019-10-05 ENCOUNTER — Other Ambulatory Visit: Payer: Self-pay

## 2019-10-05 DIAGNOSIS — H019 Unspecified inflammation of eyelid: Secondary | ICD-10-CM

## 2019-10-05 DIAGNOSIS — Z86018 Personal history of other benign neoplasm: Secondary | ICD-10-CM

## 2019-10-05 DIAGNOSIS — L570 Actinic keratosis: Secondary | ICD-10-CM | POA: Diagnosis not present

## 2019-10-05 DIAGNOSIS — Z85828 Personal history of other malignant neoplasm of skin: Secondary | ICD-10-CM | POA: Diagnosis not present

## 2019-10-05 DIAGNOSIS — L820 Inflamed seborrheic keratosis: Secondary | ICD-10-CM | POA: Diagnosis not present

## 2019-10-05 MED ORDER — MOMETASONE FUROATE 0.1 % EX OINT: TOPICAL_OINTMENT | CUTANEOUS | 0 refills | Status: DC

## 2019-10-05 NOTE — Patient Instructions (Addendum)
Recommend daily broad spectrum sunscreen SPF 30+ to sun-exposed areas, reapply every 2 hours as needed. Call for new or changing lesions.  Cryotherapy Aftercare  . Wash gently with soap and water everyday.   . Apply Vaseline and Band-Aid daily until healed.  Topical steroids (such as triamcinolone, fluocinolone, fluocinonide, mometasone, clobetasol, halobetasol, betamethasone, hydrocortisone) can cause thinning and lightening of the skin if they are used for too long in the same area. Your physician has selected the right strength medicine for your problem and area affected on the body. Please use your medication only as directed by your physician to prevent side effects.   

## 2019-10-05 NOTE — Progress Notes (Signed)
Follow-Up Visit   Subjective  Taylor Hunt is a 69 y.o. female who presents for the following: Areas of Concern (Patient has several spots of concern, Right forehead for a few day, has been sore, Rough spot for past month near corner of right eye, scaly spot on the top of nose for several weeks, round scaly spot near left ear for past few months, bump on the inside of right ear, noticed today, red scaly area on lef hand dorsum for few months, red area on lower left leg near SCC scar. ). Patient has a history of SCC and Dysplastic nevi. SCC L.post lower leg 01/24/14, L forearm, 08/29/08 and R lower leg 03/21/08. Dysplastic Right med ant thigh 11/07/12, and R dorsum 2nd toe 10/15/09.   The following portions of the chart were reviewed this encounter and updated as appropriate:  Tobacco  Allergies  Meds  Problems  Med Hx  Surg Hx  Fam Hx      Review of Systems:  No other skin or systemic complaints except as noted in HPI or Assessment and Plan.  Objective  Well appearing patient in no apparent distress; mood and affect are within normal limits.  A focused examination was performed including forehead, nose, ears, hands and legs. Relevant physical exam findings are noted in the Assessment and Plan.  Objective  Dorsum of Nose, Left dorsum hand (2): Erythematous thin papules/macules with gritty scale.   Objective  Right Lateral Canthus: Scaly erythematous papules    Objective  Right Forehead, L preauricular x2, R ear canal, L Pretibial x 3, R Pretibial (8): Erythematous keratotic or waxy stuck-on papule or plaque.    Assessment & Plan  AK (actinic keratosis) (2) Dorsum of Nose, Left dorsum hand  Cryotherapy today Prior to procedure, discussed risks of blister formation, small wound, skin dyspigmentation, or rare scar following cryotherapy.    Destruction of lesion - Dorsum of Nose, Left dorsum hand Complexity: simple   Destruction method: cryotherapy   Informed consent:  discussed and consent obtained   Timeout:  patient name, date of birth, surgical site, and procedure verified Lesion destroyed using liquid nitrogen: Yes   Region frozen until ice ball extended beyond lesion: Yes   Outcome: patient tolerated procedure well with no complications   Post-procedure details: wound care instructions given    Dermatitis of eyelid of right eye, unspecified type Right Lateral Canthus  Start mometasone ointment apply to affected area as needed.  Topical steroids (such as triamcinolone, fluocinolone, fluocinonide, mometasone, clobetasol, halobetasol, betamethasone, hydrocortisone) can cause thinning and lightening of the skin if they are used for too long in the same area. Your physician has selected the right strength medicine for your problem and area affected on the body. Please use your medication only as directed by your physician to prevent side effects.    mometasone (ELOCON) 0.1 % ointment - Right Lateral Canthus  Inflamed seborrheic keratosis (8) Right Forehead, L preauricular x2, R ear canal, L Pretibial x 3, R Pretibial  Cryotherapy today Prior to procedure, discussed risks of blister formation, small wound, skin dyspigmentation, or rare scar following cryotherapy.    Destruction of lesion - Right Forehead, L preauricular x2, R ear canal, L Pretibial x 3, R Pretibial Complexity: simple   Destruction method: cryotherapy   Informed consent: discussed and consent obtained   Timeout:  patient name, date of birth, surgical site, and procedure verified Lesion destroyed using liquid nitrogen: Yes   Region frozen until ice ball extended  beyond lesion: Yes   Outcome: patient tolerated procedure well with no complications   Post-procedure details: wound care instructions given      Return in about 4 months (around 02/05/2020) for TBSE.  I, Donzetta Kohut, CMA, am acting as scribe for Sarina Ser, MD . Documentation: I have reviewed the above  documentation for accuracy and completeness, and I agree with the above.  Sarina Ser, MD

## 2019-10-13 ENCOUNTER — Encounter: Payer: Self-pay | Admitting: Dermatology

## 2019-11-22 ENCOUNTER — Other Ambulatory Visit
Admission: RE | Admit: 2019-11-22 | Discharge: 2019-11-22 | Disposition: A | Discharge status: Home / Self Care | Payer: Medicare Other | Source: Ambulatory Visit | Attending: General Surgery | Admitting: General Surgery

## 2019-11-22 ENCOUNTER — Other Ambulatory Visit: Payer: Self-pay

## 2019-11-22 DIAGNOSIS — Z20822 Contact with and (suspected) exposure to covid-19: Secondary | ICD-10-CM | POA: Insufficient documentation

## 2019-11-22 DIAGNOSIS — Z01812 Encounter for preprocedural laboratory examination: Secondary | ICD-10-CM | POA: Diagnosis present

## 2019-11-22 LAB — SARS CORONAVIRUS 2 (TAT 6-24 HRS): SARS Coronavirus 2: NEGATIVE

## 2019-11-24 ENCOUNTER — Ambulatory Visit: Admission: RE | Admit: 2019-11-24 | Payer: Medicare Other | Source: Home / Self Care | Admitting: General Surgery

## 2019-11-24 ENCOUNTER — Encounter: Admission: RE | Payer: Self-pay | Source: Home / Self Care

## 2019-11-24 SURGERY — COLONOSCOPY WITH PROPOFOL
Anesthesia: General

## 2019-12-11 ENCOUNTER — Other Ambulatory Visit: Payer: Self-pay

## 2019-12-11 ENCOUNTER — Ambulatory Visit (INDEPENDENT_AMBULATORY_CARE_PROVIDER_SITE_OTHER): Payer: Medicare Other | Admitting: Dermatology

## 2019-12-11 DIAGNOSIS — L821 Other seborrheic keratosis: Secondary | ICD-10-CM | POA: Diagnosis not present

## 2019-12-11 DIAGNOSIS — L578 Other skin changes due to chronic exposure to nonionizing radiation: Secondary | ICD-10-CM

## 2019-12-11 DIAGNOSIS — L820 Inflamed seborrheic keratosis: Secondary | ICD-10-CM

## 2019-12-11 DIAGNOSIS — L720 Epidermal cyst: Secondary | ICD-10-CM

## 2019-12-11 NOTE — Progress Notes (Signed)
   Follow-Up Visit   Subjective  Taylor Hunt is a 69 y.o. female who presents for the following: Lesion (R side of face - irregular appearing, has been there for about 4-5 weeks).  The following portions of the chart were reviewed this encounter and updated as appropriate:  Tobacco  Allergies  Meds  Problems  Med Hx  Surg Hx  Fam Hx     Review of Systems:  No other skin or systemic complaints except as noted in HPI or Assessment and Plan.  Objective  Well appearing patient in no apparent distress; mood and affect are within normal limits.  A focused examination was performed including the face . Relevant physical exam findings are noted in the Assessment and Plan.  Objective  Face (16): Erythematous keratotic or waxy stuck-on papule or plaque.   Objective  Face: Smooth white papule(s).   Assessment & Plan  Inflamed seborrheic keratosis (16) Face  Destruction of lesion - Face Complexity: simple   Destruction method: cryotherapy   Informed consent: discussed and consent obtained   Timeout:  patient name, date of birth, surgical site, and procedure verified Lesion destroyed using liquid nitrogen: Yes   Region frozen until ice ball extended beyond lesion: Yes   Outcome: patient tolerated procedure well with no complications   Post-procedure details: wound care instructions given    [place] Face  Benign, observe.     Actinic Damage - diffuse scaly erythematous macules with underlying dyspigmentation - Recommend daily broad spectrum sunscreen SPF 30+ to sun-exposed areas, reapply every 2 hours as needed.  - Call for new or changing lesions.  Seborrheic Keratoses - Stuck-on, waxy, tan-brown papules and plaques  - Discussed benign etiology and prognosis. - Observe - Call for any changes  Return for appointment as scheduled.  Luther Redo, CMA, am acting as scribe for Sarina Ser, MD .  Documentation: I have reviewed the above documentation for accuracy and  completeness, and I agree with the above.  Sarina Ser, MD

## 2019-12-12 ENCOUNTER — Encounter: Payer: Self-pay | Admitting: Dermatology

## 2019-12-21 ENCOUNTER — Ambulatory Visit: Payer: Medicare Other | Admitting: Dermatology

## 2019-12-25 ENCOUNTER — Other Ambulatory Visit
Admission: RE | Admit: 2019-12-25 | Discharge: 2019-12-25 | Disposition: A | Discharge status: Home / Self Care | Payer: Medicare Other | Source: Ambulatory Visit | Attending: General Surgery | Admitting: General Surgery

## 2019-12-25 ENCOUNTER — Other Ambulatory Visit: Payer: Self-pay

## 2019-12-25 DIAGNOSIS — Z01812 Encounter for preprocedural laboratory examination: Secondary | ICD-10-CM | POA: Insufficient documentation

## 2019-12-25 DIAGNOSIS — Z20822 Contact with and (suspected) exposure to covid-19: Secondary | ICD-10-CM | POA: Insufficient documentation

## 2019-12-25 LAB — SARS CORONAVIRUS 2 (TAT 6-24 HRS): SARS Coronavirus 2: NEGATIVE

## 2019-12-26 ENCOUNTER — Encounter: Payer: Self-pay | Admitting: General Surgery

## 2019-12-27 ENCOUNTER — Encounter
Admission: RE | Disposition: A | Discharge status: Home / Self Care | Payer: Self-pay | Source: Home / Self Care | Attending: General Surgery

## 2019-12-27 ENCOUNTER — Encounter: Payer: Self-pay | Admitting: General Surgery

## 2019-12-27 ENCOUNTER — Ambulatory Visit: Payer: Medicare Other | Admitting: Certified Registered Nurse Anesthetist

## 2019-12-27 ENCOUNTER — Ambulatory Visit
Admission: RE | Admit: 2019-12-27 | Discharge: 2019-12-27 | Disposition: A | Discharge status: Home / Self Care | Payer: Medicare Other | Attending: General Surgery | Admitting: General Surgery

## 2019-12-27 ENCOUNTER — Other Ambulatory Visit: Payer: Self-pay | Admitting: General Surgery

## 2019-12-27 DIAGNOSIS — Z86010 Personal history of colonic polyps: Secondary | ICD-10-CM | POA: Insufficient documentation

## 2019-12-27 DIAGNOSIS — I509 Heart failure, unspecified: Secondary | ICD-10-CM | POA: Diagnosis not present

## 2019-12-27 DIAGNOSIS — Z85828 Personal history of other malignant neoplasm of skin: Secondary | ICD-10-CM | POA: Insufficient documentation

## 2019-12-27 DIAGNOSIS — Z1211 Encounter for screening for malignant neoplasm of colon: Secondary | ICD-10-CM | POA: Insufficient documentation

## 2019-12-27 HISTORY — PX: COLONOSCOPY WITH PROPOFOL: SHX5780

## 2019-12-27 SURGERY — COLONOSCOPY WITH PROPOFOL
Anesthesia: General

## 2019-12-27 MED ORDER — PROPOFOL 10 MG/ML IV BOLUS
INTRAVENOUS | Status: AC
  Filled 2019-12-27: qty 80

## 2019-12-27 MED ORDER — PROPOFOL 10 MG/ML IV BOLUS
INTRAVENOUS | Status: DC | PRN
  Administered 2019-12-27: 70 mg via INTRAVENOUS
  Administered 2019-12-27 (×2): 50 mg via INTRAVENOUS

## 2019-12-27 MED ORDER — SODIUM CHLORIDE 0.9 % IV SOLN: INTRAVENOUS | Status: DC

## 2019-12-27 MED ORDER — LIDOCAINE HCL (CARDIAC) PF 100 MG/5ML IV SOSY
PREFILLED_SYRINGE | INTRAVENOUS | Status: DC | PRN
  Administered 2019-12-27: 80 mg via INTRAVENOUS

## 2019-12-27 MED ORDER — PROPOFOL 500 MG/50ML IV EMUL
INTRAVENOUS | Status: DC | PRN
  Administered 2019-12-27: 100 ug/kg/min via INTRAVENOUS

## 2019-12-27 NOTE — Transfer of Care (Signed)
Immediate Anesthesia Transfer of Care Note  Patient: Taylor Hunt  Procedure(s) Performed: COLONOSCOPY WITH PROPOFOL (N/A )  Patient Location: PACU and Endoscopy Unit  Anesthesia Type:General  Level of Consciousness: drowsy  Airway & Oxygen Therapy: Patient Spontanous Breathing  Post-op Assessment: Report given to RN and Post -op Vital signs reviewed and stable  Post vital signs: Reviewed and stable  Last Vitals:  Vitals Value Taken Time  BP 109/56 12/27/19 0934  Temp    Pulse 81 12/27/19 0935  Resp 15 12/27/19 0935  SpO2 100 % 12/27/19 0935  Vitals shown include unvalidated device data.  Last Pain:  Vitals:   12/27/19 0833  TempSrc: Temporal  PainSc: 0-No pain         Complications: No complications documented.

## 2019-12-27 NOTE — H&P (Addendum)
Taylor Hunt [id number] 04-Jun-1950     HPI:  Patient with past history of colonic polyps. For follow up exam. The patient reports she had several episodes of emesis after completing the prep.  None this morning.    No medications prior to admission.   Allergies  Allergen Reactions  . Fentanyl Nausea And Vomiting  . Codeine Other (See Comments)    Dizziness, nausea, vomiting  . Gabapentin Other (See Comments)  . Hydrocodone Nausea And Vomiting  . Lovastatin Other (See Comments)    Muscle pain  . Morphine Hives  . Tramadol Nausea And Vomiting   Past Medical History:  Diagnosis Date  . Anemia   . Anxiety   . Arthritis   . Cancer ([place])    skin  . CHF (congestive heart failure) ([place])   . GERD (gastroesophageal reflux disease)   . Headache   . Heart murmur   . Hx of basal cell carcinoma 01/22/2015   L superior pectoral, excised  . Hx of dysplastic nevus 10/15/2009   R dorsum 2nd toe, mild atypia  . Hx of dysplastic nevus 11/07/2012   R mid anterior thigh, modterate atypia  . Hyperlipemia   . Hypothyroidism   . Pneumonia   . PONV (postoperative nausea and vomiting)   . Squamous cell carcinoma of skin 09/15/2006   L forearm, excised  . Squamous cell carcinoma of skin 03/21/2008   R lower leg, SCCIS excised  . Squamous cell carcinoma of skin 07/19/2008   R forearm  . Squamous cell carcinoma of skin 10/25/2012   L mid dorsum forearm  . Squamous cell carcinoma of skin 01/24/2014   L posterior lower leg   Past Surgical History:  Procedure Laterality Date  . ANTERIOR CERVICAL DECOMP/DISCECTOMY FUSION N/A 09/18/2015   Procedure: ANTERIOR CERVICAL DECOMPRESSION/DISCECTOMY INTERBODY FUSION PLATING BONEGRAFT CERVICAL FIVE-SIX ,CERVICAL SIX-SEVEN ;  Surgeon: Newman Pies, MD;  Location: [facility];  Service: Neurosurgery;  Laterality: N/A;  . APPENDECTOMY    . both shoulder surgery    . BREAST BIOPSY Right    negative 2000 x 2   . CHOLECYSTECTOMY    . COLONOSCOPY    .  EYE SURGERY    . NEUROPLASTY / TRANSPOSITION ULNAR NERVE AT ELBOW  02/1996  . OOPHORECTOMY    . right knee surgery    . skin cancer removed    . TONSILLECTOMY     Social History   Socioeconomic History  . Marital status: Single    Spouse name: Not on file  . Number of children: Not on file  . Years of education: Not on file  . Highest education level: Not on file  Occupational History  . Not on file  Tobacco Use  . Smoking status: Never Smoker  . Smokeless tobacco: Never Used  Substance and Sexual Activity  . Alcohol use: Yes    Comment: occ social  . Drug use: No  . Sexual activity: Not on file  Other Topics Concern  . Not on file  Social History Narrative  . Not on file   Social Determinants of Health   Financial Resource Strain:   . Difficulty of Paying Living Expenses:   Food Insecurity:   . Worried About Charity fundraiser in the Last Year:   . Arboriculturist in the Last Year:   Transportation Needs:   . Film/video editor (Medical):   Marland Kitchen Lack of Transportation (Non-Medical):   Physical Activity:   . Days  of Exercise per Week:   . Minutes of Exercise per Session:   Stress:   . Feeling of Stress :   Social Connections:   . Frequency of Communication with Friends and Family:   . Frequency of Social Gatherings with Friends and Family:   . Attends Religious Services:   . Active Member of Clubs or Organizations:   . Attends Archivist Meetings:   Marland Kitchen Marital Status:   Intimate Partner Violence:   . Fear of Current or Ex-Partner:   . Emotionally Abused:   Marland Kitchen Physically Abused:   . Sexually Abused:    Social History   Social History Narrative  . Not on file     ROS: Negative.  2012 colonoscopy: Part C: TRANSVERSE COLON POLYP COLD BIOPSY:  - TUBULAR ADENOMA.  - NEGATIVE FOR HIGH GRADE DYSPLASIA AND MALIGNANCY.  .  Part D: CECAL POLYP COLD SNARED:  - TUBULAR ADENOMA.  - NEGATIVE FOR HIGH GRADE DYSPLASIA AND MALIGNANCY.  .  Part E:  SIGMOID COLON POLYP HOT SNARED:  - TUBULAR ADENOMA.  - NEGATIVE FOR HIGH GRADE DYSPLASIA AND MALIGNANCY.  2015 colonoscopy: One polyp by report.   PE: HEENT: Negative. Lungs: Clear. Cardio: RR.  Assessment/Plan:  Proceed with planned endoscopy.   [name] [facility] 12/27/2019

## 2019-12-27 NOTE — Anesthesia Preprocedure Evaluation (Signed)
Anesthesia Evaluation  Patient identified by MRN, date of birth, ID band Patient awake    Reviewed: Allergy & Precautions, H&P , NPO status , Patient's Chart, lab work & pertinent test results  History of Anesthesia Complications (+) PONV and history of anesthetic complications  Airway Mallampati: III  TM Distance: <3 FB Neck ROM: limited    Dental  (+) Chipped   Pulmonary neg shortness of breath, pneumonia,    Pulmonary exam normal        Cardiovascular Exercise Tolerance: Good (-) angina+CHF  (-) Past MI Normal cardiovascular exam+ dysrhythmias Atrial Fibrillation + Valvular Problems/Murmurs      Neuro/Psych  Headaches, PSYCHIATRIC DISORDERS  Neuromuscular disease    GI/Hepatic negative GI ROS, Neg liver ROS, GERD  ,  Endo/Other  Hypothyroidism   Renal/GU negative Renal ROS  negative genitourinary   Musculoskeletal  (+) Arthritis ,   Abdominal   Peds  Hematology negative hematology ROS (+)   Anesthesia Other Findings Past Medical History: No date: Anemia No date: Anxiety No date: Arthritis No date: Cancer [facility])     Comment:  skin No date: CHF (congestive heart failure) (HCC) No date: GERD (gastroesophageal reflux disease) No date: Headache No date: Heart murmur 01/22/2015: Hx of basal cell carcinoma     Comment:  L superior pectoral, excised 10/15/2009: Hx of dysplastic nevus     Comment:  R dorsum 2nd toe, mild atypia 11/07/2012: Hx of dysplastic nevus     Comment:  R mid anterior thigh, modterate atypia No date: Hyperlipemia No date: Hypothyroidism No date: Pneumonia No date: PONV (postoperative nausea and vomiting) 09/15/2006: Squamous cell carcinoma of skin     Comment:  L forearm, excised 03/21/2008: Squamous cell carcinoma of skin     Comment:  R lower leg, SCCIS excised 07/19/2008: Squamous cell carcinoma of skin     Comment:  R forearm 10/25/2012: Squamous cell carcinoma of skin      Comment:  L mid dorsum forearm 01/24/2014: Squamous cell carcinoma of skin     Comment:  L posterior lower leg  Past Surgical History: 09/18/2015: ANTERIOR CERVICAL DECOMP/DISCECTOMY FUSION; N/A     Comment:  Procedure: ANTERIOR CERVICAL DECOMPRESSION/DISCECTOMY               INTERBODY FUSION PLATING BONEGRAFT CERVICAL FIVE-SIX               ,CERVICAL SIX-SEVEN ;  Surgeon: Newman Pies, MD;                Location: [facility];  Service: Neurosurgery;                Laterality: N/A; No date: APPENDECTOMY No date: both shoulder surgery No date: BREAST BIOPSY; Right     Comment:  negative 2000 x 2  No date: CHOLECYSTECTOMY No date: COLONOSCOPY No date: EYE SURGERY 02/1996: NEUROPLASTY / TRANSPOSITION ULNAR NERVE AT ELBOW No date: OOPHORECTOMY No date: right knee surgery No date: skin cancer removed No date: TONSILLECTOMY  BMI    Body Mass Index: 24.55 kg/m      Reproductive/Obstetrics negative OB ROS                             Anesthesia Physical Anesthesia Plan  ASA: III  Anesthesia Plan: General   Post-op Pain Management:    Induction: Intravenous  PONV Risk Score and Plan: Propofol infusion and TIVA  Airway Management Planned: Natural Airway and  Nasal Cannula  Additional Equipment:   Intra-op Plan:   Post-operative Plan:   Informed Consent: I have reviewed the patients History and Physical, chart, labs and discussed the procedure including the risks, benefits and alternatives for the proposed anesthesia with the patient or authorized representative who has indicated his/her understanding and acceptance.     Dental Advisory Given  Plan Discussed with: Anesthesiologist, CRNA and Surgeon  Anesthesia Plan Comments: (Patient consented for risks of anesthesia including but not limited to:  - adverse reactions to medications - risk of intubation if required - damage to eyes, teeth, lips or other oral mucosa - nerve damage due to  positioning  - sore throat or hoarseness - Damage to heart, brain, nerves, lungs, other parts of body or loss of life  Patient voiced understanding.)        Anesthesia Quick Evaluation

## 2019-12-27 NOTE — Anesthesia Postprocedure Evaluation (Signed)
Anesthesia Post Note  Patient: Taylor Hunt  Procedure(s) Performed: COLONOSCOPY WITH PROPOFOL (N/A )  Patient location during evaluation: Endoscopy Anesthesia Type: General Level of consciousness: awake and alert Pain management: pain level controlled Vital Signs Assessment: post-procedure vital signs reviewed and stable Respiratory status: spontaneous breathing, nonlabored ventilation, respiratory function stable and patient connected to nasal cannula oxygen Cardiovascular status: blood pressure returned to baseline and stable Postop Assessment: no apparent nausea or vomiting Anesthetic complications: no   No complications documented.   Last Vitals:  Vitals:   12/27/19 0954 12/27/19 1004  BP: 133/87 132/81  Pulse:    Resp:    Temp:    SpO2:      Last Pain:  Vitals:   12/27/19 1004  TempSrc:   PainSc: 0-No pain                 Precious Haws Izaiah Tabb

## 2019-12-27 NOTE — Op Note (Signed)
[facility] Patient Name: Taylor Hunt Procedure Date: 12/27/2019 8:59 AM MRN: [id number] Account #: [id number] Date of Birth: 10/12/1950 Admit Type: Outpatient Age: 69 Room: [facility] ENDO ROOM 1 Gender: Female Note Status: Finalized Procedure:             Colonoscopy Indications:           High risk colon cancer surveillance: Personal history                         of colonic polyps Providers:             Robert Bellow, MD Medicines:             Monitored Anesthesia Care Complications:         No immediate complications. Procedure:             Pre-Anesthesia Assessment:                        - Prior to the procedure, a History and Physical was                         performed, and patient medications, allergies and                         sensitivities were reviewed. The patient's tolerance                         of previous anesthesia was reviewed.                        - The risks and benefits of the procedure and the                         sedation options and risks were discussed with the                         patient. All questions were answered and informed                         consent was obtained.                        After obtaining informed consent, the colonoscope was                         passed under direct vision. Throughout the procedure,                         the patient's blood pressure, pulse, and oxygen                         saturations were monitored continuously. The                         Colonoscope was introduced through the anus and                         advanced to the the cecum, identified by appendiceal  orifice and ileocecal valve. The patient tolerated the                         procedure well. The quality of the bowel preparation                         was excellent. The colonoscopy was somewhat difficult                         due to a tortuous colon. Successful  completion of the                         procedure was aided by using manual pressure. Findings:      The entire examined colon appeared normal on direct and retroflexion       views. Impression:            - The entire examined colon is normal on direct and                         retroflexion views.                        - No specimens collected. Recommendation:        - Repeat colonoscopy in 5 years for surveillance. Procedure Code(s):     --- Professional ---                        [phone], Colonoscopy, flexible; diagnostic, including                         collection of specimen(s) by brushing or washing, when                         performed (separate procedure) Diagnosis Code(s):     --- Professional ---                        Z86.010, Personal history of colonic polyps CPT copyright 2019 American Medical Association. All rights reserved. The codes documented in this report are preliminary and upon coder review may  be revised to meet current compliance requirements. Robert Bellow, MD 12/27/2019 9:32:07 AM This report has been signed electronically. Number of Addenda: 0 Note Initiated On: 12/27/2019 8:59 AM Scope Withdrawal Time: 0 hours 8 minutes 46 seconds  Total Procedure Duration: 0 hours 19 minutes 45 seconds  Estimated Blood Loss:  Estimated blood loss: none.      [facility]

## 2019-12-28 ENCOUNTER — Encounter: Payer: Self-pay | Admitting: General Surgery

## 2020-01-22 ENCOUNTER — Ambulatory Visit (INDEPENDENT_AMBULATORY_CARE_PROVIDER_SITE_OTHER): Payer: Medicare Other | Admitting: Dermatology

## 2020-01-22 ENCOUNTER — Encounter: Payer: Self-pay | Admitting: Dermatology

## 2020-01-22 ENCOUNTER — Other Ambulatory Visit: Payer: Self-pay

## 2020-01-22 DIAGNOSIS — D1800 Hemangioma unspecified site: Secondary | ICD-10-CM

## 2020-01-22 DIAGNOSIS — L578 Other skin changes due to chronic exposure to nonionizing radiation: Secondary | ICD-10-CM

## 2020-01-22 DIAGNOSIS — D229 Melanocytic nevi, unspecified: Secondary | ICD-10-CM | POA: Diagnosis not present

## 2020-01-22 DIAGNOSIS — L814 Other melanin hyperpigmentation: Secondary | ICD-10-CM

## 2020-01-22 DIAGNOSIS — L720 Epidermal cyst: Secondary | ICD-10-CM | POA: Diagnosis not present

## 2020-01-22 DIAGNOSIS — L820 Inflamed seborrheic keratosis: Secondary | ICD-10-CM | POA: Diagnosis not present

## 2020-01-22 DIAGNOSIS — L570 Actinic keratosis: Secondary | ICD-10-CM | POA: Diagnosis not present

## 2020-01-22 DIAGNOSIS — Z86018 Personal history of other benign neoplasm: Secondary | ICD-10-CM

## 2020-01-22 DIAGNOSIS — L821 Other seborrheic keratosis: Secondary | ICD-10-CM

## 2020-01-22 DIAGNOSIS — Z85828 Personal history of other malignant neoplasm of skin: Secondary | ICD-10-CM

## 2020-01-22 DIAGNOSIS — Z86007 Personal history of in-situ neoplasm of skin: Secondary | ICD-10-CM

## 2020-01-22 DIAGNOSIS — Z1283 Encounter for screening for malignant neoplasm of skin: Secondary | ICD-10-CM | POA: Diagnosis not present

## 2020-01-22 NOTE — Patient Instructions (Addendum)

## 2020-01-22 NOTE — Progress Notes (Signed)
Follow-Up Visit   Subjective  Taylor Hunt is a 69 y.o. female who presents for the following: TBSE. Patient presents today for TBSE, patient does not have any concerns today. Patient does have an extensive history of SCC. L. Post leg 01/24/14, L mid dorsum forearm 10/25/12, R lower leg 03/01/18, R Lower leg (SCCis) 10/25/17, and L forearm 08/25/06 and history of Moderate dysplastic R mid ant. Thigh 11/07/12 The patient presents for Total-Body Skin Exam (TBSE) for skin cancer screening and mole check.  The following portions of the chart were reviewed this encounter and updated as appropriate:  Tobacco  Allergies  Meds  Problems  Med Hx  Surg Hx  Fam Hx     Review of Systems:  No other skin or systemic complaints except as noted in HPI or Assessment and Plan.  Objective  Well appearing patient in no apparent distress; mood and affect are within normal limits.  A full examination was performed including scalp, head, eyes, ears, nose, lips, neck, chest, axillae, abdomen, back, buttocks, bilateral upper extremities, bilateral lower extremities, hands, feet, fingers, toes, fingernails, and toenails. All findings within normal limits unless otherwise noted below.  Objective  Left Preauricular Area, Mid Tip of Nose: Erythematous thin papules/macules with gritty scale.   Objective  Right Lower Eyelid : Smooth white papule(s).   Objective  Right  Back, Right Hip, Right Wrist - Anterior: Erythematous keratotic or waxy stuck-on papule or plaque.    Assessment & Plan  AK (actinic keratosis) (2) Left Preauricular Area; Mid Tip of Nose  Cryotherapy today Prior to procedure, discussed risks of blister formation, small wound, skin dyspigmentation, or rare scar following cryotherapy.    Destruction of lesion - Left Preauricular Area, Mid Tip of Nose Complexity: simple   Destruction method: cryotherapy   Informed consent: discussed and consent obtained   Timeout:  patient name, date of  birth, surgical site, and procedure verified Lesion destroyed using liquid nitrogen: Yes   Region frozen until ice ball extended beyond lesion: Yes   Outcome: patient tolerated procedure well with no complications   Post-procedure details: wound care instructions given    [place] Right Lower Eyelid   Benign, observe.    Inflamed seborrheic keratosis (3) Right Wrist - Anterior; Right Hip; Right  Back  Cryotherapy today Prior to procedure, discussed risks of blister formation, small wound, skin dyspigmentation, or rare scar following cryotherapy.    Destruction of lesion - Right  Back, Right Hip, Right Wrist - Anterior Complexity: simple   Destruction method: cryotherapy   Informed consent: discussed and consent obtained   Timeout:  patient name, date of birth, surgical site, and procedure verified Lesion destroyed using liquid nitrogen: Yes   Region frozen until ice ball extended beyond lesion: Yes   Outcome: patient tolerated procedure well with no complications   Post-procedure details: wound care instructions given    Skin cancer screening   Lentigines - Scattered tan macules - Discussed due to sun exposure - Benign, observe - Call for any changes  Seborrheic Keratoses - Stuck-on, waxy, tan-brown papules and plaques  - Discussed benign etiology and prognosis. - Observe - Call for any changes  Melanocytic Nevi - Tan-brown and/or pink-flesh-colored symmetric macules and papules - Benign appearing on exam today - Observation - Call clinic for new or changing moles - Recommend daily use of broad spectrum spf 30+ sunscreen to sun-exposed areas.   Hemangiomas - Red papules - Discussed benign nature - Observe - Call for any  changes  Actinic Damage - diffuse scaly erythematous macules with underlying dyspigmentation - Recommend daily broad spectrum sunscreen SPF 30+ to sun-exposed areas, reapply every 2 hours as needed.  - Call for new or changing  lesions.  History of Squamous Cell Carcinoma of the Skin L. Post lower leg 01/24/14, L mid dorsum forarm 10/25/12, R lower leg 10/26/07, L forearm 08/25/06 - No evidence of recurrence today - No lymphadenopathy - Recommend regular full body skin exams - Recommend daily broad spectrum sunscreen SPF 30+ to sun-exposed areas, reapply every 2 hours as needed.  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  - Call if any new or changing lesions are noted between office visits  History of Dysplastic Nevi Moderate dysplastic R Mid Ant. Thigh 11/07/12 - No evidence of recurrence today - Recommend regular full body skin exams - Recommend daily broad spectrum sunscreen SPF 30+ to sun-exposed areas, reapply every 2 hours as needed.  - Call if any new or changing lesions are noted between office visits  Skin cancer screening performed today.  Return in about 6 months (around 07/21/2020) for AK Follow up/ isk.  I, Donzetta Kohut, CMA, am acting as scribe for Sarina Ser, MD . Documentation: I have reviewed the above documentation for accuracy and completeness, and I agree with the above.  Sarina Ser, MD

## 2020-02-12 ENCOUNTER — Ambulatory Visit: Payer: Medicare Other | Attending: Internal Medicine

## 2020-02-12 ENCOUNTER — Other Ambulatory Visit: Payer: Self-pay | Admitting: Internal Medicine

## 2020-02-12 DIAGNOSIS — Z23 Encounter for immunization: Secondary | ICD-10-CM

## 2020-02-12 DIAGNOSIS — Z1231 Encounter for screening mammogram for malignant neoplasm of breast: Secondary | ICD-10-CM

## 2020-02-12 NOTE — Progress Notes (Signed)
   Covid-19 Vaccination Clinic  Name:  AZHA CONSTANTIN    MRN: [id number] DOB: [birth year]  02/12/2020  Ms. Littles was observed post Covid-19 immunization for 30 minutes based on pre-vaccination screening without incident. She was provided with Vaccine Information Sheet and instruction to access the V-Safe system.   Ms. Mckee was instructed to call 911 with any severe reactions post vaccine: Marland Kitchen Difficulty breathing  . Swelling of face and throat  . A fast heartbeat  . A bad rash all over body  . Dizziness and weakness

## 2020-04-02 ENCOUNTER — Ambulatory Visit
Admission: RE | Admit: 2020-04-02 | Discharge: 2020-04-02 | Disposition: A | Discharge status: Home / Self Care | Payer: Medicare Other | Source: Ambulatory Visit | Attending: Internal Medicine | Admitting: Internal Medicine

## 2020-04-02 ENCOUNTER — Other Ambulatory Visit: Payer: Self-pay

## 2020-04-02 DIAGNOSIS — Z1231 Encounter for screening mammogram for malignant neoplasm of breast: Secondary | ICD-10-CM

## 2020-07-29 ENCOUNTER — Ambulatory Visit: Payer: Medicare Other | Admitting: Dermatology

## 2020-07-30 ENCOUNTER — Other Ambulatory Visit: Payer: Self-pay

## 2020-07-30 ENCOUNTER — Encounter: Payer: Self-pay | Admitting: Dermatology

## 2020-07-30 ENCOUNTER — Ambulatory Visit (INDEPENDENT_AMBULATORY_CARE_PROVIDER_SITE_OTHER): Payer: Medicare Other | Admitting: Dermatology

## 2020-07-30 DIAGNOSIS — L578 Other skin changes due to chronic exposure to nonionizing radiation: Secondary | ICD-10-CM

## 2020-07-30 DIAGNOSIS — L570 Actinic keratosis: Secondary | ICD-10-CM

## 2020-07-30 DIAGNOSIS — L814 Other melanin hyperpigmentation: Secondary | ICD-10-CM

## 2020-07-30 DIAGNOSIS — D229 Melanocytic nevi, unspecified: Secondary | ICD-10-CM

## 2020-07-30 DIAGNOSIS — T148XXA Other injury of unspecified body region, initial encounter: Secondary | ICD-10-CM | POA: Diagnosis not present

## 2020-07-30 DIAGNOSIS — L820 Inflamed seborrheic keratosis: Secondary | ICD-10-CM

## 2020-07-30 NOTE — Patient Instructions (Addendum)
Prior to procedure, discussed risks of blister formation, small wound, skin dyspigmentation, or rare scar following cryotherapy.    Cryotherapy Aftercare  . Wash gently with soap and water everyday.   Marland Kitchen Apply Vaseline and Band-Aid daily until healed.   Recommend taking Heliocare sun protection supplement daily in sunny weather for additional sun protection. For maximum protection on the sunniest days, you can take up to 2 capsules of regular Heliocare OR take 1 capsule of Heliocare Ultra. For prolonged exposure (such as a full day in the sun), you can repeat your dose of the supplement 4 hours after your first dose. Heliocare can be purchased at [facility] or at [email or web address].    Recommend Nicotinamide or Niacinamide 500mg  twice per day to lower risk of non-melanoma skin cancer by approximately 25%.  Available at [employer].

## 2020-07-30 NOTE — Progress Notes (Signed)
Follow-Up Visit   Subjective  Taylor Hunt is a 70 y.o. female who presents for the following: Follow-up (6 months f/u hx of Aks, ISKs, pt concerned about several spots on her arms and legs changing and bleeding ) and Nevus (Check moles on the back ).  The following portions of the chart were reviewed this encounter and updated as appropriate:   Tobacco  Allergies  Meds  Problems  Med Hx  Surg Hx  Fam Hx      Review of Systems:  No other skin or systemic complaints except as noted in HPI or Assessment and Plan.  Objective  Well appearing patient in no apparent distress; mood and affect are within normal limits.  A focused examination was performed including face, scalp, back, legs, ears. Relevant physical exam findings are noted in the Assessment and Plan.  Objective  Right upper leg x 1, left helix x 1, right lateral brow x 1, right nasal bridge x 1 (4): Erythematous thin papules/macules with gritty scale.   Objective  L temple x 1, R temporal scalp x 1, L anterior thigh x 1, R upper back x 1 (4): Erythematous keratotic or waxy stuck-on papule or plaque.   Objective  Left pretibial: Excoriation    Assessment & Plan  AK (actinic keratosis) (4) Right upper leg x 1, left helix x 1, right lateral brow x 1, right nasal bridge x 1  Prior to procedure, discussed risks of blister formation, small wound, skin dyspigmentation, or rare scar following cryotherapy.    Destruction of lesion - Right upper leg x 1, left helix x 1, right lateral brow x 1, right nasal bridge x 1 Complexity: simple   Destruction method: cryotherapy   Informed consent: discussed and consent obtained   Timeout:  patient name, date of birth, surgical site, and procedure verified Lesion destroyed using liquid nitrogen: Yes   Region frozen until ice ball extended beyond lesion: Yes   Outcome: patient tolerated procedure well with no complications   Post-procedure details: wound care instructions given     Inflamed seborrheic keratosis (4) L temple x 1, R temporal scalp x 1, L anterior thigh x 1, R upper back x 1  Prior to procedure, discussed risks of blister formation, small wound, skin dyspigmentation, or rare scar following cryotherapy.    Destruction of lesion - L temple x 1, R temporal scalp x 1, L anterior thigh x 1, R upper back x 1 Complexity: simple   Destruction method: cryotherapy   Informed consent: discussed and consent obtained   Timeout:  patient name, date of birth, surgical site, and procedure verified Lesion destroyed using liquid nitrogen: Yes   Region frozen until ice ball extended beyond lesion: Yes   Outcome: patient tolerated procedure well with no complications   Post-procedure details: wound care instructions given    Excoriation Left pretibial  Excoriation within SK favored > AK within SK  Apply Vaseline and Band-Aid daily until healed.   If excoriated area does not heal/resolve, pt will call  Melanocytic Nevi back - Tan-brown and/or pink-flesh-colored symmetric macules and papules - Benign appearing on exam today - Observation - Call clinic for new or changing moles - Recommend daily use of broad spectrum spf 30+ sunscreen to sun-exposed areas.   Lentigines Back  - Scattered tan macules - Due to sun exposure - Benign-appering, observe - Recommend daily broad spectrum sunscreen SPF 30+ to sun-exposed areas, reapply every 2 hours as needed. - Call for  any changes  Actinic Damage - chronic, secondary to cumulative UV radiation exposure/sun exposure over time - diffuse scaly erythematous macules with underlying dyspigmentation - Recommend daily broad spectrum sunscreen SPF 30+ to sun-exposed areas, reapply every 2 hours as needed.  - Recommend staying in the shade or wearing long sleeves, sun glasses (UVA+UVB protection) and wide brim hats (4-inch brim around the entire circumference of the hat). - Call for new or changing lesions.  Return in  about 6 months (around 01/30/2021) for TBSE.  I, [name], CMA, am acting as scribe for [name], MD .  Documentation: I have reviewed the above documentation for accuracy and completeness, and I agree with the above.  [name], MD

## 2020-07-31 ENCOUNTER — Encounter: Payer: Self-pay | Admitting: Dermatology

## 2020-10-24 ENCOUNTER — Other Ambulatory Visit: Payer: Self-pay

## 2020-10-24 ENCOUNTER — Ambulatory Visit (INDEPENDENT_AMBULATORY_CARE_PROVIDER_SITE_OTHER): Payer: Medicare Other | Admitting: Dermatology

## 2020-10-24 DIAGNOSIS — L820 Inflamed seborrheic keratosis: Secondary | ICD-10-CM | POA: Diagnosis not present

## 2020-10-24 DIAGNOSIS — T148XXA Other injury of unspecified body region, initial encounter: Secondary | ICD-10-CM

## 2020-10-24 DIAGNOSIS — D485 Neoplasm of uncertain behavior of skin: Secondary | ICD-10-CM

## 2020-10-24 DIAGNOSIS — R238 Other skin changes: Secondary | ICD-10-CM | POA: Diagnosis not present

## 2020-10-24 DIAGNOSIS — L720 Epidermal cyst: Secondary | ICD-10-CM

## 2020-10-24 DIAGNOSIS — C4491 Basal cell carcinoma of skin, unspecified: Secondary | ICD-10-CM

## 2020-10-24 DIAGNOSIS — L578 Other skin changes due to chronic exposure to nonionizing radiation: Secondary | ICD-10-CM

## 2020-10-24 DIAGNOSIS — Z86018 Personal history of other benign neoplasm: Secondary | ICD-10-CM

## 2020-10-24 DIAGNOSIS — Z85828 Personal history of other malignant neoplasm of skin: Secondary | ICD-10-CM

## 2020-10-24 DIAGNOSIS — C44311 Basal cell carcinoma of skin of nose: Secondary | ICD-10-CM | POA: Diagnosis not present

## 2020-10-24 HISTORY — DX: Basal cell carcinoma of skin, unspecified: C44.91

## 2020-10-24 MED ORDER — MUPIROCIN 2 % EX OINT: 1.0000 "application " | TOPICAL_OINTMENT | Freq: Every day | CUTANEOUS | 1 refills | Status: DC

## 2020-10-24 NOTE — Patient Instructions (Addendum)
Wound Care Instructions  Cleanse wound gently with soap and water once a day then pat dry with clean gauze. Apply a thing coat of Petrolatum (petroleum jelly, "Vaseline") over the wound (unless you have an allergy to this). We recommend that you use a new, sterile tube of Vaseline. Do not pick or remove scabs. Do not remove the yellow or white "healing tissue" from the base of the wound.  Cover the wound with fresh, clean, nonstick gauze and secure with paper tape. You may use Band-Aids in place of gauze and tape if the would is small enough, but would recommend trimming much of the tape off as there is often too much. Sometimes Band-Aids can irritate the skin.  You should call the office for your biopsy report after 1 week if you have not already been contacted.  If you experience any problems, such as abnormal amounts of bleeding, swelling, significant bruising, significant pain, or evidence of infection, please call the office immediately.  Cryotherapy Aftercare  Wash gently with soap and water everyday.   Apply Vaseline and Band-Aid daily until healed.   Prior to procedure, discussed risks of blister formation, small wound, skin dyspigmentation, or rare scar following cryotherapy. Recommend Vaseline ointment to treated areas while healing.  Recommend taking Heliocare sun protection supplement daily in sunny weather for additional sun protection. For maximum protection on the sunniest days, you can take up to 2 capsules of regular Heliocare OR take 1 capsule of Heliocare Ultra. For prolonged exposure (such as a full day in the sun), you can repeat your dose of the supplement 4 hours after your first dose. Heliocare can be purchased at [facility] or at [email or web address].    Recommend daily broad spectrum sunscreen SPF 30+ to sun-exposed areas, reapply every 2 hours as needed. Call for new or changing lesions.  Staying in the shade or wearing long sleeves, sun glasses (UVA+UVB  protection) and wide brim hats (4-inch brim around the entire circumference of the hat) are also recommended for sun protection.   If you have any questions or concerns for your doctor, please call our main line at [phone] and press option 4 to reach your doctor's medical assistant. If no one answers, please leave a voicemail as directed and we will return your call as soon as possible. Messages left after 4 pm will be answered the following business day.   You may also send [place] a message via [place]. We typically respond to MyChart messages within 1-2 business days.  For prescription refills, please ask your pharmacy to contact our office. Our fax number is [phone].  If you have an urgent issue when the clinic is closed that cannot wait until the next business day, you can page your doctor at the number below.    Please note that while we do our best to be available for urgent issues outside of office hours, we are not available 24/7.   If you have an urgent issue and are unable to reach [place], you may choose to seek medical care at your doctor's office, retail clinic, urgent care center, or emergency room.  If you have a medical emergency, please immediately call 911 or go to the emergency department.  Pager Numbers  - Dr. Nehemiah Massed: [phone]  - Dr. Laurence Ferrari: [phone]  - Dr. Nicole Kindred: [phone]  In the event of inclement weather, please call our main line at [phone] for an update on the status of any delays or closures.  Dermatology Medication Tips: Please  keep the boxes that topical medications come in in order to help keep track of the instructions about where and how to use these. Pharmacies typically print the medication instructions only on the boxes and not directly on the medication tubes.   If your medication is too expensive, please contact our office at [phone] option 4 or send [place] a message through [place].   We are unable to tell what your co-pay for  medications will be in advance as this is different depending on your insurance coverage. However, we may be able to find a substitute medication at lower cost or fill out paperwork to get insurance to cover a needed medication.   If a prior authorization is required to get your medication covered by your insurance company, please allow [place] 1-2 business days to complete this process.  Drug prices often vary depending on where the prescription is filled and some pharmacies may offer cheaper prices.  The website www.goodrx.com contains coupons for medications through different pharmacies. The prices here do not account for what the cost may be with help from insurance (it may be cheaper with your insurance), but the website can give you the price if you did not use any insurance.  - You can print the associated coupon and take it with your prescription to the pharmacy.  - You may also stop by our office during regular business hours and pick up a GoodRx coupon card.  - If you need your prescription sent electronically to a different pharmacy, notify our office through [facility] or by phone at [phone] option 4.

## 2020-10-24 NOTE — Progress Notes (Signed)
Follow-Up Visit   Subjective  Taylor Hunt is a 70 y.o. female who presents for the following: Skin Problem (Patient here today for a spot at right lower leg, present for a few days, not painful for patient. She also has a spot that has come up on her nose, one at bottom lip. ).  Patient also would like to know if there is anything she can use on small wounds from a new puppy.   The following portions of the chart were reviewed this encounter and updated as appropriate:   Tobacco  Allergies  Meds  Problems  Med Hx  Surg Hx  Fam Hx       Review of Systems:  No other skin or systemic complaints except as noted in HPI or Assessment and Plan.  Objective  Well appearing patient in no apparent distress; mood and affect are within normal limits.  A focused examination was performed including legs, face, arms. Relevant physical exam findings are noted in the Assessment and Plan.  Right nasal tip 0.4cm pink papue R/o sebaceous hyperplasia vs BCC      right nose excoriation  Mid Lower Cutaneous Lip 0.3cm firm pink papule     Right temporal scalp Erythematous keratotic or waxy stuck-on papule or plaque.   Right calf Inflammatory papule   Assessment & Plan  Neoplasm of uncertain behavior of skin Right nasal tip  Skin / nail biopsy Type of biopsy: tangential   Informed consent: discussed and consent obtained   Timeout: patient name, date of birth, surgical site, and procedure verified   Patient was prepped and draped in usual sterile fashion: Area prepped with isopropyl alcohol. Anesthesia: the lesion was anesthetized in a standard fashion   Anesthetic:  1% lidocaine w/ epinephrine 1-100,000 buffered w/ 8.4% NaHCO3 Instrument used: flexible razor blade   Hemostasis achieved with: aluminum chloride   Outcome: patient tolerated procedure well   Post-procedure details: wound care instructions given   Additional details:  Mupirocin and a bandage applied  Specimen 1  - Surgical pathology Differential Diagnosis: R/o sebaceous hyperplasia vs BCC  Check Margins: No 0.4cm pink papue   Excoriation right nose  Benign-appearing. Recommend mupirocin daily as needed. Call for worsening, Recheck at follow-up.  mupirocin ointment (BACTROBAN) 2 % - right nose Apply 1 application topically daily.  [place] Mid Lower Cutaneous Lip  Patient may schedule for punch biopsy to remove.   Inflamed seborrheic keratosis Right temporal scalp  Prior to procedure, discussed risks of blister formation, small wound, skin dyspigmentation, or rare scar following cryotherapy. Recommend Vaseline ointment to treated areas while healing.   Destruction of lesion - Right temporal scalp  Destruction method: cryotherapy   Informed consent: discussed and consent obtained   Lesion destroyed using liquid nitrogen: Yes   Cryotherapy cycles:  2 Outcome: patient tolerated procedure well with no complications   Post-procedure details: wound care instructions given    Inflammatory papule Right calf  Benign-appearing.  Recheck at follow-up.  Call clinic for new or changing lesions.  Recommend daily use of broad spectrum spf 30+ sunscreen to sun-exposed areas.    Actinic Damage - chronic, secondary to cumulative UV radiation exposure/sun exposure over time - diffuse scaly erythematous macules with underlying dyspigmentation - Recommend daily broad spectrum sunscreen SPF 30+ to sun-exposed areas, reapply every 2 hours as needed.  - Recommend staying in the shade or wearing long sleeves, sun glasses (UVA+UVB protection) and wide brim hats (4-inch brim around the entire circumference of  the hat). - Call for new or changing lesions.  History of Dysplastic Nevi - No evidence of recurrence today - Recommend regular full body skin exams - Recommend daily broad spectrum sunscreen SPF 30+ to sun-exposed areas, reapply every 2 hours as needed.  - Call if any new or changing lesions are  noted between office visits  History of Squamous Cell Carcinoma of the Skin - No evidence of recurrence today - No lymphadenopathy - Recommend regular full body skin exams - Recommend daily broad spectrum sunscreen SPF 30+ to sun-exposed areas, reapply every 2 hours as needed.  - Call if any new or changing lesions are noted between office visits  Return in about 2 months (around 12/24/2020) for recheck inflammatory papule at leg, excoriation at nose, punch bx chin, FBSE.  Graciella Belton, RMA, am acting as scribe for [name], MD .  Documentation: I have reviewed the above documentation for accuracy and completeness, and I agree with the above.  [name], MD

## 2020-10-30 ENCOUNTER — Other Ambulatory Visit: Payer: Self-pay | Admitting: Internal Medicine

## 2020-10-30 ENCOUNTER — Other Ambulatory Visit (HOSPITAL_COMMUNITY): Payer: Self-pay | Admitting: Internal Medicine

## 2020-10-30 DIAGNOSIS — R1032 Left lower quadrant pain: Secondary | ICD-10-CM

## 2020-10-31 ENCOUNTER — Other Ambulatory Visit: Payer: Self-pay

## 2020-10-31 ENCOUNTER — Telehealth: Payer: Self-pay | Admitting: Dermatology

## 2020-10-31 ENCOUNTER — Telehealth: Payer: Self-pay

## 2020-10-31 DIAGNOSIS — C44311 Basal cell carcinoma of skin of nose: Secondary | ICD-10-CM

## 2020-10-31 NOTE — Telephone Encounter (Signed)
Reviewed results with patient. Will refer for Mohs at [facility].

## 2020-10-31 NOTE — Telephone Encounter (Signed)
Error

## 2020-11-01 ENCOUNTER — Ambulatory Visit
Admission: RE | Admit: 2020-11-01 | Discharge: 2020-11-01 | Disposition: A | Discharge status: Home / Self Care | Payer: Medicare Other | Source: Ambulatory Visit | Attending: Internal Medicine | Admitting: Internal Medicine

## 2020-11-01 ENCOUNTER — Other Ambulatory Visit: Payer: Self-pay

## 2020-11-01 DIAGNOSIS — R1032 Left lower quadrant pain: Secondary | ICD-10-CM | POA: Insufficient documentation

## 2020-12-02 ENCOUNTER — Encounter: Payer: Self-pay | Admitting: Dermatology

## 2020-12-31 ENCOUNTER — Ambulatory Visit (INDEPENDENT_AMBULATORY_CARE_PROVIDER_SITE_OTHER): Payer: Medicare Other | Admitting: Dermatology

## 2020-12-31 ENCOUNTER — Ambulatory Visit: Payer: Medicare Other | Admitting: Dermatology

## 2020-12-31 ENCOUNTER — Other Ambulatory Visit: Payer: Self-pay

## 2020-12-31 DIAGNOSIS — Z85828 Personal history of other malignant neoplasm of skin: Secondary | ICD-10-CM

## 2020-12-31 DIAGNOSIS — L814 Other melanin hyperpigmentation: Secondary | ICD-10-CM

## 2020-12-31 DIAGNOSIS — L820 Inflamed seborrheic keratosis: Secondary | ICD-10-CM | POA: Diagnosis not present

## 2020-12-31 DIAGNOSIS — L578 Other skin changes due to chronic exposure to nonionizing radiation: Secondary | ICD-10-CM

## 2020-12-31 DIAGNOSIS — L821 Other seborrheic keratosis: Secondary | ICD-10-CM

## 2020-12-31 DIAGNOSIS — D489 Neoplasm of uncertain behavior, unspecified: Secondary | ICD-10-CM

## 2020-12-31 DIAGNOSIS — Z86018 Personal history of other benign neoplasm: Secondary | ICD-10-CM

## 2020-12-31 DIAGNOSIS — Z1283 Encounter for screening for malignant neoplasm of skin: Secondary | ICD-10-CM

## 2020-12-31 DIAGNOSIS — L570 Actinic keratosis: Secondary | ICD-10-CM

## 2020-12-31 DIAGNOSIS — D485 Neoplasm of uncertain behavior of skin: Secondary | ICD-10-CM | POA: Diagnosis not present

## 2020-12-31 DIAGNOSIS — B009 Herpesviral infection, unspecified: Secondary | ICD-10-CM

## 2020-12-31 DIAGNOSIS — D1800 Hemangioma unspecified site: Secondary | ICD-10-CM

## 2020-12-31 DIAGNOSIS — Z872 Personal history of diseases of the skin and subcutaneous tissue: Secondary | ICD-10-CM

## 2020-12-31 DIAGNOSIS — D229 Melanocytic nevi, unspecified: Secondary | ICD-10-CM

## 2020-12-31 MED ORDER — FLUOROURACIL 5 % EX CREA
TOPICAL_CREAM | Freq: Every day | CUTANEOUS | 0 refills | Status: DC
Start: 2020-12-31 — End: 2021-03-10

## 2020-12-31 MED ORDER — VALACYCLOVIR HCL 500 MG PO TABS: 500.0000 mg | ORAL_TABLET | Freq: Two times a day (BID) | ORAL | 0 refills | Status: DC

## 2020-12-31 MED ORDER — FLUOROURACIL 5 % EX CREA: TOPICAL_CREAM | Freq: Every day | CUTANEOUS | 0 refills | Status: DC

## 2020-12-31 NOTE — Patient Instructions (Addendum)
One day prior to starting 5FU at bottom lip, start valtrex '500mg'$  twice daily with water x 14 days.  Start 5-fluorouracil/calcipotriene cream once a day for 4 days to affected areas including lower lip. Prescription sent to [facility]. Patient provided with contact information for pharmacy and advised the pharmacy will mail the prescription to their home. Patient provided with handout reviewing treatment course and side effects and advised to call or message [place] on MyChart with any concerns.  Cryotherapy Aftercare  Wash gently with soap and water everyday.   Apply Vaseline and Band-Aid daily until healed.   Prior to procedure, discussed risks of blister formation, small wound, skin dyspigmentation, or rare scar following cryotherapy. Recommend Vaseline ointment to treated areas while healing.  Wound Care Instructions  Cleanse wound gently with soap and water once a day then pat dry with clean gauze. Apply a thing coat of Petrolatum (petroleum jelly, "Vaseline") over the wound (unless you have an allergy to this). We recommend that you use a new, sterile tube of Vaseline. Do not pick or remove scabs. Do not remove the yellow or white "healing tissue" from the base of the wound.  Cover the wound with fresh, clean, nonstick gauze and secure with paper tape. You may use Band-Aids in place of gauze and tape if the would is small enough, but would recommend trimming much of the tape off as there is often too much. Sometimes Band-Aids can irritate the skin.  You should call the office for your biopsy report after 1 week if you have not already been contacted.  If you experience any problems, such as abnormal amounts of bleeding, swelling, significant bruising, significant pain, or evidence of infection, please call the office immediately.  FOR ADULT SURGERY PATIENTS: If you need something for pain relief you may take 1 extra strength Tylenol (acetaminophen) AND 2 Ibuprofen ('200mg'$  each) together  every 4 hours as needed for pain. (do not take these if you are allergic to them or if you have a reason you should not take them.) Typically, you may only need pain medication for 1 to 3 days.   If you have any questions or concerns for your doctor, please call our main line at [phone] and press option 4 to reach your doctor's medical assistant. If no one answers, please leave a voicemail as directed and we will return your call as soon as possible. Messages left after 4 pm will be answered the following business day.   You may also send [place] a message via [place]. We typically respond to MyChart messages within 1-2 business days.  For prescription refills, please ask your pharmacy to contact our office. Our fax number is [phone].  If you have an urgent issue when the clinic is closed that cannot wait until the next business day, you can page your doctor at the number below.    Please note that while we do our best to be available for urgent issues outside of office hours, we are not available 24/7.   If you have an urgent issue and are unable to reach [place], you may choose to seek medical care at your doctor's office, retail clinic, urgent care center, or emergency room.  If you have a medical emergency, please immediately call 911 or go to the emergency department.  Pager Numbers  - Dr. Nehemiah Massed: [phone]  - Dr. Laurence Ferrari: [phone]  - Dr. Nicole Kindred: [phone]  In the event of inclement weather, please call our main line at [phone] for  an update on the status of any delays or closures.  Dermatology Medication Tips: Please keep the boxes that topical medications come in in order to help keep track of the instructions about where and how to use these. Pharmacies typically print the medication instructions only on the boxes and not directly on the medication tubes.   If your medication is too expensive, please contact our office at [phone] option 4 or send [place] a message  through [place].   We are unable to tell what your co-pay for medications will be in advance as this is different depending on your insurance coverage. However, we may be able to find a substitute medication at lower cost or fill out paperwork to get insurance to cover a needed medication.   If a prior authorization is required to get your medication covered by your insurance company, please allow [place] 1-2 business days to complete this process.  Drug prices often vary depending on where the prescription is filled and some pharmacies may offer cheaper prices.  The website www.goodrx.com contains coupons for medications through different pharmacies. The prices here do not account for what the cost may be with help from insurance (it may be cheaper with your insurance), but the website can give you the price if you did not use any insurance.  - You can print the associated coupon and take it with your prescription to the pharmacy.  - You may also stop by our office during regular business hours and pick up a GoodRx coupon card.  - If you need your prescription sent electronically to a different pharmacy, notify our office through  [facility] or by phone at [phone] option 4.

## 2020-12-31 NOTE — Progress Notes (Signed)
Follow-Up Visit   Subjective  Taylor Hunt is a 70 y.o. female who presents for the following: TBSE (Patient here for full body skin exam and skin cancer screening. Patient does have a hx of dysplastic nevi, SCC and BCC. There is a spot at left bottom lip that has been there for at least 3 weeks, felt like it was a fever blister. Patient also here today for a biopsy at mid bottom lip to r/o cyst.).  The following portions of the chart were reviewed this encounter and updated as appropriate:   Tobacco  Allergies  Meds  Problems  Med Hx  Surg Hx  Fam Hx      Review of Systems:  No other skin or systemic complaints except as noted in HPI or Assessment and Plan.  Objective  Well appearing patient in no apparent distress; mood and affect are within normal limits.  A full examination was performed including scalp, head, eyes, ears, nose, lips, neck, chest, axillae, abdomen, back, buttocks, bilateral upper extremities, bilateral lower extremities, hands, feet, fingers, toes, fingernails, and toenails. All findings within normal limits unless otherwise noted below.  Lips Erythematous thin papules/macules with gritty scale, diffuse over lower lip.   bottom lip Hx of fever blisters  Right pretibia Erythematous keratotic or waxy stuck-on papule or plaque.   Right Posterior Calf 0.8cm red papule with prominent telangiectasia      mid lower cutaneous lip 0.3cm skin colored erythematous papule Cyst vs nevus      Assessment & Plan  AK (actinic keratosis) Lips  Start 5-fluorouracil/calcipotriene cream once a day for 4 days to affected areas including lower lip. Prescription sent to [facility]. Patient provided with contact information for pharmacy and advised the pharmacy will mail the prescription to their home. Patient provided with handout reviewing treatment course and side effects and advised to call or message [place] on MyChart with any concerns.  Patient will start  valacyclovir '500mg'$  twice daily x 14 days, starting 1 day prior to 5FU/calcipotriene to prevent cold sores  Actinic keratoses are precancerous spots that appear secondary to cumulative UV radiation exposure/sun exposure over time. They are chronic with expected duration over 1 year. A portion of actinic keratoses will progress to squamous cell carcinoma of the skin. It is not possible to reliably predict which spots will progress to skin cancer and so treatment is recommended to prevent development of skin cancer.  Recommend daily broad spectrum sunscreen SPF 30+ to sun-exposed areas, reapply every 2 hours as needed.  Recommend staying in the shade or wearing long sleeves, sun glasses (UVA+UVB protection) and wide brim hats (4-inch brim around the entire circumference of the hat). Call for new or changing lesions.   Related Medications fluorouracil (EFUDEX) 5 % cream Apply topically daily. Dispense 15g compounded with calcipotriene. Apply once daily x 4 days to bottom lip. Start taking valacyclovir tablets as directed 24 hours before starting this cream.  Herpes simplex bottom lip  One day prior to starting 5FU, start valtrex '500mg'$  twice daily with water x 14 days.  valACYclovir (VALTREX) 500 MG tablet - bottom lip Take 1 tablet (500 mg total) by mouth 2 (two) times daily for 14 days.  Inflamed seborrheic keratosis Right pretibia  Prior to procedure, discussed risks of blister formation, small wound, skin dyspigmentation, or rare scar following cryotherapy. Recommend Vaseline ointment to treated areas while healing.   Destruction of lesion - Right pretibia  Destruction method: cryotherapy   Informed consent: discussed and  consent obtained   Lesion destroyed using liquid nitrogen: Yes   Cryotherapy cycles:  2 Outcome: patient tolerated procedure well with no complications   Post-procedure details: wound care instructions given    Neoplasm of uncertain behavior of skin Right  Posterior Calf  Skin / nail biopsy Type of biopsy: punch   Informed consent: discussed and consent obtained   Timeout: patient name, date of birth, surgical site, and procedure verified   Patient was prepped and draped in usual sterile fashion: Area prepped with isopropyl alcohol. Anesthesia: the lesion was anesthetized in a standard fashion   Anesthetic:  1% lidocaine w/ epinephrine 1-100,000 buffered w/ 8.4% NaHCO3 Punch size:  4 mm Suture size:  4-0 Suture type: Prolene (polypropylene)   Suture removal (days):  14 Hemostasis achieved with: suture and aluminum chloride   Outcome: patient tolerated procedure well   Post-procedure details: wound care instructions given   Additional details:  Mupirocin and a dressing applied  Specimen 2 - Surgical pathology Differential Diagnosis: R/o amelanotic   Check Margins: No 0.8cm red papule with prominent telangiectasia  Neoplasm of uncertain behavior mid lower cutaneous lip  Skin / nail biopsy Type of biopsy: punch   Informed consent: discussed and consent obtained   Timeout: patient name, date of birth, surgical site, and procedure verified   Patient was prepped and draped in usual sterile fashion: Area prepped with isopropyl alcohol. Anesthesia: the lesion was anesthetized in a standard fashion   Anesthetic:  1% lidocaine w/ epinephrine 1-100,000 buffered w/ 8.4% NaHCO3 Punch size:  3 mm Suture size:  5-0 Suture type: Prolene (polypropylene)   Suture removal (days):  7 Hemostasis achieved with: suture and aluminum chloride   Outcome: patient tolerated procedure well   Post-procedure details: wound care instructions given   Additional details:  Mupirocin and a dressing applied  Specimen 1 - Surgical pathology Differential Diagnosis: Cyst vs nevus  Check Margins: No 0.3cm skin colored erythematous papule   symptomatic  Lentigines - Scattered tan macules - Due to sun exposure - Benign-appering, observe - Recommend daily  broad spectrum sunscreen SPF 30+ to sun-exposed areas, reapply every 2 hours as needed. - Call for any changes  Seborrheic Keratoses - Stuck-on, waxy, tan-brown papules and/or plaques  - Benign-appearing - Discussed benign etiology and prognosis. - Observe - Call for any changes  Melanocytic Nevi - Tan-brown and/or pink-flesh-colored symmetric macules and papules - Benign appearing on exam today - Observation - Call clinic for new or changing moles - Recommend daily use of broad spectrum spf 30+ sunscreen to sun-exposed areas.   Hemangiomas - Red papules - Discussed benign nature - Observe - Call for any changes  Actinic Damage - Chronic condition, secondary to cumulative UV/sun exposure - diffuse scaly erythematous macules with underlying dyspigmentation - Recommend daily broad spectrum sunscreen SPF 30+ to sun-exposed areas, reapply every 2 hours as needed.  - Staying in the shade or wearing long sleeves, sun glasses (UVA+UVB protection) and wide brim hats (4-inch brim around the entire circumference of the hat) are also recommended for sun protection.  - Call for new or changing lesions.  Skin cancer screening performed today.  History of Basal Cell Carcinoma of the Skin - No evidence of recurrence today - Recommend regular full body skin exams - Recommend daily broad spectrum sunscreen SPF 30+ to sun-exposed areas, reapply every 2 hours as needed.  - Call if any new or changing lesions are noted between office visits  History of Dysplastic Nevi -  No evidence of recurrence today - Recommend regular full body skin exams - Recommend daily broad spectrum sunscreen SPF 30+ to sun-exposed areas, reapply every 2 hours as needed.  - Call if any new or changing lesions are noted between office visits  History of Squamous Cell Carcinoma of the Skin - No evidence of recurrence today - No lymphadenopathy - Recommend regular full body skin exams - Recommend daily broad spectrum  sunscreen SPF 30+ to sun-exposed areas, reapply every 2 hours as needed.  - Call if any new or changing lesions are noted between office visits  History of PreCancerous Actinic Keratosis  - site(s) of PreCancerous Actinic Keratosis clear today. - these may recur and new lesions may form requiring treatment to prevent transformation into skin cancer - observe for new or changing spots and contact [place] for appointment if occur - photoprotection with sun protective clothing; sunglasses and broad spectrum sunscreen with SPF of at least 30 + and frequent self skin exams recommended - yearly exams by a dermatologist recommended for persons with history of PreCancerous Actinic Keratoses  Return in about 1 week (around 01/07/2021) for Suture Removal.  Graciella Belton, RMA, am acting as scribe for [name], MD .   Documentation: I have reviewed the above documentation for accuracy and completeness, and I agree with the above.  [name], MD

## 2021-01-04 ENCOUNTER — Encounter: Payer: Self-pay | Admitting: Dermatology

## 2021-01-07 ENCOUNTER — Encounter: Payer: Self-pay | Admitting: Dermatology

## 2021-01-07 ENCOUNTER — Other Ambulatory Visit: Payer: Self-pay

## 2021-01-07 ENCOUNTER — Ambulatory Visit (INDEPENDENT_AMBULATORY_CARE_PROVIDER_SITE_OTHER): Payer: Medicare Other | Admitting: Dermatology

## 2021-01-07 DIAGNOSIS — Z4802 Encounter for removal of sutures: Secondary | ICD-10-CM

## 2021-01-07 NOTE — Patient Instructions (Signed)
Recommend Serica moisturizing scar formula cream every night or [facility] brand or Mederma silicone scar sheet every night for the first year after a scar appears to help with scar remodeling if desired. Scars remodel on their own for a full year.  If you have any questions or concerns for your doctor, please call our main line at [phone] and press option 4 to reach your doctor's medical assistant. If no one answers, please leave a voicemail as directed and we will return your call as soon as possible. Messages left after 4 pm will be answered the following business day.   You may also send [place] a message via [place]. We typically respond to MyChart messages within 1-2 business days.  For prescription refills, please ask your pharmacy to contact our office. Our fax number is [phone].  If you have an urgent issue when the clinic is closed that cannot wait until the next business day, you can page your doctor at the number below.    Please note that while we do our best to be available for urgent issues outside of office hours, we are not available 24/7.   If you have an urgent issue and are unable to reach [place], you may choose to seek medical care at your doctor's office, retail clinic, urgent care center, or emergency room.  If you have a medical emergency, please immediately call 911 or go to the emergency department.  Pager Numbers  - Dr. Nehemiah Massed: [phone]  - Dr. Laurence Ferrari: [phone]  - Dr. Nicole Kindred: [phone]  In the event of inclement weather, please call our main line at [phone] for an update on the status of any delays or closures.  Dermatology Medication Tips: Please keep the boxes that topical medications come in in order to help keep track of the instructions about where and how to use these. Pharmacies typically print the medication instructions only on the boxes and not directly on the medication tubes.   If your medication is too expensive, please contact our  office at [phone] option 4 or send [place] a message through [place].   We are unable to tell what your co-pay for medications will be in advance as this is different depending on your insurance coverage. However, we may be able to find a substitute medication at lower cost or fill out paperwork to get insurance to cover a needed medication.   If a prior authorization is required to get your medication covered by your insurance company, please allow [place] 1-2 business days to complete this process.  Drug prices often vary depending on where the prescription is filled and some pharmacies may offer cheaper prices.  The website www.goodrx.com contains coupons for medications through different pharmacies. The prices here do not account for what the cost may be with help from insurance (it may be cheaper with your insurance), but the website can give you the price if you did not use any insurance.  - You can print the associated coupon and take it with your prescription to the pharmacy.  - You may also stop by our office during regular business hours and pick up a GoodRx coupon card.  - If you need your prescription sent electronically to a different pharmacy, notify our office through [facility] or by phone at [phone] option 4.

## 2021-01-07 NOTE — Progress Notes (Signed)
   Follow-Up Visit   Subjective  Taylor Hunt is a 70 y.o. female who presents for the following: Follow-up (Patient here today for suture removal at lower lip. ).    The following portions of the chart were reviewed this encounter and updated as appropriate:   Tobacco  Allergies  Meds  Problems  Med Hx  Surg Hx  Fam Hx      Review of Systems:  No other skin or systemic complaints except as noted in HPI or Assessment and Plan.  Objective  Well appearing patient in no apparent distress; mood and affect are within normal limits.  A focused examination was performed including face. Relevant physical exam findings are noted in the Assessment and Plan.    Assessment & Plan   Encounter for Removal of Sutures - Incision site at the mid lower cutaneous lip is clean, dry and intact - Wound cleansed, sutures removed, wound cleansed and steri strips applied.  - Discussed pathology results showing keratin granuloma  - Patient advised to keep steri-strips dry until they fall off. - Scars remodel for a full year. - Once steri-strips fall off, patient can apply over-the-counter silicone scar cream each night to help with scar remodeling if desired. - Patient advised to call with any concerns or if they notice any new or changing lesions.  Return in about 1 week (around 01/14/2021) for Suture Removal.  Graciella Belton, RMA, am acting as scribe for [name], MD .  Documentation: I have reviewed the above documentation for accuracy and completeness, and I agree with the above.  [name], MD

## 2021-01-08 NOTE — Addendum Note (Signed)
Addended by: Alfonso Patten on: 01/08/2021 09:07 PM   Modules accepted: Level of Service

## 2021-01-14 ENCOUNTER — Ambulatory Visit (INDEPENDENT_AMBULATORY_CARE_PROVIDER_SITE_OTHER): Payer: Medicare Other

## 2021-01-14 ENCOUNTER — Other Ambulatory Visit: Payer: Self-pay

## 2021-01-14 DIAGNOSIS — Z4802 Encounter for removal of sutures: Secondary | ICD-10-CM

## 2021-01-14 NOTE — Patient Instructions (Signed)

## 2021-01-14 NOTE — Progress Notes (Signed)
   Follow-Up Visit   Subjective  Taylor Hunt is a 70 y.o. female who presents for the following: Suture / Staple Removal (14 day suture removal for biopsy proven superficial actinic porokeratosis. ).    The following portions of the chart were reviewed this encounter and updated as appropriate:        Objective  Well appearing patient in no apparent distress; mood and affect are within normal limits.    Right Lower Leg - Anterior 14 day suture removal for biopsy proven actinic porokeratosis  Incision site is clean, dry and intact    Assessment & Plan  Encounter for removal of sutures Right Lower Leg - Anterior  Encounter for Removal of Sutures - Incision site at the right posterior calf is clean, dry and intact - Wound cleansed, sutures removed, wound cleansed and steri strips applied.  - Discussed pathology results showing superficial actinic porokeratosis  - Patient advised to keep steri-strips dry until they fall off. - Scars remodel for a full year. - Once steri-strips fall off, patient can apply over-the-counter silicone scar cream each night to help with scar remodeling if desired. - Patient advised to call with any concerns or if they notice any new or changing lesions.    No follow-ups on file.  I, Harriett Sine, CMA, am acting as scribe for [employer].

## 2021-01-15 ENCOUNTER — Other Ambulatory Visit: Payer: Self-pay | Admitting: Dermatology

## 2021-01-15 DIAGNOSIS — B009 Herpesviral infection, unspecified: Secondary | ICD-10-CM

## 2021-01-27 ENCOUNTER — Other Ambulatory Visit: Payer: Self-pay | Admitting: Dermatology

## 2021-01-27 DIAGNOSIS — B009 Herpesviral infection, unspecified: Secondary | ICD-10-CM

## 2021-02-06 ENCOUNTER — Ambulatory Visit: Payer: Medicare Other | Admitting: Dermatology

## 2021-02-28 ENCOUNTER — Other Ambulatory Visit: Payer: Self-pay | Admitting: Internal Medicine

## 2021-02-28 DIAGNOSIS — Z1231 Encounter for screening mammogram for malignant neoplasm of breast: Secondary | ICD-10-CM

## 2021-03-10 ENCOUNTER — Other Ambulatory Visit: Payer: Self-pay

## 2021-03-10 ENCOUNTER — Encounter: Payer: Self-pay | Admitting: Dermatology

## 2021-03-10 DIAGNOSIS — L570 Actinic keratosis: Secondary | ICD-10-CM

## 2021-03-10 DIAGNOSIS — T148XXA Other injury of unspecified body region, initial encounter: Secondary | ICD-10-CM

## 2021-03-10 MED ORDER — FLUOROURACIL 5 % EX CREA: TOPICAL_CREAM | Freq: Every day | CUTANEOUS | 0 refills | Status: DC

## 2021-03-10 MED ORDER — MUPIROCIN 2 % EX OINT: 1.0000 "application " | TOPICAL_OINTMENT | Freq: Every day | CUTANEOUS | 1 refills | Status: DC

## 2021-03-11 ENCOUNTER — Other Ambulatory Visit: Payer: Self-pay

## 2021-03-11 DIAGNOSIS — L570 Actinic keratosis: Secondary | ICD-10-CM

## 2021-03-11 MED ORDER — FLUOROURACIL 5 % EX CREA
TOPICAL_CREAM | Freq: Every day | CUTANEOUS | 0 refills | Status: DC
Start: 2021-03-11 — End: 2022-04-14

## 2021-03-11 NOTE — Progress Notes (Signed)
Rx had not been going through to pharmacy.Script shows it was printed. Resent rx electronically.

## 2021-04-07 ENCOUNTER — Ambulatory Visit
Admission: RE | Admit: 2021-04-07 | Discharge: 2021-04-07 | Disposition: A | Discharge status: Home / Self Care | Payer: Medicare Other | Source: Ambulatory Visit | Attending: Internal Medicine | Admitting: Internal Medicine

## 2021-04-07 ENCOUNTER — Other Ambulatory Visit: Payer: Self-pay

## 2021-04-07 DIAGNOSIS — Z1231 Encounter for screening mammogram for malignant neoplasm of breast: Secondary | ICD-10-CM | POA: Diagnosis present

## 2021-05-07 ENCOUNTER — Other Ambulatory Visit: Payer: Self-pay | Admitting: Internal Medicine

## 2021-05-07 DIAGNOSIS — I471 Supraventricular tachycardia: Secondary | ICD-10-CM

## 2021-05-07 DIAGNOSIS — R918 Other nonspecific abnormal finding of lung field: Secondary | ICD-10-CM

## 2021-05-22 ENCOUNTER — Ambulatory Visit: Payer: Medicare Other | Admitting: Dermatology

## 2021-05-28 ENCOUNTER — Ambulatory Visit (INDEPENDENT_AMBULATORY_CARE_PROVIDER_SITE_OTHER): Payer: Medicare Other | Admitting: Dermatology

## 2021-05-28 ENCOUNTER — Other Ambulatory Visit: Payer: Self-pay

## 2021-05-28 DIAGNOSIS — R234 Changes in skin texture: Secondary | ICD-10-CM

## 2021-05-28 DIAGNOSIS — B009 Herpesviral infection, unspecified: Secondary | ICD-10-CM

## 2021-05-28 DIAGNOSIS — L821 Other seborrheic keratosis: Secondary | ICD-10-CM | POA: Diagnosis not present

## 2021-05-28 DIAGNOSIS — Z85828 Personal history of other malignant neoplasm of skin: Secondary | ICD-10-CM | POA: Diagnosis not present

## 2021-05-28 DIAGNOSIS — L578 Other skin changes due to chronic exposure to nonionizing radiation: Secondary | ICD-10-CM | POA: Diagnosis not present

## 2021-05-28 DIAGNOSIS — L570 Actinic keratosis: Secondary | ICD-10-CM

## 2021-05-28 MED ORDER — VALACYCLOVIR HCL 500 MG PO TABS: ORAL_TABLET | ORAL | 0 refills | Status: DC

## 2021-05-28 NOTE — Progress Notes (Signed)
Follow-Up Visit   Subjective  Taylor Hunt is a 71 y.o. female who presents for the following: Follow-up (Patient here today for AK follow up. Patient used 5FU/calcipotriene to lower lip for 4 days after Christmas. Patient advises she did have peeling at the lip. Patient does have a few scaly spots at nose, a spot at incision site that was red, a fissure at right thumb, scaly spot at right hand. ).  Patient did have shoulder replacement 7 weeks ago.   The following portions of the chart were reviewed this encounter and updated as appropriate:   Tobacco   Allergies   Meds   Problems   Med Hx   Surg Hx   Fam Hx       Review of Systems:  No other skin or systemic complaints except as noted in HPI or Assessment and Plan.  Objective  Well appearing patient in no apparent distress; mood and affect are within normal limits.  A focused examination was performed including hands, face, chest. Relevant physical exam findings are noted in the Assessment and Plan.  Right Thumb Tip Fissure   right dorsal hand x 1 Erythematous thin papules/macules with gritty scale.     Assessment & Plan  Fissure in skin Right Thumb Tip  Secondary to xerosis  Recommend Vaseline and cover with band-aid until resolved  Reviewed gentle skin care   AK (actinic keratosis) right dorsal hand x 1  Prior to procedure, discussed risks of blister formation, small wound, skin dyspigmentation, or rare scar following cryotherapy. Recommend Vaseline ointment to treated areas while healing.  Actinic keratoses are precancerous spots that appear secondary to cumulative UV radiation exposure/sun exposure over time. They are chronic with expected duration over 1 year. A portion of actinic keratoses will progress to squamous cell carcinoma of the skin. It is not possible to reliably predict which spots will progress to skin cancer and so treatment is recommended to prevent development of skin cancer.  Recommend daily broad  spectrum sunscreen SPF 30+ to sun-exposed areas, reapply every 2 hours as needed.  Recommend staying in the shade or wearing long sleeves, sun glasses (UVA+UVB protection) and wide brim hats (4-inch brim around the entire circumference of the hat). Call for new or changing lesions.    Destruction of lesion - right dorsal hand x 1  Destruction method: cryotherapy   Informed consent: discussed and consent obtained   Lesion destroyed using liquid nitrogen: Yes   Cryotherapy cycles:  2 Outcome: patient tolerated procedure well with no complications   Post-procedure details: wound care instructions given    Related Medications fluorouracil (EFUDEX) 5 % cream Apply topically daily. Dispense 15g compounded with calcipotriene. Apply once daily x 4 days to bottom lip. Start taking valacyclovir tablets as directed 24 hours before starting this cream.  Seborrheic Keratoses - Stuck-on, waxy, tan-brown papules and/or plaques  - Benign-appearing - Discussed benign etiology and prognosis. 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 - Severe, chronic, not at goal, secondary to cumulative UV radiation exposure over time - diffuse scaly erythematous macules and papules with underlying dyspigmentation - Discussed Prescription "Field Treatment" for Severe, Chronic Confluent Actinic Changes with Pre-Cancerous Actinic Keratoses Field treatment involves treatment of an entire area of skin that has confluent Actinic Changes (Sun/ Ultraviolet light damage) and PreCancerous Actinic Keratoses by method of  PhotoDynamic Therapy (PDT) and/or prescription Topical Chemotherapy agents such as 5-fluorouracil, 5-fluorouracil/calcipotriene, and/or imiquimod.  The purpose is to decrease the number of clinically evident and subclinical PreCancerous lesions to  prevent progression to development of skin cancer by chemically destroying early precancer changes that may or may not be visible.  It has been shown to reduce the risk of developing skin cancer in the treated area. As a result of treatment, redness, scaling, crusting, and open sores may occur during treatment course. One or more than one of these methods may be used and may have to be used several times to control, suppress and eliminate the PreCancerous changes. Discussed treatment course, expected reaction, and possible side effects. - Recommend daily broad spectrum sunscreen SPF 30+ to sun-exposed areas, reapply every 2 hours as needed.  - Staying in the shade or wearing long sleeves, sun glasses (UVA+UVB protection) and wide brim hats (4-inch brim around the entire circumference of the hat) are also recommended. - Call for new or changing lesions. - Restart rx 5FU/calcipotriene twice a day for 4 days to nose and right upper cutaneous lip - May repeat 5FU/calcipotriene daily x 4 days if lower lip still scaly in 1 month. Restart valacyclovir as directed prior treatment of lip with 5FU/calcipotriene  History of Basal Cell Carcinoma of the Skin - No evidence of recurrence today at nasal tip s/p Mohs surgery - Recommend regular full body skin exams - Recommend daily broad spectrum sunscreen SPF 30+ to sun-exposed areas, reapply every 2 hours as needed.  - Call if any new or changing lesions are noted between office visits  Return in about 6 months (around 11/25/2021) for TBSE, AK follow up.  Graciella Belton, RMA, am acting as scribe for [name], MD .  Documentation: I have reviewed the above documentation for accuracy and completeness, and I agree with the above.  [name], MD

## 2021-05-28 NOTE — Patient Instructions (Signed)
Restart 5FU/calcipotriene twice a day for 4 days to nose and right upper cutaneous lip May repeat 5FU/calcipotriene if lower lip still scaly in 1 month. Restart valacyclovir prior.   Cryotherapy Aftercare  Wash gently with soap and water everyday.   Apply Vaseline and Band-Aid daily until healed.   Prior to procedure, discussed risks of blister formation, small wound, skin dyspigmentation, or rare scar following cryotherapy. Recommend Vaseline ointment to treated areas while healing.  If You Need Anything After Your Visit  If you have any questions or concerns for your doctor, please call our main line at [phone] and press option 4 to reach your doctor's medical assistant. If no one answers, please leave a voicemail as directed and we will return your call as soon as possible. Messages left after 4 pm will be answered the following business day.   You may also send [place] a message via [place]. We typically respond to MyChart messages within 1-2 business days.  For prescription refills, please ask your pharmacy to contact our office. Our fax number is [phone].  If you have an urgent issue when the clinic is closed that cannot wait until the next business day, you can page your doctor at the number below.    Please note that while we do our best to be available for urgent issues outside of office hours, we are not available 24/7.   If you have an urgent issue and are unable to reach [place], you may choose to seek medical care at your doctor's office, retail clinic, urgent care center, or emergency room.  If you have a medical emergency, please immediately call 911 or go to the emergency department.  Pager Numbers  - Dr. Nehemiah Massed: [phone]  - Dr. Laurence Ferrari: [phone]  - Dr. Nicole Kindred: [phone]  In the event of inclement weather, please call our main line at [phone] for an update on the status of any delays or closures.  Dermatology Medication Tips: Please keep the boxes  that topical medications come in in order to help keep track of the instructions about where and how to use these. Pharmacies typically print the medication instructions only on the boxes and not directly on the medication tubes.   If your medication is too expensive, please contact our office at [phone] option 4 or send [place] a message through [place].   We are unable to tell what your co-pay for medications will be in advance as this is different depending on your insurance coverage. However, we may be able to find a substitute medication at lower cost or fill out paperwork to get insurance to cover a needed medication.   If a prior authorization is required to get your medication covered by your insurance company, please allow [place] 1-2 business days to complete this process.  Drug prices often vary depending on where the prescription is filled and some pharmacies may offer cheaper prices.  The website www.goodrx.com contains coupons for medications through different pharmacies. The prices here do not account for what the cost may be with help from insurance (it may be cheaper with your insurance), but the website can give you the price if you did not use any insurance.  - You can print the associated coupon and take it with your prescription to the pharmacy.  - You may also stop by our office during regular business hours and pick up a GoodRx coupon card.  - If you need your prescription sent electronically to a different pharmacy, notify our office through  [place] or by phone at [phone] option 4.     Si Usted Necesita Algo Despus de Su Visita  Tambin puede enviarnos un mensaje a travs de Pharmacist, community. Por lo general respondemos a los mensajes de MyChart en el transcurso de 1 a 2 das hbiles.  Para renovar recetas, por favor pida a su farmacia que se ponga en contacto con nuestra oficina. Harland Dingwall de fax es [place] [phone].  Si tiene un asunto urgente cuando la  clnica est cerrada y que no puede esperar hasta el siguiente da hbil, puede llamar/localizar a su doctor(a) al nmero que aparece a continuacin.   Por favor, tenga en cuenta que aunque hacemos todo lo posible para estar disponibles para asuntos urgentes fuera del horario de [place], no estamos disponibles las 24 horas del da, los 7 das de la [employer].   Si tiene un problema urgente y no puede comunicarse con nosotros, puede optar por buscar atencin mdica  en el consultorio de su doctor(a), en una clnica privada, en un centro de atencin urgente o en una sala de emergencias.  Si tiene Engineering geologist, por favor llame inmediatamente al 911 o vaya a la sala de emergencias.  Nmeros de bper  - Dr. Nehemiah Massed: [phone]  - Dra. Moye: [phone]  - Dra. Nicole Kindred: [phone]  En caso de inclemencias del [place], por favor llame a Johnsie Kindred principal al [phone] para una actualizacin sobre el [place] de cualquier retraso o cierre.  Consejos para la medicacin en dermatologa: Por favor, guarde las cajas en las que vienen los medicamentos de uso tpico para ayudarle a seguir las instrucciones sobre dnde y cmo usarlos. Las farmacias generalmente imprimen las instrucciones del medicamento slo en las cajas y no directamente en los tubos del [place].   Si su medicamento es muy caro, por favor, pngase en contacto con Zigmund Daniel llamando al [phone] y presione la opcin 4 o envenos un mensaje a travs de Pharmacist, community.   No podemos decirle cul ser su copago por los medicamentos por adelantado ya que esto es diferente dependiendo de la cobertura de su seguro. Sin embargo, es posible que podamos encontrar un medicamento sustituto a Electrical engineer un formulario para que el seguro cubra el medicamento que se considera necesario.   Si se requiere una autorizacin previa para que su compaa de seguros [place] su medicamento, por favor permtanos de 1 a 2 das hbiles  para completar este proceso.  Los precios de los medicamentos varan con frecuencia dependiendo del Environmental consultant de dnde se surte la receta y alguna farmacias pueden ofrecer precios ms baratos.  El sitio web www.goodrx.com tiene cupones para medicamentos de Airline pilot. Los precios aqu no tienen en cuenta lo que podra costar con la ayuda del seguro (puede ser ms barato con su seguro), pero el sitio web puede darle el precio si no utiliz Research scientist (physical sciences).  - Puede imprimir el cupn correspondiente y llevarlo con su receta a la farmacia.  - Tambin puede pasar por nuestra oficina durante el horario de atencin regular y Charity fundraiser una tarjeta de cupones de GoodRx.  - Si necesita que su receta se enve electrnicamente a una farmacia diferente, informe a nuestra oficina a travs de MyChart de [place] o por telfono llamando al [phone] y presione la opcin 4.

## 2021-05-29 ENCOUNTER — Ambulatory Visit
Admission: RE | Admit: 2021-05-29 | Discharge: 2021-05-29 | Disposition: A | Discharge status: Home / Self Care | Payer: Medicare Other | Source: Ambulatory Visit | Attending: Internal Medicine | Admitting: Internal Medicine

## 2021-05-29 DIAGNOSIS — R918 Other nonspecific abnormal finding of lung field: Secondary | ICD-10-CM | POA: Insufficient documentation

## 2021-05-29 DIAGNOSIS — I471 Supraventricular tachycardia: Secondary | ICD-10-CM | POA: Diagnosis present

## 2021-05-29 MED ORDER — IOHEXOL 300 MG/ML  SOLN
75.0000 mL | Freq: Once | INTRAMUSCULAR | Status: AC | PRN
  Administered 2021-05-29: 75 mL via INTRAVENOUS

## 2021-06-09 ENCOUNTER — Encounter: Payer: Self-pay | Admitting: Dermatology

## 2021-08-04 IMAGING — CT CT ABD-PELV W/O CM
2 of 4 series · 16 of 46 positions shown, 18 images · non-contrast
Comparison: 07/21/2017, 09/26/2018

CLINICAL DATA: Anemia, fatigue, bloating

EXAM:
CT ABDOMEN AND PELVIS WITHOUT CONTRAST
TECHNIQUE: Multidetector CT imaging of the abdomen and pelvis was performed
following the standard protocol without IV contrast.

[Series 2: axials routine abdomen pelvis without 5.00 · axial · non-contrast · 0.63mm/px · z∈[-1469,-1099]mm · 13 of 82 slices shown, 15 images]
[im 4/82  soft-tissue]
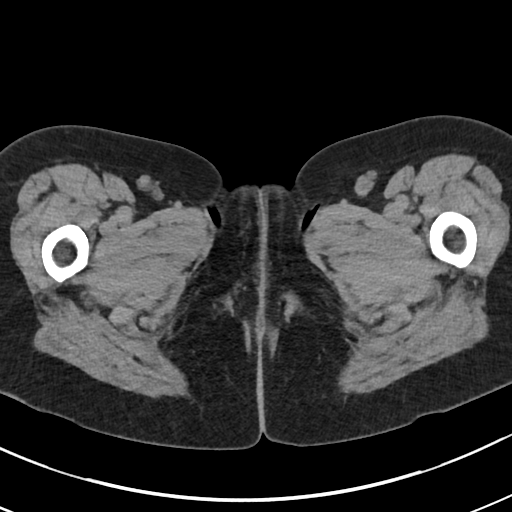
[im 4/82  bone]
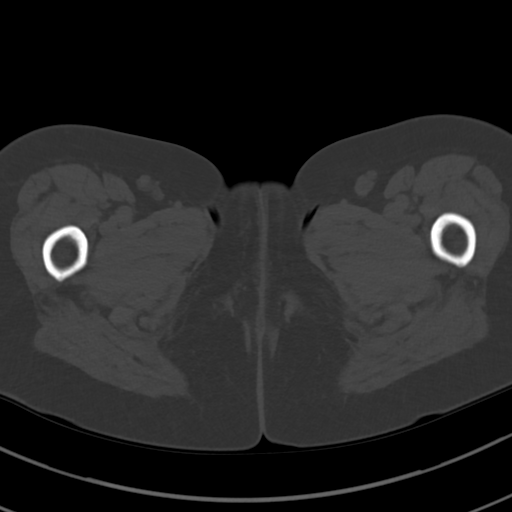
[im 10/82  soft-tissue]
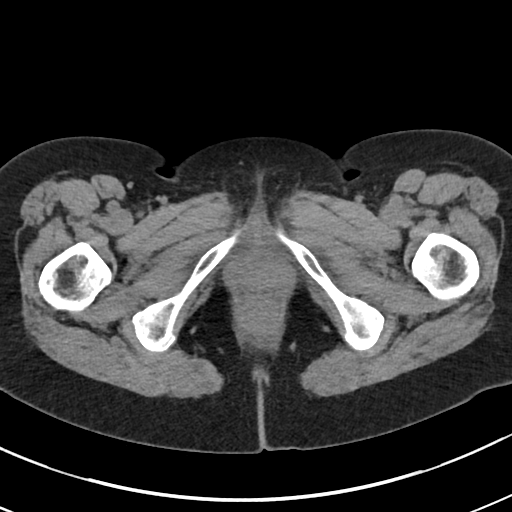
[im 17/82  soft-tissue]
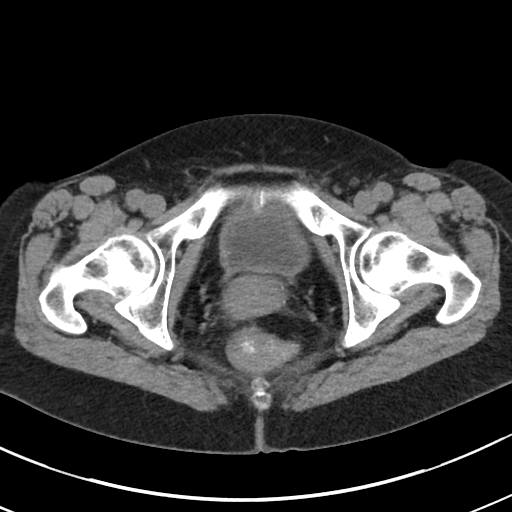
[im 23/82  soft-tissue]
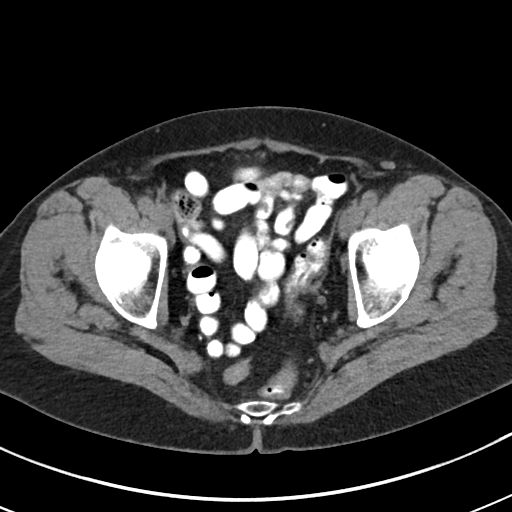
[im 30/82  soft-tissue]
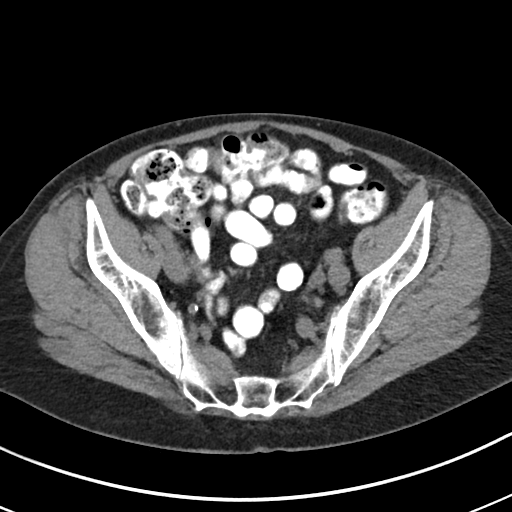
[im 36/82  soft-tissue]
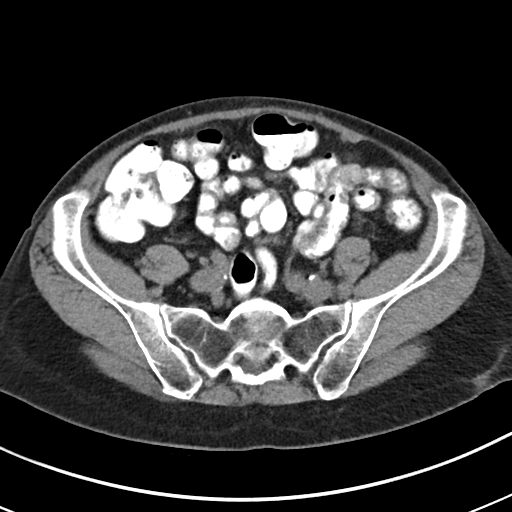
[im 43/82  soft-tissue]
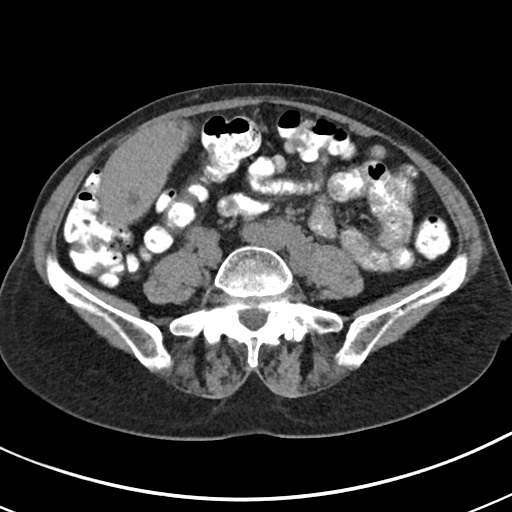
[im 46/82  soft-tissue]
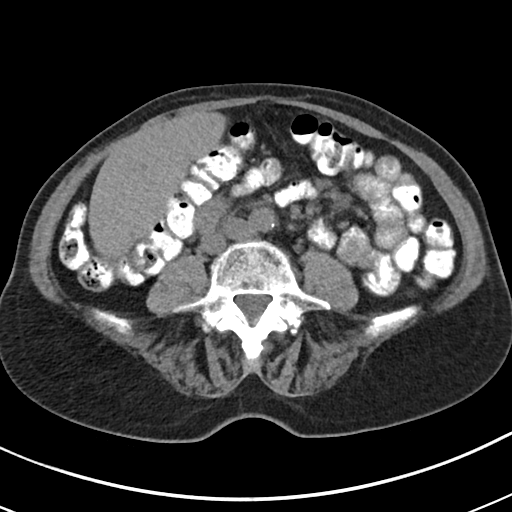
[im 52/82  soft-tissue]
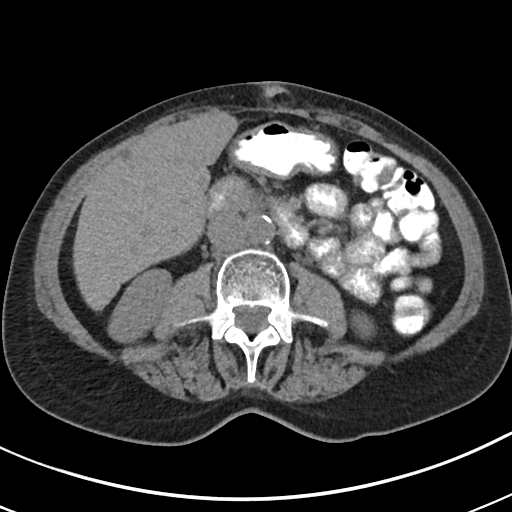
[im 52/82  bone]
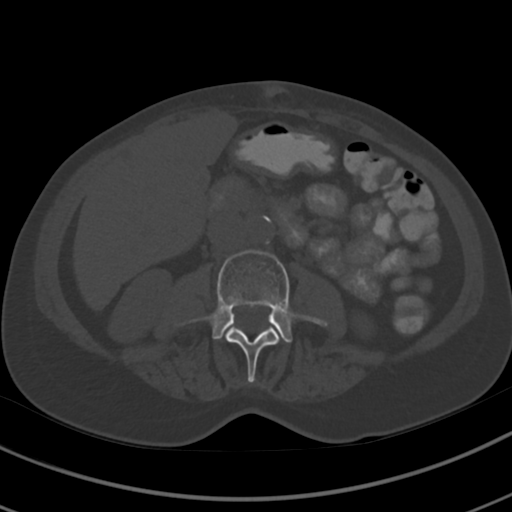
[im 59/82  soft-tissue]
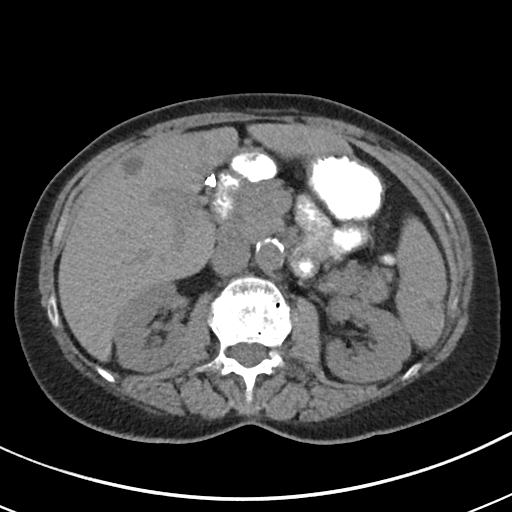
[im 65/82  soft-tissue]
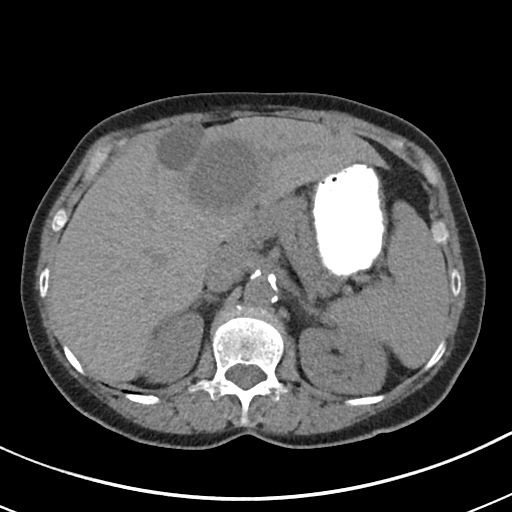
[im 72/82  soft-tissue]
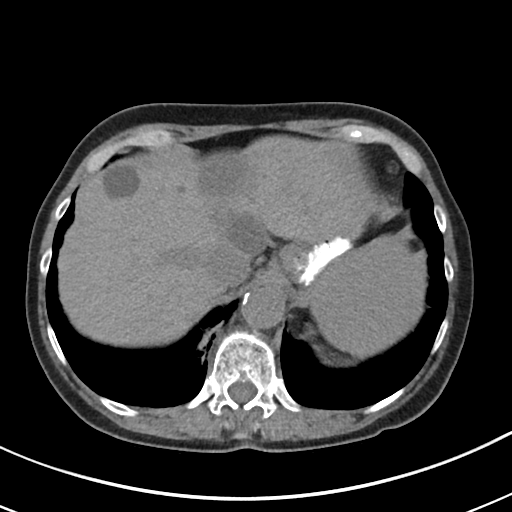
[im 78/82  soft-tissue]
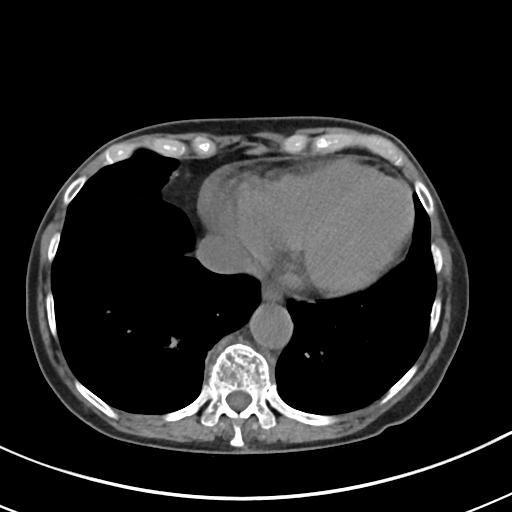

[Series 4: coronals routine abdomen pelvis without 2.00 cor · coronal · non-contrast · 0.63mm/px · 3 of 122 slices shown]
[im 41/122  soft-tissue]
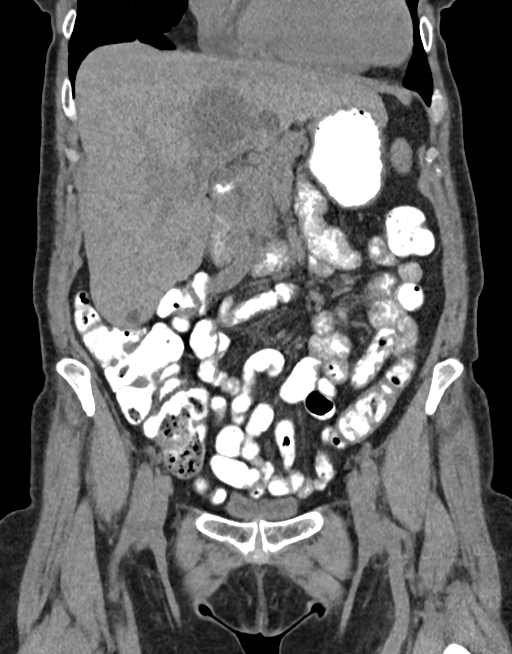
[im 54/122  soft-tissue]
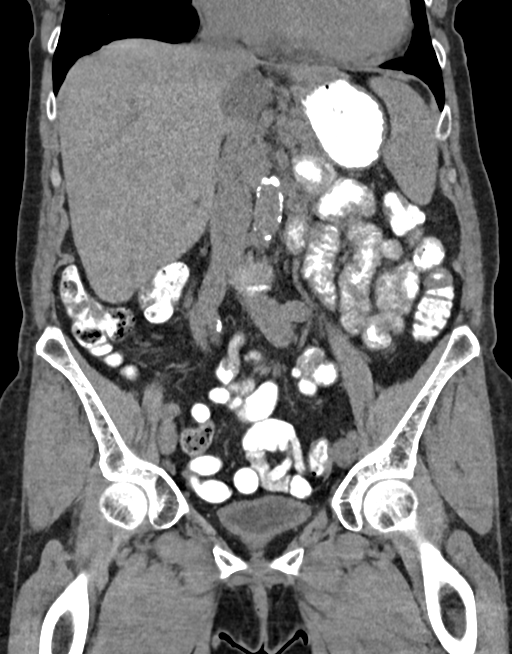
[im 68/122  soft-tissue]
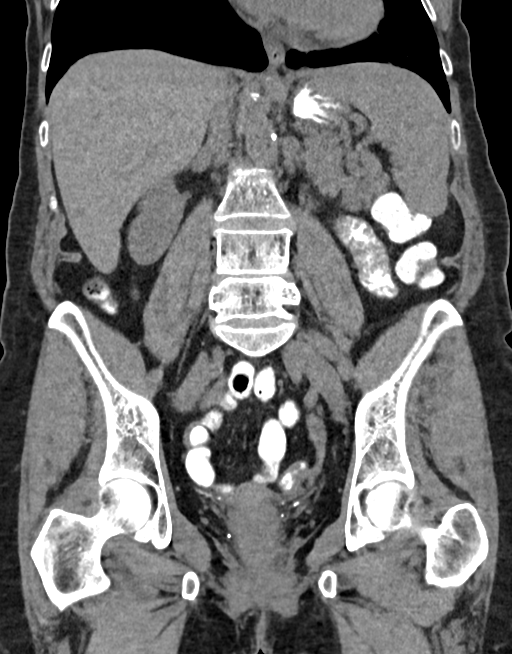

[16 of 46 positions shown; findings below may reference images not displayed]

FINDINGS: Lower chest: No acute pleural or parenchymal lung disease.

Unenhanced CT was performed per clinician order. Lack of IV contrast
limits sensitivity and specificity, especially for evaluation of
abdominal/pelvic solid viscera.

Hepatobiliary: Numerous hepatic hypodensities are again identified,
previously evaluated by ultrasound and consistent with multiple
hepatic cysts.

The gallbladder is surgically absent.  No biliary dilation.

Pancreas: Unremarkable. No pancreatic ductal dilatation or
surrounding inflammatory changes.

Spleen: Normal in size without focal abnormality.

Adrenals/Urinary Tract: No urinary tract calculi or obstructive
uropathy within either kidney. Adrenals are grossly normal. The
bladder is decompressed, limiting its evaluation.

Stomach/Bowel: No bowel obstruction or ileus. No bowel wall
thickening or inflammatory change.

Vascular/Lymphatic: Aortic atherosclerosis. No enlarged abdominal or
pelvic lymph nodes.

Reproductive: Uterus and bilateral adnexa are unremarkable.

Other: No free fluid or free gas. Small fat containing umbilical
hernia unchanged. No bowel herniation.

Musculoskeletal: No acute or destructive bony lesions. Continued
retrolisthesis of L2 relative to L3, with marked progressive
spondylosis since prior exam. Reconstructed images demonstrate no
additional findings.
IMPRESSION: 1. No acute intra-abdominal or intrapelvic process.
2. Numerous hepatic cysts.
3. Small fat containing umbilical hernia.
4. Progressive spondylosis at L2-3.  No acute fractures.
5.  Aortic Atherosclerosis ([id number]).

## 2021-11-10 ENCOUNTER — Other Ambulatory Visit: Payer: Self-pay | Admitting: Internal Medicine

## 2021-11-10 DIAGNOSIS — R918 Other nonspecific abnormal finding of lung field: Secondary | ICD-10-CM

## 2021-11-26 ENCOUNTER — Encounter: Payer: Self-pay | Admitting: Dermatology

## 2021-11-26 ENCOUNTER — Ambulatory Visit (INDEPENDENT_AMBULATORY_CARE_PROVIDER_SITE_OTHER): Payer: Medicare Other | Admitting: Dermatology

## 2021-11-26 DIAGNOSIS — D229 Melanocytic nevi, unspecified: Secondary | ICD-10-CM

## 2021-11-26 DIAGNOSIS — D22122 Melanocytic nevi of left lower eyelid, including canthus: Secondary | ICD-10-CM | POA: Diagnosis not present

## 2021-11-26 DIAGNOSIS — L570 Actinic keratosis: Secondary | ICD-10-CM | POA: Diagnosis not present

## 2021-11-26 DIAGNOSIS — C44719 Basal cell carcinoma of skin of left lower limb, including hip: Secondary | ICD-10-CM

## 2021-11-26 DIAGNOSIS — Z86018 Personal history of other benign neoplasm: Secondary | ICD-10-CM

## 2021-11-26 DIAGNOSIS — Z1283 Encounter for screening for malignant neoplasm of skin: Secondary | ICD-10-CM

## 2021-11-26 DIAGNOSIS — L720 Epidermal cyst: Secondary | ICD-10-CM

## 2021-11-26 DIAGNOSIS — L578 Other skin changes due to chronic exposure to nonionizing radiation: Secondary | ICD-10-CM

## 2021-11-26 DIAGNOSIS — L821 Other seborrheic keratosis: Secondary | ICD-10-CM

## 2021-11-26 DIAGNOSIS — B351 Tinea unguium: Secondary | ICD-10-CM

## 2021-11-26 DIAGNOSIS — L814 Other melanin hyperpigmentation: Secondary | ICD-10-CM

## 2021-11-26 DIAGNOSIS — C4491 Basal cell carcinoma of skin, unspecified: Secondary | ICD-10-CM

## 2021-11-26 DIAGNOSIS — D492 Neoplasm of unspecified behavior of bone, soft tissue, and skin: Secondary | ICD-10-CM

## 2021-11-26 DIAGNOSIS — D1800 Hemangioma unspecified site: Secondary | ICD-10-CM

## 2021-11-26 DIAGNOSIS — Z85828 Personal history of other malignant neoplasm of skin: Secondary | ICD-10-CM

## 2021-11-26 HISTORY — DX: Basal cell carcinoma of skin, unspecified: C44.91

## 2021-11-26 NOTE — Patient Instructions (Addendum)
Cryotherapy Aftercare  Wash gently with soap and water everyday.   Apply Vaseline daily until healed.   Plan Fluorouracil/Calcipotriene to Lips once daily for 4 days, rest of face twice daily for 4 days. Begin treatment pending pathology results.   Wound Care Instructions  Cleanse wound gently with soap and water once a day then pat dry with clean gauze. Apply a thing coat of Petrolatum (petroleum jelly, "Vaseline") over the wound (unless you have an allergy to this). We recommend that you use a new, sterile tube of Vaseline. Do not pick or remove scabs. Do not remove the yellow or white "healing tissue" from the base of the wound.  Cover the wound with fresh, clean, nonstick gauze and secure with paper tape. You may use Band-Aids in place of gauze and tape if the would is small enough, but would recommend trimming much of the tape off as there is often too much. Sometimes Band-Aids can irritate the skin.  You should call the office for your biopsy report after 1 week if you have not already been contacted.  If you experience any problems, such as abnormal amounts of bleeding, swelling, significant bruising, significant pain, or evidence of infection, please call the office immediately.  FOR ADULT SURGERY PATIENTS: If you need something for pain relief you may take 1 extra strength Tylenol (acetaminophen) AND 2 Ibuprofen ('200mg'$  each) together every 4 hours as needed for pain. (do not take these if you are allergic to them or if you have a reason you should not take them.) Typically, you may only need pain medication for 1 to 3 days.     Recommend daily broad spectrum sunscreen SPF 30+ to sun-exposed areas, reapply every 2 hours as needed. Call for new or changing lesions.  Staying in the shade or wearing long sleeves, sun glasses (UVA+UVB protection) and wide brim hats (4-inch brim around the entire circumference of the hat) are also recommended for sun protection.    Melanoma  ABCDEs  Melanoma is the most dangerous type of skin cancer, and is the leading cause of death from skin disease.  You are more likely to develop melanoma if you: Have light-colored skin, light-colored eyes, or red or blond hair Spend a lot of time in the sun Tan regularly, either outdoors or in a tanning bed Have had blistering sunburns, especially during childhood Have a close family member who has had a melanoma Have atypical moles or large birthmarks  Early detection of melanoma is key since treatment is typically straightforward and cure rates are extremely high if we catch it early.   The first sign of melanoma is often a change in a mole or a new dark spot.  The ABCDE system is a way of remembering the signs of melanoma.  A for asymmetry:  The two halves do not match. B for border:  The edges of the growth are irregular. C for color:  A mixture of colors are present instead of an even brown color. D for diameter:  Melanomas are usually (but not always) greater than 77m - the size of a pencil eraser. E for evolution:  The spot keeps changing in size, shape, and color.  Please check your skin once per month between visits. You can use a small mirror in front and a large mirror behind you to keep an eye on the back side or your body.   If you see any new or changing lesions before your next follow-up, please call to schedule  a visit.  Please continue daily skin protection including broad spectrum sunscreen SPF 30+ to sun-exposed areas, reapplying every 2 hours as needed when you're outdoors.   Staying in the shade or wearing long sleeves, sun glasses (UVA+UVB protection) and wide brim hats (4-inch brim around the entire circumference of the hat) are also recommended for sun protection.     Due to recent changes in healthcare laws, you may see results of your pathology and/or laboratory studies on MyChart before the doctors have had a chance to review them. We understand that in some  cases there may be results that are confusing or concerning to you. Please understand that not all results are received at the same time and often the doctors may need to interpret multiple results in order to provide you with the best plan of care or course of treatment. Therefore, we ask that you please give [place] 2 business days to thoroughly review all your results before contacting the office for clarification. Should we see a critical lab result, you will be contacted sooner.   If You Need Anything After Your Visit  If you have any questions or concerns for your doctor, please call our main line at [phone] and press option 4 to reach your doctor's medical assistant. If no one answers, please leave a voicemail as directed and we will return your call as soon as possible. Messages left after 4 pm will be answered the following business day.   You may also send [place] a message via [place]. We typically respond to MyChart messages within 1-2 business days.  For prescription refills, please ask your pharmacy to contact our office. Our fax number is [phone].  If you have an urgent issue when the clinic is closed that cannot wait until the next business day, you can page your doctor at the number below.    Please note that while we do our best to be available for urgent issues outside of office hours, we are not available 24/7.   If you have an urgent issue and are unable to reach [place], you may choose to seek medical care at your doctor's office, retail clinic, urgent care center, or emergency room.  If you have a medical emergency, please immediately call 911 or go to the emergency department.  Pager Numbers  - Dr. Nehemiah Massed: [phone]  - Dr. Laurence Ferrari: [phone]  - Dr. Nicole Kindred: [phone]  In the event of inclement weather, please call our main line at [phone] for an update on the status of any delays or closures.  Dermatology Medication Tips: Please keep the boxes that  topical medications come in in order to help keep track of the instructions about where and how to use these. Pharmacies typically print the medication instructions only on the boxes and not directly on the medication tubes.   If your medication is too expensive, please contact our office at [phone] option 4 or send [place] a message through [place].   We are unable to tell what your co-pay for medications will be in advance as this is different depending on your insurance coverage. However, we may be able to find a substitute medication at lower cost or fill out paperwork to get insurance to cover a needed medication.   If a prior authorization is required to get your medication covered by your insurance company, please allow [place] 1-2 business days to complete this process.  Drug prices often vary depending on where the prescription is filled and some pharmacies may  offer cheaper prices.  The website www.goodrx.com contains coupons for medications through different pharmacies. The prices here do not account for what the cost may be with help from insurance (it may be cheaper with your insurance), but the website can give you the price if you did not use any insurance.  - You can print the associated coupon and take it with your prescription to the pharmacy.  - You may also stop by our office during regular business hours and pick up a GoodRx coupon card.  - If you need your prescription sent electronically to a different pharmacy, notify our office through [facility] or by phone at [phone] option 4.     Si Usted Necesita Algo Despus de Su Visita  Tambin puede enviarnos un mensaje a travs de Pharmacist, community. Por lo general respondemos a los mensajes de MyChart en el transcurso de 1 a 2 das hbiles.  Para renovar recetas, por favor pida a su farmacia que se ponga en contacto con nuestra oficina. Harland Dingwall de fax es [place] [phone].  Si tiene un asunto urgente cuando la clnica  est cerrada y que no puede esperar hasta el siguiente da hbil, puede llamar/localizar a su doctor(a) al nmero que aparece a continuacin.   Por favor, tenga en cuenta que aunque hacemos todo lo posible para estar disponibles para asuntos urgentes fuera del horario de [employer], no estamos disponibles las 24 horas del da, los 7 das de la [place].   Si tiene un problema urgente y no puede comunicarse con nosotros, puede optar por buscar atencin mdica  en el consultorio de su doctor(a), en una clnica privada, en un centro de atencin urgente o en una sala de emergencias.  Si tiene Engineering geologist, por favor llame inmediatamente al 911 o vaya a la sala de emergencias.  Nmeros de bper  - Dr. Nehemiah Massed: [phone]  - Dra. Moye: [phone]  - Dra. Nicole Kindred: [phone]  En caso de inclemencias del [place], por favor llame a Johnsie Kindred principal al [phone] para una actualizacin sobre el [place] de cualquier retraso o cierre.  Consejos para la medicacin en dermatologa: Por favor, guarde las cajas en las que vienen los medicamentos de uso tpico para ayudarle a seguir las instrucciones sobre dnde y cmo usarlos. Las farmacias generalmente imprimen las instrucciones del medicamento slo en las cajas y no directamente en los tubos del [place].   Si su medicamento es muy caro, por favor, pngase en contacto con Zigmund Daniel llamando al [phone] y presione la opcin 4 o envenos un mensaje a travs de Pharmacist, community.   No podemos decirle cul ser su copago por los medicamentos por adelantado ya que esto es diferente dependiendo de la cobertura de su seguro. Sin embargo, es posible que podamos encontrar un medicamento sustituto a Electrical engineer un formulario para que el seguro cubra el medicamento que se considera necesario.   Si se requiere una autorizacin previa para que su compaa de seguros [place] su medicamento, por favor permtanos de 1 a 2 das hbiles para  completar este proceso.  Los precios de los medicamentos varan con frecuencia dependiendo del Environmental consultant de dnde se surte la receta y alguna farmacias pueden ofrecer precios ms baratos.  El sitio web www.goodrx.com tiene cupones para medicamentos de Airline pilot. Los precios aqu no tienen en cuenta lo que podra costar con la ayuda del seguro (puede ser ms barato con su seguro), pero el sitio web puede darle el precio si no Copy  ningn seguro.  - Puede imprimir el cupn correspondiente y llevarlo con su receta a la farmacia.  - Tambin puede pasar por nuestra oficina durante el horario de atencin regular y Charity fundraiser una tarjeta de cupones de GoodRx.  - Si necesita que su receta se enve electrnicamente a una farmacia diferente, informe a nuestra oficina a travs de MyChart de [place] o por telfono llamando al [phone] y presione la opcin 4.

## 2021-11-26 NOTE — Progress Notes (Signed)
Follow-Up Visit   Subjective  Taylor Hunt is a 71 y.o. female who presents for the following: Annual Exam (Here for skin cancer screening. Hx of BCC's, SCC's, dysplastic nevi. Hx of AK's. Has used 5FU/Calcipotriene on nose and right upper lip since last visit).  The patient presents for Total-Body Skin Exam (TBSE) for skin cancer screening and mole check.  The patient has spots, moles and lesions to be evaluated, some may be new or changing and the patient has concerns that these could be cancer.   The following portions of the chart were reviewed this encounter and updated as appropriate:  Tobacco  Allergies  Meds  Problems  Med Hx  Surg Hx  Fam Hx      Review of Systems: No other skin or systemic complaints except as noted in HPI or Assessment and Plan.   Objective  Well appearing patient in no apparent distress; mood and affect are within normal limits.  A full examination was performed including scalp, head, eyes, ears, nose, lips, neck, chest, axillae, abdomen, back, buttocks, bilateral upper extremities, bilateral lower extremities, hands, feet, fingers, toes, fingernails, and toenails. All findings within normal limits unless otherwise noted below.  Left Lateral Lower Eyelid 0.3 cm tan thin papule with pigment globule     Left Lateral pretibia 0.5 cm pink papule     Right Dorsal Hand Smooth white papule(s).   Right pretibia (hypertrophic) x1, left dorsal hand x1, right neck x1, left calf x1, right popliteal fossa x1 (5) Erythematous thin papules/macules with gritty scale.   toenails Yellowish discoloration with thickening and subungual debris  right neck Subcutaneous nodule.    Assessment & Plan   History of Basal Cell Carcinoma of the Skin. Right nasal tip. Mohs 01/09/2021. - No evidence of recurrence today - Recommend regular full body skin exams - Recommend daily broad spectrum sunscreen SPF 30+ to sun-exposed areas, reapply every 2 hours as needed.   - Call if any new or changing lesions are noted between office visits   History of Squamous Cell Carcinoma of the Skin. Multiple sites, see history. - No evidence of recurrence today - No lymphadenopathy - Recommend regular full body skin exams - Recommend daily broad spectrum sunscreen SPF 30+ to sun-exposed areas, reapply every 2 hours as needed.  - Call if any new or changing lesions are noted between office visits  History of Dysplastic Nevi. See history. - No evidence of recurrence today - Recommend regular full body skin exams - Recommend daily broad spectrum sunscreen SPF 30+ to sun-exposed areas, reapply every 2 hours as needed.  - Call if any new or changing lesions are noted between office visits   Lentigines - Scattered tan macules - Due to sun exposure - Benign-appearing, observe - Recommend daily broad spectrum sunscreen SPF 30+ to sun-exposed areas, reapply every 2 hours as needed. - Call for any changes  Seborrheic Keratoses - Stuck-on, waxy, tan-brown papules and/or plaques  - Benign-appearing - Discussed benign etiology and prognosis. - Observe - Call for any changes  Melanocytic Nevi - Tan-brown and/or pink-flesh-colored symmetric macules and papules - Benign appearing on exam today - Observation - Call clinic for new or changing moles - Recommend daily use of broad spectrum spf 30+ sunscreen to sun-exposed areas.   Hemangiomas - Red papules - Discussed benign nature - Observe - Call for any changes  Actinic Damage - Severe, confluent actinic changes with pre-cancerous actinic keratoses  - Severe, chronic, not at goal, secondary  to cumulative UV radiation exposure over time - diffuse scaly erythematous macules and papules with underlying dyspigmentation - Discussed Prescription "Field Treatment" for Severe, Chronic Confluent Actinic Changes with Pre-Cancerous Actinic Keratoses Field treatment involves treatment of an entire area of skin that has  confluent Actinic Changes (Sun/ Ultraviolet light damage) and PreCancerous Actinic Keratoses by method of PhotoDynamic Therapy (PDT) and/or prescription Topical Chemotherapy agents such as 5-fluorouracil, 5-fluorouracil/calcipotriene, and/or imiquimod.  The purpose is to decrease the number of clinically evident and subclinical PreCancerous lesions to prevent progression to development of skin cancer by chemically destroying early precancer changes that may or may not be visible.  It has been shown to reduce the risk of developing skin cancer in the treated area. As a result of treatment, redness, scaling, crusting, and open sores may occur during treatment course. One or more than one of these methods may be used and may have to be used several times to control, suppress and eliminate the PreCancerous changes. Discussed treatment course, expected reaction, and possible side effects. - Recommend daily broad spectrum sunscreen SPF 30+ to sun-exposed areas, reapply every 2 hours as needed.  - Staying in the shade or wearing long sleeves, sun glasses (UVA+UVB protection) and wide brim hats (4-inch brim around the entire circumference of the hat) are also recommended. - Call for new or changing lesions.  5FU/CalcipotrieneLips once daily for 4 days, rest of face twice daily for 4 days. Begin treatment pending pathology results.    Skin cancer screening performed today.  Neoplasm of skin (2) Left Lateral Lower Eyelid  Skin / nail biopsy Type of biopsy: tangential   Informed consent: discussed and consent obtained   Anesthesia: the lesion was anesthetized in a standard fashion   Anesthesia comment:  Area prepped with alcohol Anesthetic:  1% lidocaine w/ epinephrine 1-100,000 buffered w/ 8.4% NaHCO3 Instrument used: flexible razor blade   Hemostasis achieved with: pressure, aluminum chloride and electrodesiccation   Outcome: patient tolerated procedure well   Post-procedure details: wound care  instructions given   Post-procedure details comment:  Ointment and small bandage applied  Specimen 1 - Surgical pathology Differential Diagnosis: R/O BCC vs pigmented AK > atypia  Check Margins: No  Left Lateral pretibia  Skin / nail biopsy Type of biopsy: tangential   Informed consent: discussed and consent obtained   Anesthesia: the lesion was anesthetized in a standard fashion   Anesthesia comment:  Area prepped with alcohol Anesthetic:  1% lidocaine w/ epinephrine 1-100,000 buffered w/ 8.4% NaHCO3 Instrument used: flexible razor blade   Hemostasis achieved with: pressure, aluminum chloride and electrodesiccation   Outcome: patient tolerated procedure well   Post-procedure details: wound care instructions given   Post-procedure details comment:  Ointment and small bandage applied  Specimen 2 - Surgical pathology Differential Diagnosis: R/O BCC  Check Margins: No  [place] Right Dorsal Hand  Benign-appearing, observe.  Call for changes.  AK (actinic keratosis) (5) Right pretibia (hypertrophic) x1, left dorsal hand x1, right neck x1, left calf x1, right popliteal fossa x1  Favor AK at right neck, Bx if not resolved after LN2.  Actinic keratoses are precancerous spots that appear secondary to cumulative UV radiation exposure/sun exposure over time. They are chronic with expected duration over 1 year. A portion of actinic keratoses will progress to squamous cell carcinoma of the skin. It is not possible to reliably predict which spots will progress to skin cancer and so treatment is recommended to prevent development of skin cancer.  Recommend daily  broad spectrum sunscreen SPF 30+ to sun-exposed areas, reapply every 2 hours as needed.  Recommend staying in the shade or wearing long sleeves, sun glasses (UVA+UVB protection) and wide brim hats (4-inch brim around the entire circumference of the hat). Call for new or changing lesions.  Destruction of lesion - Right pretibia  (hypertrophic) x1, left dorsal hand x1, right neck x1, left calf x1, right popliteal fossa x1  Destruction method: cryotherapy   Informed consent: discussed and consent obtained   Lesion destroyed using liquid nitrogen: Yes   Outcome: patient tolerated procedure well with no complications   Post-procedure details: wound care instructions given   Additional details:  Prior to procedure, discussed risks of blister formation, small wound, skin dyspigmentation, or rare scar following cryotherapy. Recommend Vaseline ointment to treated areas while healing.   Related Medications fluorouracil (EFUDEX) 5 % cream Apply topically daily. Dispense 15g compounded with calcipotriene. Apply once daily x 4 days to bottom lip. Start taking valacyclovir tablets as directed 24 hours before starting this cream.  Onychomycosis toenails  Discussed topical vs oral treatment.   Patient deferred treatment at this time.   Epidermal inclusion cyst right neck  Benign-appearing. Exam most consistent with an epidermal inclusion cyst. Discussed that a cyst is a benign growth that can grow over time and sometimes get irritated or inflamed. Recommend observation if it is not bothersome. Discussed option of surgical excision to remove it if it is growing, symptomatic, or other changes noted. Please call for new or changing lesions so they can be evaluated.     Return for AK Follow Up in 3-4 months, TBSE in 6 months.  I, Emelia Salisbury, CMA, am acting as scribe for [name], MD.  Documentation: I have reviewed the above documentation for accuracy and completeness, and I agree with the above.  [name], MD

## 2021-11-27 ENCOUNTER — Telehealth: Payer: Self-pay

## 2021-11-27 NOTE — Telephone Encounter (Signed)
Patient advised bx results showed benign nevus at left lateral lower eyelid and BCC at left lateral pretibia. Patient given the option of EDC vs Mohs to treat. Patient scheduled for [facility] 12/24/21. Lurlean Horns., RMA

## 2021-11-27 NOTE — Telephone Encounter (Signed)
-----   Message from Alfonso Patten, MD sent at 11/27/2021  4:39 PM EDT ----- 1. Skin , left lateral lower eyelid MELANOCYTIC NEVUS, INTRADERMAL TYPE, BASE INVOLVED   This is a NORMAL MOLE. No additional treatment is needed. If you notice any new or changing spots or have other skin concerns in future, please call our office at [phone].     2. Skin , left lateral pretibia BASAL CELL CARCINOMA, NODULAR AND INFILTRATIVE PATTERNS, BASE INVOLVED --> basal cell skin cancer, type with "roots" that does not respond as well to scrape and burn treatments.  Options are: Mohs surgery with 98-99% cure rate vs ED&C with maybe 80% cure rate, or maybe a bit lower with due to the "roots". If it came back, could send for Mohs surgery then.    MAs please call and discuss and schedule vrs refer. If she wants to speak with me, just let me know. Thank you!

## 2021-12-02 ENCOUNTER — Encounter: Payer: Self-pay | Admitting: Dermatology

## 2021-12-23 ENCOUNTER — Other Ambulatory Visit: Payer: Self-pay | Admitting: Unknown Physician Specialty

## 2021-12-23 ENCOUNTER — Ambulatory Visit
Admission: RE | Admit: 2021-12-23 | Discharge: 2021-12-23 | Disposition: A | Discharge status: Home / Self Care | Payer: Medicare Other | Source: Ambulatory Visit | Attending: Unknown Physician Specialty | Admitting: Unknown Physician Specialty

## 2021-12-23 DIAGNOSIS — J019 Acute sinusitis, unspecified: Secondary | ICD-10-CM

## 2021-12-24 ENCOUNTER — Ambulatory Visit (INDEPENDENT_AMBULATORY_CARE_PROVIDER_SITE_OTHER): Payer: Medicare Other | Admitting: Dermatology

## 2021-12-24 ENCOUNTER — Encounter: Payer: Self-pay | Admitting: Dermatology

## 2021-12-24 DIAGNOSIS — C44719 Basal cell carcinoma of skin of left lower limb, including hip: Secondary | ICD-10-CM | POA: Diagnosis not present

## 2021-12-24 NOTE — Patient Instructions (Addendum)
Electrodesiccation and Curettage ("Scrape and Burn") Wound Care Instructions  Leave the original bandage on for 24 hours if possible.  If the bandage becomes soaked or soiled before that time, it is OK to remove it and examine the wound.  A small amount of post-operative bleeding is normal.  If excessive bleeding occurs, remove the bandage, place gauze over the site and apply continuous pressure (no peeking) over the area for 30 minutes. If this does not work, please call our clinic as soon as possible or page your doctor if it is after hours.   Once a day, cleanse the wound with soap and water. It is fine to shower. If a thick crust develops you may use a Q-tip dipped into dilute hydrogen peroxide (mix 1:1 with water) to dissolve it.  Hydrogen peroxide can slow the healing process, so use it only as needed.    After washing, apply petroleum jelly (Vaseline) or an antibiotic ointment if your doctor prescribed one for you, followed by a bandage.    For best healing, the wound should be covered with a layer of ointment at all times. If you are not able to keep the area covered with a bandage to hold the ointment in place, this may mean re-applying the ointment several times a day.  Continue this wound care until the wound has healed and is no longer open. It may take several weeks for the wound to heal and close.  Itching and mild discomfort is normal during the healing process.  If you have any discomfort, you can take Tylenol (acetaminophen) or ibuprofen as directed on the bottle. (Please do not take these if you have an allergy to them or cannot take them for another reason).  Some redness, tenderness and white or yellow material in the wound is normal healing.  If the area becomes very sore and red, or develops a thick yellow-green material (pus), it may be infected; please notify us.    Wound healing continues for up to one year following surgery. It is not unusual to experience pain in the scar  from time to time during the interval.  If the pain becomes severe or the scar thickens, you should notify the office.    A slight amount of redness in a scar is expected for the first six months.  After six months, the redness will fade and the scar will soften and fade.  The color difference becomes less noticeable with time.  If there are any problems, return for a post-op surgery check at your earliest convenience.  To improve the appearance of the scar, you can use silicone scar gel, cream, or sheets (such as Mederma or Serica) every night for up to one year. These are available over the counter (without a prescription).  Please call our office at [phone] for any questions or concerns.  After 10 days can switch over to blister bandaids.   5FU/CalcipotrieneLips once daily for 4 days, rest of face twice daily for 4 days. Begin treatment pending pathology results  Recommend taking Heliocare sun protection supplement daily in sunny weather for additional sun protection. For maximum protection on the sunniest days, you can take up to 2 capsules of regular Heliocare OR take 1 capsule of Heliocare Ultra. For prolonged exposure (such as a full day in the sun), you can repeat your dose of the supplement 4 hours after your first dose. Heliocare can be purchased at [employer], at some [facility] or at [email or web address].  Due to recent changes in healthcare laws, you may see results of your pathology and/or laboratory studies on MyChart before the doctors have had a chance to review them. We understand that in some cases there may be results that are confusing or concerning to you. Please understand that not all results are received at the same time and often the doctors may need to interpret multiple results in order to provide you with the best plan of care or course of treatment. Therefore, we ask that you please give [place] 2 business days to thoroughly review all your results before  contacting the office for clarification. Should we see a critical lab result, you will be contacted sooner.   If You Need Anything After Your Visit  If you have any questions or concerns for your doctor, please call our main line at [phone] and press option 4 to reach your doctor's medical assistant. If no one answers, please leave a voicemail as directed and we will return your call as soon as possible. Messages left after 4 pm will be answered the following business day.   You may also send [place] a message via [place]. We typically respond to MyChart messages within 1-2 business days.  For prescription refills, please ask your pharmacy to contact our office. Our fax number is [phone].  If you have an urgent issue when the clinic is closed that cannot wait until the next business day, you can page your doctor at the number below.    Please note that while we do our best to be available for urgent issues outside of office hours, we are not available 24/7.   If you have an urgent issue and are unable to reach [place], you may choose to seek medical care at your doctor's office, retail clinic, urgent care center, or emergency room.  If you have a medical emergency, please immediately call 911 or go to the emergency department.  Pager Numbers  - Dr. Nehemiah Massed: [phone]  - Dr. Laurence Ferrari: [phone]  - Dr. Nicole Kindred: [phone]  In the event of inclement weather, please call our main line at [phone] for an update on the status of any delays or closures.  Dermatology Medication Tips: Please keep the boxes that topical medications come in in order to help keep track of the instructions about where and how to use these. Pharmacies typically print the medication instructions only on the boxes and not directly on the medication tubes.   If your medication is too expensive, please contact our office at [phone] option 4 or send [place] a message through [place].   We are unable to tell  what your co-pay for medications will be in advance as this is different depending on your insurance coverage. However, we may be able to find a substitute medication at lower cost or fill out paperwork to get insurance to cover a needed medication.   If a prior authorization is required to get your medication covered by your insurance company, please allow [place] 1-2 business days to complete this process.  Drug prices often vary depending on where the prescription is filled and some pharmacies may offer cheaper prices.  The website www.goodrx.com contains coupons for medications through different pharmacies. The prices here do not account for what the cost may be with help from insurance (it may be cheaper with your insurance), but the website can give you the price if you did not use any insurance.  - You can print the associated coupon and take it  with your prescription to the pharmacy.  - You may also stop by our office during regular business hours and pick up a GoodRx coupon card.  - If you need your prescription sent electronically to a different pharmacy, notify our office through [facility] or by phone at [phone] option 4.     Si Usted Necesita Algo Despus de Su Visita  Tambin puede enviarnos un mensaje a travs de Pharmacist, community. Por lo general respondemos a los mensajes de MyChart en el transcurso de 1 a 2 das hbiles.  Para renovar recetas, por favor pida a su farmacia que se ponga en contacto con nuestra oficina. Harland Dingwall de fax es [place] [phone].  Si tiene un asunto urgente cuando la clnica est cerrada y que no puede esperar hasta el siguiente da hbil, puede llamar/localizar a su doctor(a) al nmero que aparece a continuacin.   Por favor, tenga en cuenta que aunque hacemos todo lo posible para estar disponibles para asuntos urgentes fuera del horario de [place], no estamos disponibles las 24 horas del da, los 7 das de la [place].   Si tiene un problema  urgente y no puede comunicarse con nosotros, puede optar por buscar atencin mdica  en el consultorio de su doctor(a), en una clnica privada, en un centro de atencin urgente o en una sala de emergencias.  Si tiene Engineering geologist, por favor llame inmediatamente al 911 o vaya a la sala de emergencias.  Nmeros de bper  - Dr. Nehemiah Massed: [phone]  - Dra. Moye: [phone]  - Dra. Nicole Kindred: [phone]  En caso de inclemencias del [place], por favor llame a Johnsie Kindred principal al [phone] para una actualizacin sobre el [place] de cualquier retraso o cierre.  Consejos para la medicacin en dermatologa: Por favor, guarde las cajas en las que vienen los medicamentos de uso tpico para ayudarle a seguir las instrucciones sobre dnde y cmo usarlos. Las farmacias generalmente imprimen las instrucciones del medicamento slo en las cajas y no directamente en los tubos del [place].   Si su medicamento es muy caro, por favor, pngase en contacto con Zigmund Daniel llamando al [phone] y presione la opcin 4 o envenos un mensaje a travs de Pharmacist, community.   No podemos decirle cul ser su copago por los medicamentos por adelantado ya que esto es diferente dependiendo de la cobertura de su seguro. Sin embargo, es posible que podamos encontrar un medicamento sustituto a Electrical engineer un formulario para que el seguro cubra el medicamento que se considera necesario.   Si se requiere una autorizacin previa para que su compaa de seguros [place] su medicamento, por favor permtanos de 1 a 2 das hbiles para completar este proceso.  Los precios de los medicamentos varan con frecuencia dependiendo del Environmental consultant de dnde se surte la receta y alguna farmacias pueden ofrecer precios ms baratos.  El sitio web www.goodrx.com tiene cupones para medicamentos de Airline pilot. Los precios aqu no tienen en cuenta lo que podra costar con la ayuda del seguro (puede ser ms barato con  su seguro), pero el sitio web puede darle el precio si no utiliz Research scientist (physical sciences).  - Puede imprimir el cupn correspondiente y llevarlo con su receta a la farmacia.  - Tambin puede pasar por nuestra oficina durante el horario de atencin regular y Charity fundraiser una tarjeta de cupones de GoodRx.  - Si necesita que su receta se enve electrnicamente a Chiropodist, informe a nuestra oficina a travs de MyChart de [facility] o por telfono llamando al [phone] y presione la opcin 4.   Recommend Niacinamide or Nicotinamide '500mg'$  twice per day to lower risk of non-melanoma skin cancer by approximately 25%. This is usually available at [employer].

## 2021-12-24 NOTE — Progress Notes (Signed)
   Follow-Up Visit   Subjective  Taylor Hunt is a 71 y.o. female who presents for the following: Skin Cancer (Here for [facility] treatment of biopsy proven BCC. Left lateral pretibial. Bx: 11/26/2021).  The following portions of the chart were reviewed this encounter and updated as appropriate:  Tobacco  Allergies  Meds  Problems  Med Hx  Surg Hx  Fam Hx      Review of Systems: No other skin or systemic complaints except as noted in HPI or Assessment and Plan.   Objective  Well appearing patient in no apparent distress; mood and affect are within normal limits.  A focused examination was performed including left leg. Relevant physical exam findings are noted in the Assessment and Plan.  Left lateral pretibial Pink healing biopsy site   Assessment & Plan  Basal cell carcinoma (BCC) of skin of left lower extremity including hip Left lateral pretibial  Destruction of lesion  Destruction method: electrodesiccation and curettage   Informed consent: discussed and consent obtained   Timeout:  patient name, date of birth, surgical site, and procedure verified Anesthesia: the lesion was anesthetized in a standard fashion   Anesthetic:  1% lidocaine w/ epinephrine 1-100,000 buffered w/ 8.4% NaHCO3 Curettage performed in three different directions: Yes   Electrodesiccation performed over the curetted area: Yes   Curettage cycles:  3 Lesion length (cm):  0.5 Lesion width (cm):  0.4 Margin per side (cm):  0.7 Final wound size (cm):  1.9 Hemostasis achieved with:  electrodesiccation Outcome: patient tolerated procedure well with no complications   Post-procedure details: sterile dressing applied and wound care instructions given   Dressing type: petrolatum    Discussed: basal cell skin cancer, type with "roots" that does not respond as well to scrape and burn treatments.   Options are: Mohs surgery with 98-99% cure rate vs ED&C with maybe 80% cure rate, or maybe a bit lower with due to  the "roots". If it came back, could send for Mohs surgery then.    Patient elects for [facility] today. Will watch closely for recurrence. Plan Mohs if recurs.      Return for Follow Up As Scheduled (AK's).  I, Emelia Salisbury, CMA, am acting as scribe for [name], MD.  Documentation: I have reviewed the above documentation for accuracy and completeness, and I agree with the above.  [name], MD

## 2022-01-03 ENCOUNTER — Encounter: Payer: Self-pay | Admitting: Dermatology

## 2022-01-16 ENCOUNTER — Other Ambulatory Visit: Payer: Self-pay | Admitting: Internal Medicine

## 2022-01-16 DIAGNOSIS — R918 Other nonspecific abnormal finding of lung field: Secondary | ICD-10-CM

## 2022-01-30 ENCOUNTER — Ambulatory Visit
Admission: RE | Admit: 2022-01-30 | Discharge: 2022-01-30 | Disposition: A | Discharge status: Home / Self Care | Payer: Medicare Other | Source: Ambulatory Visit | Attending: Internal Medicine | Admitting: Internal Medicine

## 2022-01-30 DIAGNOSIS — R918 Other nonspecific abnormal finding of lung field: Secondary | ICD-10-CM | POA: Insufficient documentation

## 2022-01-30 LAB — POCT I-STAT CREATININE: Creatinine, Ser: 0.6 mg/dL (ref 0.44–1.00)

## 2022-01-30 MED ORDER — IOHEXOL 300 MG/ML  SOLN
75.0000 mL | Freq: Once | INTRAMUSCULAR | Status: AC | PRN
  Administered 2022-01-30: 75 mL via INTRAVENOUS

## 2022-02-04 ENCOUNTER — Other Ambulatory Visit: Payer: Medicare Other

## 2022-02-24 ENCOUNTER — Other Ambulatory Visit: Payer: Self-pay | Admitting: Internal Medicine

## 2022-02-24 DIAGNOSIS — Z1231 Encounter for screening mammogram for malignant neoplasm of breast: Secondary | ICD-10-CM

## 2022-02-26 ENCOUNTER — Ambulatory Visit: Payer: Medicare Other | Admitting: Dermatology

## 2022-03-05 ENCOUNTER — Encounter: Payer: Self-pay | Admitting: Dermatology

## 2022-03-05 ENCOUNTER — Ambulatory Visit (INDEPENDENT_AMBULATORY_CARE_PROVIDER_SITE_OTHER): Payer: Medicare Other | Admitting: Dermatology

## 2022-03-05 DIAGNOSIS — L820 Inflamed seborrheic keratosis: Secondary | ICD-10-CM

## 2022-03-05 DIAGNOSIS — L578 Other skin changes due to chronic exposure to nonionizing radiation: Secondary | ICD-10-CM | POA: Diagnosis not present

## 2022-03-05 DIAGNOSIS — Z85828 Personal history of other malignant neoplasm of skin: Secondary | ICD-10-CM | POA: Diagnosis not present

## 2022-03-05 DIAGNOSIS — B079 Viral wart, unspecified: Secondary | ICD-10-CM | POA: Diagnosis not present

## 2022-03-05 DIAGNOSIS — L570 Actinic keratosis: Secondary | ICD-10-CM

## 2022-03-05 DIAGNOSIS — L720 Epidermal cyst: Secondary | ICD-10-CM

## 2022-03-05 NOTE — Progress Notes (Signed)
Follow-Up Visit   Subjective  Taylor Hunt is a 71 y.o. female who presents for the following: Follow-up (Patient here today for AK follow up. Patient used 5FU/calcipotriene at lips and face, patient advises she did have some reaction. ).  Patient would like spot at right popliteal, right lower leg below knee, left upper arm checked. Also with a scaly spot at nose that came off after treating with 5FU/calcipotriene but comes back. Feels a little rough and irritated right above bx site at left lower eyelid.   The following portions of the chart were reviewed this encounter and updated as appropriate:   Tobacco  Allergies  Meds  Problems  Med Hx  Surg Hx  Fam Hx      Review of Systems:  No other skin or systemic complaints except as noted in HPI or Assessment and Plan.  Objective  Well appearing patient in no apparent distress; mood and affect are within normal limits.  A focused examination was performed including face, arms, legs. Relevant physical exam findings are noted in the Assessment and Plan.  right pretibia x 1, right post calf x 1 (2) Erythematous thin papules/macules with gritty scale.   Head - Anterior (Face) Smooth white papule(s).   Left Upper Arm Erythematous stuck-on, waxy papule or plaque  Right Popliteal Fossa Verrucous papules -- Discussed viral etiology and contagion.     Assessment & Plan  AK (actinic keratosis) (2) right pretibia x 1, right post calf x 1  Plan to treat nasal dorsum, left dorsal hand at follow up as she has an event tonight.   Consider bx at right pretibia at follow up if not resolved.  Actinic keratoses are precancerous spots that appear secondary to cumulative UV radiation exposure/sun exposure over time. They are chronic with expected duration over 1 year. A portion of actinic keratoses will progress to squamous cell carcinoma of the skin. It is not possible to reliably predict which spots will progress to skin cancer and so  treatment is recommended to prevent development of skin cancer.  Recommend daily broad spectrum sunscreen SPF 30+ to sun-exposed areas, reapply every 2 hours as needed.  Recommend staying in the shade or wearing long sleeves, sun glasses (UVA+UVB protection) and wide brim hats (4-inch brim around the entire circumference of the hat). Call for new or changing lesions.  Prior to procedure, discussed risks of blister formation, small wound, skin dyspigmentation, or rare scar following cryotherapy. Recommend Vaseline ointment to treated areas while healing.   Destruction of lesion - right pretibia x 1, right post calf x 1  Destruction method: cryotherapy   Informed consent: discussed and consent obtained   Lesion destroyed using liquid nitrogen: Yes   Cryotherapy cycles:  2 Outcome: patient tolerated procedure well with no complications   Post-procedure details: wound care instructions given    Related Medications fluorouracil (EFUDEX) 5 % cream Apply topically daily. Dispense 15g compounded with calcipotriene. Apply once daily x 4 days to bottom lip. Start taking valacyclovir tablets as directed 24 hours before starting this cream.  Milia Head - Anterior (Face)  Milia - tiny firm white papules - type of cyst - benign - may be extracted if symptomatic - observe   Inflamed seborrheic keratosis Left Upper Arm  Benign-appearing.  Observation.  Call clinic for new or changing lesions.    Patient deferred treatment at this time.   Viral warts, unspecified type Right Popliteal Fossa  Discussed viral etiology and risk of spread.  Discussed multiple treatments may be required to clear warts.  Discussed possible post-treatment dyspigmentation and risk of recurrence.  Prior to procedure, discussed risks of blister formation, small wound, skin dyspigmentation, or rare scar following cryotherapy. Recommend Vaseline ointment to treated areas while healing.    Destruction of lesion -  Right Popliteal Fossa  Destruction method: cryotherapy   Informed consent: discussed and consent obtained   Lesion destroyed using liquid nitrogen: Yes   Cryotherapy cycles:  2 Outcome: patient tolerated procedure well with no complications   Post-procedure details: wound care instructions given     Actinic Damage with PreCancerous Actinic Keratoses Counseling for Topical Chemotherapy Management: Patient exhibits: - Severe, confluent actinic changes with pre-cancerous actinic keratoses that is secondary to cumulative UV radiation exposure over time - Condition that is severe; chronic, not at goal. - diffuse scaly erythematous macules and papules with underlying dyspigmentation - Discussed Prescription "Field Treatment" topical Chemotherapy for Severe, Chronic Confluent Actinic Changes with Pre-Cancerous Actinic Keratoses Field treatment involves treatment of an entire area of skin that has confluent Actinic Changes (Sun/ Ultraviolet light damage) and PreCancerous Actinic Keratoses by method of PhotoDynamic Therapy (PDT) and/or prescription Topical Chemotherapy agents such as 5-fluorouracil, 5-fluorouracil/calcipotriene, and/or imiquimod.  The purpose is to decrease the number of clinically evident and subclinical PreCancerous lesions to prevent progression to development of skin cancer by chemically destroying early precancer changes that may or may not be visible.  It has been shown to reduce the risk of developing skin cancer in the treated area. As a result of treatment, redness, scaling, crusting, and open sores may occur during treatment course. One or more than one of these methods may be used and may have to be used several times to control, suppress and eliminate the PreCancerous changes. Discussed treatment course, expected reaction, and possible side effects. - Recommend daily broad spectrum sunscreen SPF 30+ to sun-exposed areas, reapply every 2 hours as needed.  - Staying in the shade  or wearing long sleeves, sun glasses (UVA+UVB protection) and wide brim hats (4-inch brim around the entire circumference of the hat) are also recommended. - Call for new or changing lesions. - Start 5-fluorouracil/calcipotriene cream twice a day for 4 days to affected areas including nose, cheeks, and once daily x 4 days to lower lip.  Patient provided with handout reviewing treatment course and side effects and advised to call or message [place] on MyChart with any concerns. Reviewed course of treatment and expected reaction.  Patient advised to expect inflammation and crusting and advised that erosions are possible.  Patient advised to be diligent with sun protection during and after treatment. Counseled to keep medication out of reach of children and pets.  History of Basal Cell Carcinoma of the Skin - No evidence of recurrence today - Recommend regular full body skin exams - Recommend daily broad spectrum sunscreen SPF 30+ to sun-exposed areas, reapply every 2 hours as needed.  - Call if any new or changing lesions are noted between office visits  Return for 4-6 weeks to treat AKs.  Graciella Belton, RMA, am acting as scribe for [name], MD .  Documentation: I have reviewed the above documentation for accuracy and completeness, and I agree with the above.  [name], MD

## 2022-03-05 NOTE — Patient Instructions (Addendum)
- Start 5-fluorouracil/calcipotriene cream twice a day for 4 days to affected areas including nose, cheeks, once daily x 4 days to lower lip. Patient provided with handout reviewing treatment course and side effects and advised to call or message [place] on MyChart with any concerns.  Due to recent changes in healthcare laws, you may see results of your pathology and/or laboratory studies on MyChart before the doctors have had a chance to review them. We understand that in some cases there may be results that are confusing or concerning to you. Please understand that not all results are received at the same time and often the doctors may need to interpret multiple results in order to provide you with the best plan of care or course of treatment. Therefore, we ask that you please give [place] 2 business days to thoroughly review all your results before contacting the office for clarification. Should we see a critical lab result, you will be contacted sooner.   If You Need Anything After Your Visit  If you have any questions or concerns for your doctor, please call our main line at [phone] and press option 4 to reach your doctor's medical assistant. If no one answers, please leave a voicemail as directed and we will return your call as soon as possible. Messages left after 4 pm will be answered the following business day.   You may also send [place] a message via [place]. We typically respond to MyChart messages within 1-2 business days.  For prescription refills, please ask your pharmacy to contact our office. Our fax number is [phone].  If you have an urgent issue when the clinic is closed that cannot wait until the next business day, you can page your doctor at the number below.    Please note that while we do our best to be available for urgent issues outside of office hours, we are not available 24/7.   If you have an urgent issue and are unable to reach [place], you may choose to seek medical care at your  doctor's office, retail clinic, urgent care center, or emergency room.  If you have a medical emergency, please immediately call 911 or go to the emergency department.  Pager Numbers  - Dr. Nehemiah Massed: [phone]  - Dr. Laurence Ferrari: [phone]  - Dr. Nicole Kindred: [phone]  In the event of inclement weather, please call our main line at [phone] for an update on the status of any delays or closures.  Dermatology Medication Tips: Please keep the boxes that topical medications come in in order to help keep track of the instructions about where and how to use these. Pharmacies typically print the medication instructions only on the boxes and not directly on the medication tubes.   If your medication is too expensive, please contact our office at [phone] option 4 or send [place] a message through [place].   We are unable to tell what your co-pay for medications will be in advance as this is different depending on your insurance coverage. However, we may be able to find a substitute medication at lower cost or fill out paperwork to get insurance to cover a needed medication.   If a prior authorization is required to get your medication covered by your insurance company, please allow [place] 1-2 business days to complete this process.  Drug prices often vary depending on where the prescription is filled and some pharmacies may offer cheaper prices.  The website www.goodrx.com contains coupons for medications through different pharmacies. The prices here  do not account for what the cost may be with help from insurance (it may be cheaper with your insurance), but the website can give you the price if you did not use any insurance.  - You can print the associated coupon and take it with your prescription to the pharmacy.  - You may also stop by our office during regular business hours and pick up a GoodRx coupon card.  - If you need your prescription sent electronically to a different pharmacy, notify  our office through [facility] or by phone at [phone] option 4.     Si Usted Necesita Algo Despus de Su Visita  Tambin puede enviarnos un mensaje a travs de Pharmacist, community. Por lo general respondemos a los mensajes de MyChart en el transcurso de 1 a 2 das hbiles.  Para renovar recetas, por favor pida a su farmacia que se ponga en contacto con nuestra oficina. Harland Dingwall de fax es [place] [phone].  Si tiene un asunto urgente cuando la clnica est cerrada y que no puede esperar hasta el siguiente da hbil, puede llamar/localizar a su doctor(a) al nmero que aparece a continuacin.   Por favor, tenga en cuenta que aunque hacemos todo lo posible para estar disponibles para asuntos urgentes fuera del horario de [place], no estamos disponibles las 24 horas del da, los 7 das de la [place].   Si tiene un problema urgente y no puede comunicarse con nosotros, puede optar por buscar atencin mdica  en el consultorio de su doctor(a), en una clnica privada, en un centro de atencin urgente o en una sala de emergencias.  Si tiene Engineering geologist, por favor llame inmediatamente al 911 o vaya a la sala de emergencias.  Nmeros de bper  - Dr. Nehemiah Massed: [phone]  - Dra. Moye: [phone]  - Dra. Nicole Kindred: [phone]  En caso de inclemencias del [place], por favor llame a Johnsie Kindred principal al [phone] para una actualizacin sobre el [place] de cualquier retraso o cierre.  Consejos para la medicacin en dermatologa: Por favor, guarde las cajas en las que vienen los medicamentos de uso tpico para ayudarle a seguir las instrucciones sobre dnde y cmo usarlos. Las farmacias generalmente imprimen las instrucciones del medicamento slo en las cajas y no directamente en los tubos del [place].   Si su medicamento es muy caro, por favor, pngase en contacto con Zigmund Daniel llamando al [phone] y presione la opcin 4 o envenos un mensaje a travs de  Pharmacist, community.   No podemos decirle cul ser su copago por los medicamentos por adelantado ya que esto es diferente dependiendo de la cobertura de su seguro. Sin embargo, es posible que podamos encontrar un medicamento sustituto a Electrical engineer un formulario para que el seguro cubra el medicamento que se considera necesario.   Si se requiere una autorizacin previa para que su compaa de seguros [place] su medicamento, por favor permtanos de 1 a 2 das hbiles para completar este proceso.  Los precios de los medicamentos varan con frecuencia dependiendo del Environmental consultant de dnde se surte la receta y alguna farmacias pueden ofrecer precios ms baratos.  El sitio web www.goodrx.com tiene cupones para medicamentos de Airline pilot. Los precios aqu no tienen en cuenta lo que podra costar con la ayuda del seguro (puede ser ms barato con su seguro), pero el sitio web puede darle el precio si no utiliz Research scientist (physical sciences).  - Puede imprimir el cupn correspondiente y llevarlo con su receta a la farmacia.  -  Tambin puede pasar por nuestra oficina durante el horario de atencin regular y Charity fundraiser una tarjeta de cupones de GoodRx.  - Si necesita que su receta se enve electrnicamente a una farmacia diferente, informe a nuestra oficina a travs de MyChart de [place] o por telfono llamando al [phone] y presione la opcin 4.

## 2022-04-08 ENCOUNTER — Ambulatory Visit
Admission: RE | Admit: 2022-04-08 | Discharge: 2022-04-08 | Disposition: A | Discharge status: Home / Self Care | Payer: Medicare Other | Source: Ambulatory Visit | Attending: Internal Medicine | Admitting: Internal Medicine

## 2022-04-08 DIAGNOSIS — Z1231 Encounter for screening mammogram for malignant neoplasm of breast: Secondary | ICD-10-CM | POA: Insufficient documentation

## 2022-04-14 ENCOUNTER — Ambulatory Visit (INDEPENDENT_AMBULATORY_CARE_PROVIDER_SITE_OTHER): Payer: Medicare Other | Admitting: Dermatology

## 2022-04-14 DIAGNOSIS — L578 Other skin changes due to chronic exposure to nonionizing radiation: Secondary | ICD-10-CM | POA: Diagnosis not present

## 2022-04-14 DIAGNOSIS — D492 Neoplasm of unspecified behavior of bone, soft tissue, and skin: Secondary | ICD-10-CM

## 2022-04-14 DIAGNOSIS — I83813 Varicose veins of bilateral lower extremities with pain: Secondary | ICD-10-CM

## 2022-04-14 DIAGNOSIS — L570 Actinic keratosis: Secondary | ICD-10-CM | POA: Diagnosis not present

## 2022-04-14 DIAGNOSIS — Q828 Other specified congenital malformations of skin: Secondary | ICD-10-CM | POA: Diagnosis not present

## 2022-04-14 MED ORDER — FLUOROURACIL 5 % EX CREA: TOPICAL_CREAM | CUTANEOUS | 2 refills | Status: AC

## 2022-04-14 NOTE — Patient Instructions (Addendum)
Instructions for [facility] Medications  One or more of your medications was sent to the [facility] mail order compounding pharmacy. You will receive an email from them and can purchase the medicine through that link. It will then be mailed to your home at the address you confirmed. If for any reason you do not receive an email from them, please check your spam folder. If you still do not find the email, please let us know. [facility] phone number is [phone].      5-Fluorouracil/Calcipotriene Patient Education   Actinic keratoses are the dry, red scaly spots on the skin caused by sun damage. A portion of these spots can turn into skin cancer with time, and treating them can help prevent development of skin cancer.   Treatment of these spots requires removal of the defective skin cells. There are various ways to remove actinic keratoses, including freezing with liquid nitrogen, treatment with creams, or treatment with a blue light procedure in the office.   5-fluorouracil cream is a topical cream used to treat actinic keratoses. It works by interfering with the growth of abnormal fast-growing skin cells, such as actinic keratoses. These cells peel off and are replaced by healthy ones.   5-fluorouracil/calcipotriene is a combination of the 5-fluorouracil cream with a vitamin D analog cream called calcipotriene. The calcipotriene alone does not treat actinic keratoses. However, when it is combined with 5-fluorouracil, it helps the 5-fluorouracil treat the actinic keratoses much faster so that the same results can be achieved with a much shorter treatment time.  INSTRUCTIONS FOR 5-FLUOROURACIL/CALCIPOTRIENE CREAM:   5-fluorouracil/calcipotriene cream typically only needs to be used for 4-7 days. A thin layer should be applied twice a day to the treatment areas recommended by your physician.   If your physician prescribed you separate tubes of 5-fluourouracil and calcipotriene,  apply a thin layer of 5-fluorouracil followed by a thin layer of calcipotriene.   Avoid contact with your eyes, nostrils, and mouth. Do not use 5-fluorouracil/calcipotriene cream on infected or open wounds.   You will develop redness, irritation and some crusting at areas where you have pre-cancer damage/actinic keratoses. IF YOU DEVELOP PAIN, BLEEDING, OR SIGNIFICANT CRUSTING, STOP THE TREATMENT EARLY - you have already gotten a good response and the actinic keratoses should clear up well.  Wash your hands after applying 5-fluorouracil 5% cream on your skin.   A moisturizer or sunscreen with a minimum SPF 30 should be applied each morning.   Once you have finished the treatment, you can apply a thin layer of Vaseline twice a day to irritated areas to soothe and calm the areas more quickly. If you experience significant discomfort, contact your physician.  For some patients it is necessary to repeat the treatment for best results.  SIDE EFFECTS: When using 5-fluorouracil/calcipotriene cream, you may have mild irritation, such as redness, dryness, swelling, or a mild burning sensation. This usually resolves within 2 weeks. The more actinic keratoses you have, the more redness and inflammation you can expect during treatment. Eye irritation has been reported rarely. If this occurs, please let us know.  If you have any trouble using this cream, please call the office. If you have any other questions about this information, please do not hesitate to ask me before you leave the office.     Cryotherapy Aftercare  Wash gently with soap and water everyday.   Apply Vaseline and Band-Aid daily until healed.     Due to recent changes in healthcare laws,  you may see results of your pathology and/or laboratory studies on MyChart before the doctors have had a chance to review them. We understand that in some cases there may be results that are confusing or concerning to you. Please understand that not  all results are received at the same time and often the doctors may need to interpret multiple results in order to provide you with the best plan of care or course of treatment. Therefore, we ask that you please give [place] 2 business days to thoroughly review all your results before contacting the office for clarification. Should we see a critical lab result, you will be contacted sooner.   If You Need Anything After Your Visit  If you have any questions or concerns for your doctor, please call our main line at [phone] and press option 4 to reach your doctor's medical assistant. If no one answers, please leave a voicemail as directed and we will return your call as soon as possible. Messages left after 4 pm will be answered the following business day.   You may also send [place] a message via [place]. We typically respond to MyChart messages within 1-2 business days.  For prescription refills, please ask your pharmacy to contact our office. Our fax number is [phone].  If you have an urgent issue when the clinic is closed that cannot wait until the next business day, you can page your doctor at the number below.    Please note that while we do our best to be available for urgent issues outside of office hours, we are not available 24/7.   If you have an urgent issue and are unable to reach [place], you may choose to seek medical care at your doctor's office, retail clinic, urgent care center, or emergency room.  If you have a medical emergency, please immediately call 911 or go to the emergency department.  Pager Numbers  - Dr. Nehemiah Massed: [phone]  - Dr. Laurence Ferrari: [phone]  - Dr. Nicole Kindred: [phone]  In the event of inclement weather, please call our main line at [phone] for an update on the status of any delays or closures.  Dermatology Medication Tips: Please keep the boxes that topical medications come in in order to help keep track of the instructions about where and how to  use these. Pharmacies typically print the medication instructions only on the boxes and not directly on the medication tubes.   If your medication is too expensive, please contact our office at [phone] option 4 or send [place] a message through [place].   We are unable to tell what your co-pay for medications will be in advance as this is different depending on your insurance coverage. However, we may be able to find a substitute medication at lower cost or fill out paperwork to get insurance to cover a needed medication.   If a prior authorization is required to get your medication covered by your insurance company, please allow [place] 1-2 business days to complete this process.  Drug prices often vary depending on where the prescription is filled and some pharmacies may offer cheaper prices.  The website www.goodrx.com contains coupons for medications through different pharmacies. The prices here do not account for what the cost may be with help from insurance (it may be cheaper with your insurance), but the website can give you the price if you did not use any insurance.  - You can print the associated coupon and take it with your prescription to the pharmacy.  -  You may also stop by our office during regular business hours and pick up a GoodRx coupon card.  - If you need your prescription sent electronically to a different pharmacy, notify our office through [facility] or by phone at [phone] option 4.     Si Usted Necesita Algo Despus de Su Visita  Tambin puede enviarnos un mensaje a travs de Pharmacist, community. Por lo general respondemos a los mensajes de MyChart en el transcurso de 1 a 2 das hbiles.  Para renovar recetas, por favor pida a su farmacia que se ponga en contacto con nuestra oficina. Harland Dingwall de fax es [place] [phone].  Si tiene un asunto urgente cuando la clnica est cerrada y que no puede esperar hasta el siguiente da hbil, puede llamar/localizar a su  doctor(a) al nmero que aparece a continuacin.   Por favor, tenga en cuenta que aunque hacemos todo lo posible para estar disponibles para asuntos urgentes fuera del horario de [place], no estamos disponibles las 24 horas del da, los 7 das de la [place].   Si tiene un problema urgente y no puede comunicarse con nosotros, puede optar por buscar atencin mdica  en el consultorio de su doctor(a), en una clnica privada, en un centro de atencin urgente o en una sala de emergencias.  Si tiene Engineering geologist, por favor llame inmediatamente al 911 o vaya a la sala de emergencias.  Nmeros de bper  - Dr. Nehemiah Massed: [phone]  - Dra. Moye: [phone]  - Dra. Nicole Kindred: [phone]  En caso de inclemencias del [place], por favor llame a Johnsie Kindred principal al [phone] para una actualizacin sobre el [place] de cualquier retraso o cierre.  Consejos para la medicacin en dermatologa: Por favor, guarde las cajas en las que vienen los medicamentos de uso tpico para ayudarle a seguir las instrucciones sobre dnde y cmo usarlos. Las farmacias generalmente imprimen las instrucciones del medicamento slo en las cajas y no directamente en los tubos del [place].   Si su medicamento es muy caro, por favor, pngase en contacto con Zigmund Daniel llamando al [phone] y presione la opcin 4 o envenos un mensaje a travs de Pharmacist, community.   No podemos decirle cul ser su copago por los medicamentos por adelantado ya que esto es diferente dependiendo de la cobertura de su seguro. Sin embargo, es posible que podamos encontrar un medicamento sustituto a Electrical engineer un formulario para que el seguro cubra el medicamento que se considera necesario.   Si se requiere una autorizacin previa para que su compaa de seguros [place] su medicamento, por favor permtanos de 1 a 2 das hbiles para completar este proceso.  Los precios de los medicamentos varan con frecuencia dependiendo del  Environmental consultant de dnde se surte la receta y alguna farmacias pueden ofrecer precios ms baratos.  El sitio web www.goodrx.com tiene cupones para medicamentos de Airline pilot. Los precios aqu no tienen en cuenta lo que podra costar con la ayuda del seguro (puede ser ms barato con su seguro), pero el sitio web puede darle el precio si no utiliz Research scientist (physical sciences).  - Puede imprimir el cupn correspondiente y llevarlo con su receta a la farmacia.  - Tambin puede pasar por nuestra oficina durante el horario de atencin regular y Charity fundraiser una tarjeta de cupones de GoodRx.  - Si necesita que su receta se enve electrnicamente a Chiropodist, informe a nuestra oficina a travs de MyChart de [place] o por telfono llamando al [phone] y  presione la opcin 4.

## 2022-04-14 NOTE — Progress Notes (Unsigned)
Follow-Up Visit   Subjective  Taylor Hunt is a 71 y.o. female who presents for the following: Follow-up (6 weeks f/u on precancers on her legs and nose. Patient treated her lower lip with 5FU/Calcipotriene cream several months ago with a good response.). Patient c/o painful veins on her lower legs.   The following portions of the chart were reviewed this encounter and updated as appropriate:   Tobacco  Allergies  Meds  Problems  Med Hx  Surg Hx  Fam Hx      Review of Systems:  No other skin or systemic complaints except as noted in HPI or Assessment and Plan.  Objective  Well appearing patient in no apparent distress; mood and affect are within normal limits.  A focused examination was performed including face,arms,legs. Relevant physical exam findings are noted in the Assessment and Plan.  right distal pretibial x 1 Erythematous thin papules/macules with gritty scale.   Left Lower Leg - Anterior Pink thin papule with peripheral rim of scale  right cheek 0.25 cm symmetric  brown thin papule. without features suspicious for malignancy on dermoscopy     Assessment & Plan  AK (actinic keratosis) right distal pretibial x 1  Actinic keratoses are precancerous spots that appear secondary to cumulative UV radiation exposure/sun exposure over time. They are chronic with expected duration over 1 year. A portion of actinic keratoses will progress to squamous cell carcinoma of the skin. It is not possible to reliably predict which spots will progress to skin cancer and so treatment is recommended to prevent development of skin cancer.  Recommend daily broad spectrum sunscreen SPF 30+ to sun-exposed areas, reapply every 2 hours as needed.  Recommend staying in the shade or wearing long sleeves, sun glasses (UVA+UVB protection) and wide brim hats (4-inch brim around the entire circumference of the hat). Call for new or changing lesions.   Prior to procedure, discussed risks of  blister formation, small wound, skin dyspigmentation, or rare scar following cryotherapy. Recommend Vaseline ointment to treated areas while healing.   Destruction of lesion - right distal pretibial x 1 Complexity: simple   Destruction method: cryotherapy   Informed consent: discussed and consent obtained   Timeout:  patient name, date of birth, surgical site, and procedure verified Lesion destroyed using liquid nitrogen: Yes   Region frozen until ice ball extended beyond lesion: Yes   Outcome: patient tolerated procedure well with no complications   Post-procedure details: wound care instructions given    Related Medications fluorouracil (EFUDEX) 5 % cream Apply to Tops of hands, top of elbows  twice a day for 7 days,Apply to the full nose and right lateral cheek  twice a day for 4 days  Porokeratosis Left Lower Leg - Anterior  Benign-appearing.  Observation.  Call clinic for new or changing moles.  Recommend daily use of broad spectrum spf 30+ sunscreen to sun-exposed areas.    Neoplasm of skin right cheek  Ddx:  Nevus over SK   Benign-appearing.  Observation.  Call clinic for new or changing lesions.  Recommend daily use of broad spectrum spf 30+ sunscreen to sun-exposed areas.   The patient will observe these symptoms, and report promptly any worsening or unexpected persistence.  If well, may return prn.   Varicose veins of both lower extremities with pain  Related Procedures Ambulatory referral to Vascular Surgery  Varicose Veins/Spider Veins - Dilated blue, purple or red veins at the lower extremities - Reassured - Smaller vessels can be  treated by sclerotherapy (a procedure to inject a medicine into the veins to make them disappear) if desired, but the treatment is not covered by insurance. Larger vessels may be covered if symptomatic and we would refer to vascular surgeon if treatment desired.  Referral sent to [facility] due to pain    Actinic  Damage with PreCancerous Actinic Keratoses Counseling for Topical Chemotherapy Management: Patient exhibits: - Severe, confluent actinic changes with pre-cancerous actinic keratoses that is secondary to cumulative UV radiation exposure over time - Condition that is severe; chronic, not at goal. - diffuse scaly erythematous macules and papules with underlying dyspigmentation - Discussed Prescription "Field Treatment" topical Chemotherapy for Severe, Chronic Confluent Actinic Changes with Pre-Cancerous Actinic Keratoses  Start 5FU/Calcipotriene cream on Tops of hands, top of elbow apply twice a day for 7 days   Start 5FU/Calcipotriene  cream on On the full nose, right lateral cheek  twice a day for 4 days   Field treatment involves treatment of an entire area of skin that has confluent Actinic Changes (Sun/ Ultraviolet light damage) and PreCancerous Actinic Keratoses by method of PhotoDynamic Therapy (PDT) and/or prescription Topical Chemotherapy agents such as 5-fluorouracil, 5-fluorouracil/calcipotriene, and/or imiquimod.  The purpose is to decrease the number of clinically evident and subclinical PreCancerous lesions to prevent progression to development of skin cancer by chemically destroying early precancer changes that may or may not be visible.  It has been shown to reduce the risk of developing skin cancer in the treated area. As a result of treatment, redness, scaling, crusting, and open sores may occur during treatment course. One or more than one of these methods may be used and may have to be used several times to control, suppress and eliminate the PreCancerous changes. Discussed treatment course, expected reaction, and possible side effects. - Recommend daily broad spectrum sunscreen SPF 30+ to sun-exposed areas, reapply every 2 hours as needed.  - Staying in the shade or wearing long sleeves, sun glasses (UVA+UVB protection) and wide brim hats (4-inch brim around the entire circumference  of the hat) are also recommended. - Call for new or changing lesions.   Return in about 4 months (around 08/14/2022) for AKs.  I, Taylor Hunt, CMA, am acting as scribe for Taylor Gleason, MD .   Documentation: I have reviewed the above documentation for accuracy and completeness, and I agree with the above.  Taylor Gleason, MD

## 2022-04-15 ENCOUNTER — Encounter: Payer: Self-pay | Admitting: Dermatology

## 2022-05-08 ENCOUNTER — Other Ambulatory Visit: Payer: Self-pay | Admitting: Internal Medicine

## 2022-05-08 DIAGNOSIS — R1900 Intra-abdominal and pelvic swelling, mass and lump, unspecified site: Secondary | ICD-10-CM

## 2022-05-08 DIAGNOSIS — I4719 Other supraventricular tachycardia: Secondary | ICD-10-CM

## 2022-05-19 ENCOUNTER — Other Ambulatory Visit (INDEPENDENT_AMBULATORY_CARE_PROVIDER_SITE_OTHER): Payer: Self-pay | Admitting: Nurse Practitioner

## 2022-05-19 DIAGNOSIS — I83813 Varicose veins of bilateral lower extremities with pain: Secondary | ICD-10-CM

## 2022-05-20 ENCOUNTER — Ambulatory Visit (INDEPENDENT_AMBULATORY_CARE_PROVIDER_SITE_OTHER): Payer: Medicare Other | Admitting: Nurse Practitioner

## 2022-05-20 ENCOUNTER — Encounter (INDEPENDENT_AMBULATORY_CARE_PROVIDER_SITE_OTHER): Payer: Self-pay | Admitting: Nurse Practitioner

## 2022-05-20 ENCOUNTER — Ambulatory Visit (INDEPENDENT_AMBULATORY_CARE_PROVIDER_SITE_OTHER): Payer: Medicare Other

## 2022-05-20 VITALS — BP 124/75 | HR 65 | Ht 64.0 in | Wt 143.0 lb

## 2022-05-20 DIAGNOSIS — I83813 Varicose veins of bilateral lower extremities with pain: Secondary | ICD-10-CM

## 2022-05-20 DIAGNOSIS — R252 Cramp and spasm: Secondary | ICD-10-CM

## 2022-05-20 DIAGNOSIS — E7849 Other hyperlipidemia: Secondary | ICD-10-CM | POA: Diagnosis not present

## 2022-05-26 ENCOUNTER — Ambulatory Visit
Admission: RE | Admit: 2022-05-26 | Discharge: 2022-05-26 | Disposition: A | Discharge status: Home / Self Care | Payer: Medicare Other | Source: Ambulatory Visit | Attending: Internal Medicine | Admitting: Internal Medicine

## 2022-05-26 DIAGNOSIS — I4719 Other supraventricular tachycardia: Secondary | ICD-10-CM | POA: Diagnosis present

## 2022-05-26 DIAGNOSIS — R1900 Intra-abdominal and pelvic swelling, mass and lump, unspecified site: Secondary | ICD-10-CM | POA: Insufficient documentation

## 2022-05-26 MED ORDER — GADOBUTROL 1 MMOL/ML IV SOLN
7.0000 mL | Freq: Once | INTRAVENOUS | Status: AC | PRN
  Administered 2022-05-26: 7 mL via INTRAVENOUS

## 2022-05-31 ENCOUNTER — Encounter (INDEPENDENT_AMBULATORY_CARE_PROVIDER_SITE_OTHER): Payer: Self-pay | Admitting: Nurse Practitioner

## 2022-05-31 NOTE — Progress Notes (Signed)
Subjective:    Patient ID: Taylor Hunt, female    DOB: 02/03/1951, 72 y.o.   MRN: [id number] Chief Complaint  Patient presents with   New Patient (Initial Visit)    np. le reflux + consult. bilateral VV w/pain. referred by Alfonso Patten    The patient is seen for evaluation of symptomatic varicose veins. The patient relates burning and stinging which worsened steadily throughout the course of the day, particularly with standing. The patient also notes an aching and throbbing pain over the varicosities, particularly with prolonged dependent positions. The symptoms are significantly improved with elevation.  The patient also notes that during hot weather the symptoms are greatly intensified. The patient states the pain from the varicose veins interferes with work, daily exercise, shopping and household maintenance. At this point, the symptoms are persistent and severe enough that they're having a negative impact on lifestyle and are interfering with daily activities.  She also notes that her legs feel tired and heavy and she has not been able to be as active or run the same way that she has been over the years.  There is no history of DVT, PE or superficial thrombophlebitis. There is no history of ulceration or hemorrhage. The patient denies a significant family history of varicose veins.  The patient has worn medical grade compression stockings for years as recommended by her dermatologist.  At the present time the patient has not been using over-the-counter analgesics. There is no history of prior surgical intervention or sclerotherapy.  Noninvasive studies show no evidence of DVT or superficial thrombophlebitis bilaterally.  Deep venous insufficiency is noted in the right with superficial venous reflux noted in the bilateral great saphenous veins.      Review of Systems  All other systems reviewed and are negative.      Objective:   Physical Exam Vitals reviewed.  HENT:     Head:  Normocephalic.  Cardiovascular:     Rate and Rhythm: Normal rate.     Pulses:          Dorsalis pedis pulses are 1+ on the right side and 1+ on the left side.  Pulmonary:     Effort: Pulmonary effort is normal.  Musculoskeletal:        General: Tenderness present.  Skin:    General: Skin is warm and dry.  Neurological:     Mental Status: She is alert and oriented to person, place, and time.  Psychiatric:        Mood and Affect: Mood normal.        Behavior: Behavior normal.        Thought Content: Thought content normal.        Judgment: Judgment normal.     BP 124/75   Pulse 65   Ht '5\' 4"'$  (1.626 m)   Wt 143 lb (64.9 kg)   BMI 24.55 kg/m   Past Medical History:  Diagnosis Date   Anemia    Anxiety    Arthritis    Basal cell carcinoma 10/24/2020   right nasal tip, Moh's 01/09/2021   BCC (basal cell carcinoma) 11/26/2021   Left lateral pretibia. EDC 12/24/2021   Cancer (HCC)    skin   CHF (congestive heart failure) (HCC)    GERD (gastroesophageal reflux disease)    Headache    Heart murmur    Hx of dysplastic nevus 10/15/2009   R dorsum 2nd toe, mild atypia   Hx of dysplastic nevus 11/07/2012  R mid anterior thigh, modterate atypia   Hyperlipemia    Hypothyroidism    Pneumonia    PONV (postoperative nausea and vomiting)    Squamous cell carcinoma of skin 09/15/2006   L forearm, excised   Squamous cell carcinoma of skin 03/21/2008   R lower leg, SCCIS excised   Squamous cell carcinoma of skin 07/19/2008   R forearm   Squamous cell carcinoma of skin 10/25/2012   L mid dorsum forearm   Squamous cell carcinoma of skin 01/24/2014   L posterior lower leg    Social History   Socioeconomic History   Marital status: Single    Spouse name: Not on file   Number of children: Not on file   Years of education: Not on file   Highest education level: Not on file  Occupational History   Not on file  Tobacco Use   Smoking status: Never   Smokeless tobacco: Never   Vaping Use   Vaping Use: Never used  Substance and Sexual Activity   Alcohol use: Yes    Comment: occcassionally    Drug use: No   Sexual activity: Not on file  Other Topics Concern   Not on file  Social History Narrative   Not on file   Social Determinants of Health   Financial Resource Strain: Not on file  Food Insecurity: Not on file  Transportation Needs: Not on file  Physical Activity: Not on file  Stress: Not on file  Social Connections: Not on file  Intimate Partner Violence: Not on file    Past Surgical History:  Procedure Laterality Date   ANTERIOR CERVICAL DECOMP/DISCECTOMY FUSION N/A 09/18/2015   Procedure: ANTERIOR CERVICAL DECOMPRESSION/DISCECTOMY INTERBODY FUSION PLATING BONEGRAFT CERVICAL FIVE-SIX ,CERVICAL SIX-SEVEN ;  Surgeon: Newman Pies, MD;  Location: [place] [facility];  Service: Neurosurgery;  Laterality: N/A;   APPENDECTOMY     both shoulder surgery     BREAST EXCISIONAL BIOPSY Right    @ 2000 2 areas excised   CHOLECYSTECTOMY     COLONOSCOPY     COLONOSCOPY WITH PROPOFOL N/A 12/27/2019   Procedure: COLONOSCOPY WITH PROPOFOL;  Surgeon: Robert Bellow, MD;  Location: [facility];  Service: Endoscopy;  Laterality: N/A;   EYE SURGERY     NEUROPLASTY / TRANSPOSITION ULNAR NERVE AT ELBOW  02/1996   OOPHORECTOMY     right knee surgery     skin cancer removed     TONSILLECTOMY      Family History  Problem Relation Age of Onset   Pancreatic cancer Mother    Lung cancer Father    Pancreatic cancer Maternal Uncle    Pancreatic cancer Brother    Melanoma Maternal Grandfather    Breast cancer Neg Hx     Allergies  Allergen Reactions   Fentanyl Nausea And Vomiting   Codeine Other (See Comments)    Dizziness, nausea, vomiting   Gabapentin Other (See Comments)   Hydrocodone Nausea And Vomiting   Lovastatin Other (See Comments)    Muscle pain   Morphine Hives   Tramadol Nausea And Vomiting       Latest Ref Rng & Units 07/21/2017    4:50  AM 07/20/2017    6:00 PM 07/19/2017   12:03 PM  CBC  WBC 3.6 - 11.0 K/uL 4.5  5.5  6.2   Hemoglobin 12.0 - 16.0 g/dL 12.0  14.3  14.7   Hematocrit 35.0 - 47.0 % 35.6  43.3  45.1   Platelets 150 -  440 K/uL 264  291  276       CMP     Component Value Date/Time   NA 140 07/21/2017 0450   K 3.7 07/21/2017 0450   CL 109 07/21/2017 0450   CO2 23 07/21/2017 0450   GLUCOSE 103 (H) 07/21/2017 0450   BUN 11 07/21/2017 0450   CREATININE 0.60 01/30/2022 1147   CALCIUM 8.4 (L) 07/21/2017 0450   PROT 8.0 07/20/2017 1800   ALBUMIN 4.4 07/20/2017 1800   AST 147 (H) 07/20/2017 1800   ALT 145 (H) 07/20/2017 1800   ALKPHOS 99 07/20/2017 1800   BILITOT 0.6 07/20/2017 1800   GFRNONAA >60 07/21/2017 0450   GFRAA >60 07/21/2017 0450     No results found.     Assessment & Plan:   1. Varicose veins of both lower extremities with pain Recommend  I have reviewed my previous  discussion with the patient regarding  varicose veins and why they cause symptoms. Patient will continue  wearing graduated compression stockings class 1 on a daily basis, beginning first thing in the morning and removing them in the evening.  The patient is CEAP [id number].  The patient has been wearing compression for more than 12 weeks with no or little benefit.  The patient has been exercising daily for more than 12 weeks. The patient has been elevating and taking OTC pain medications for more than 12 weeks.  None of these have have eliminated the pain related to the varicose veins and venous reflux or the discomfort regarding venous congestion.    In addition, behavioral modification including elevation during the day was again discussed and this will continue.  The patient has utilized over the counter pain medications and has been exercising.  However, at this time conservative therapy has not alleviated the patient's symptoms of leg pain and swelling  Recommend: laser ablation of the right and  left great saphenous  veins to eliminate the symptoms of pain and swelling of the lower extremities caused by the severe superficial venous reflux disease.   2. Familial combined hyperlipidemia Continue statin as ordered and reviewed, no changes at this time  3. Cramps, muscle, general The patient has a significant history of hyperlipidemia and some of the symptoms are concerning for possible claudication-like symptoms.  Will have her return with ABIs and aortoiliac to ensure that there is not any underlying peripheral arterial disease accounting for her issues.   Current Outpatient Medications on File Prior to Visit  Medication Sig Dispense Refill   acetaminophen (TYLENOL) 325 MG tablet Take 650 mg by mouth every 6 (six) hours as needed for moderate pain or headache.     Alirocumab (PRALUENT) 150 MG/ML SOAJ Inject into the skin.     B Complex-C (B-COMPLEX WITH VITAMIN C) tablet Take 1 tablet by mouth daily.      Calcium Carbonate-Vit D-Min (CALTRATE 600+D PLUS PO) Take by mouth 2 (two) times daily with a meal.     celecoxib (CELEBREX) 200 MG capsule TAKE 1 CAPSULE (200 MG TOTAL) BY MOUTH 2 (TWO) TIMES A DAY.     cyclobenzaprine (FLEXERIL) 10 MG tablet Take 10 mg by mouth at bedtime.     diclofenac sodium (VOLTAREN) 1 % GEL Apply 2 g topically 4 (four) times daily.      diltiazem (CARDIZEM CD) 240 MG 24 hr capsule Take 240 mg by mouth daily.     fluorouracil (EFUDEX) 5 % cream Apply to Tops of hands, top of elbows  twice  a day for 7 days,Apply to the full nose and right lateral cheek  twice a day for 4 days 60 g 2   fluticasone (FLONASE) 50 MCG/ACT nasal spray Place 1 spray into both nostrils daily as needed for allergies or rhinitis.      hydrochlorothiazide (HYDRODIURIL) 25 MG tablet Take by mouth.     magnesium oxide (MAG-OX) 400 MG tablet Take 400 mg by mouth daily.     montelukast (SINGULAIR) 10 MG tablet TAKE ONE TABLET BY MOUTH EVERY NIGHT AT BEDTIME     Multiple Vitamin (MULTIVITAMIN WITH MINERALS) TABS  tablet Take 1 tablet by mouth daily.     mupirocin ointment (BACTROBAN) 2 % Apply 1 application topically daily. 22 g 1   vitamin B-12 (CYANOCOBALAMIN) 1000 MCG tablet Take 1,000 mcg by mouth daily.      Zinc Citrate-Phytase 25-500 MG CAPS Take by mouth daily.     valACYclovir (VALTREX) 1000 MG tablet Take 2 tablets in the morning and 2 tablets in the evening for one day, as needed for flare     No current facility-administered medications on file prior to visit.    There are no Patient Instructions on file for this visit. No follow-ups on file.   Kris Hartmann, NP

## 2022-06-01 ENCOUNTER — Encounter: Payer: Self-pay | Admitting: Dermatology

## 2022-07-01 ENCOUNTER — Other Ambulatory Visit (INDEPENDENT_AMBULATORY_CARE_PROVIDER_SITE_OTHER): Payer: Self-pay | Admitting: Nurse Practitioner

## 2022-07-01 DIAGNOSIS — R252 Cramp and spasm: Secondary | ICD-10-CM

## 2022-07-03 ENCOUNTER — Encounter (INDEPENDENT_AMBULATORY_CARE_PROVIDER_SITE_OTHER): Payer: Self-pay | Admitting: Vascular Surgery

## 2022-07-03 ENCOUNTER — Ambulatory Visit (INDEPENDENT_AMBULATORY_CARE_PROVIDER_SITE_OTHER): Payer: Medicare Other

## 2022-07-03 ENCOUNTER — Ambulatory Visit (INDEPENDENT_AMBULATORY_CARE_PROVIDER_SITE_OTHER): Payer: Medicare Other | Admitting: Vascular Surgery

## 2022-07-03 VITALS — BP 133/73 | HR 68 | Resp 16 | Wt 147.2 lb

## 2022-07-03 DIAGNOSIS — R252 Cramp and spasm: Secondary | ICD-10-CM

## 2022-07-03 DIAGNOSIS — M79605 Pain in left leg: Secondary | ICD-10-CM | POA: Diagnosis not present

## 2022-07-03 DIAGNOSIS — I83813 Varicose veins of bilateral lower extremities with pain: Secondary | ICD-10-CM | POA: Diagnosis not present

## 2022-07-03 DIAGNOSIS — M79609 Pain in unspecified limb: Secondary | ICD-10-CM | POA: Insufficient documentation

## 2022-07-03 DIAGNOSIS — M79604 Pain in right leg: Secondary | ICD-10-CM

## 2022-07-03 NOTE — Assessment & Plan Note (Signed)
The patient has a previous reflux study described in her last note.  She has attempted wearing compression socks, elevating her legs, and remained as active as possible for many years.  Despite this, she has persistent pain, heaviness, and prominent painful varicosities in both lower extremities.  Laser ablation of the saphenous veins are planned and pending at this point.

## 2022-07-03 NOTE — Assessment & Plan Note (Signed)
We did noninvasive arterial studies today which were entirely normal with no aortoiliac stenosis by duplex with normal ABIs and waveforms bilaterally.  Likely component of pain from venous disease as well as neuropathic pain or musculoskeletal issues.

## 2022-07-03 NOTE — Progress Notes (Signed)
MRN : [id number]  Taylor Hunt is a 72 y.o. (May 31, 1950) female who presents with chief complaint of  Chief Complaint  Patient presents with   Follow-up    Ultrasound follow up  .  History of Present Illness: Patient returns today in follow up of leg pain.  She is very disappointed and her leg discomfort and her lack of mobility.  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  We did noninvasive arterial studies today which were entirely normal with no aortoiliac stenosis by duplex with normal ABIs and waveforms bilaterally.  Current Outpatient Medications  Medication Sig Dispense Refill   acetaminophen (TYLENOL) 325 MG tablet Take 650 mg by mouth every 6 (six) hours as needed for moderate pain or headache.     B Complex-C (B-COMPLEX WITH VITAMIN C) tablet Take 1 tablet by mouth daily.      Calcium Carbonate-Vit D-Min (CALTRATE 600+D PLUS PO) Take by mouth 2 (two) times daily with a meal.     celecoxib (CELEBREX) 200 MG capsule TAKE 1 CAPSULE (200 MG TOTAL) BY MOUTH 2 (TWO) TIMES A DAY.     cyclobenzaprine (FLEXERIL) 10 MG tablet Take 10 mg by mouth at bedtime.     diclofenac sodium (VOLTAREN) 1 % GEL Apply 2 g topically 4 (four) times daily.      diltiazem (CARDIZEM CD) 240 MG 24 hr capsule Take 240 mg by mouth daily.     ezetimibe (ZETIA) 10 MG tablet Take 10 mg by mouth daily.     fluorouracil (EFUDEX) 5 % cream Apply to Tops of hands, top of elbows  twice a day for 7 days,Apply to the full nose and right lateral cheek  twice a day for 4 days 60 g 2   fluticasone (FLONASE) 50 MCG/ACT nasal spray Place 1 spray into both nostrils daily as needed for allergies or rhinitis.      hydrochlorothiazide (HYDRODIURIL) 25 MG tablet Take by mouth.     magnesium oxide (MAG-OX) 400 MG tablet Take 400 mg by mouth daily.     montelukast (SINGULAIR) 10 MG tablet TAKE ONE TABLET BY MOUTH EVERY NIGHT AT BEDTIME     Multiple Vitamin (MULTIVITAMIN WITH  MINERALS) TABS tablet Take 1 tablet by mouth daily.     mupirocin ointment (BACTROBAN) 2 % Apply 1 application topically daily. 22 g 1   REPATHA SURECLICK [id number] MG/ML SOAJ Inject into the skin every 14 (fourteen) days.     valACYclovir (VALTREX) 1000 MG tablet Take 2 tablets in the morning and 2 tablets in the evening for one day, as needed for flare     vitamin B-12 (CYANOCOBALAMIN) 1000 MCG tablet Take 1,000 mcg by mouth daily.      Zinc Citrate-Phytase 25-500 MG CAPS Take by mouth daily.     Alirocumab (PRALUENT) 150 MG/ML SOAJ Inject into the skin. (Patient not taking: Reported on 07/03/2022)     No current facility-administered medications for this visit.    Past Medical History:  Diagnosis Date   Anemia    Anxiety    Arthritis    Basal cell carcinoma 10/24/2020   right nasal tip, Moh's 01/09/2021   BCC (basal cell carcinoma) 11/26/2021   Left lateral pretibia. EDC 12/24/2021   Cancer (HCC)    skin   CHF (congestive heart failure) (HCC)    GERD (gastroesophageal reflux disease)    Headache    Heart murmur    Hx  of dysplastic nevus 10/15/2009   R dorsum 2nd toe, mild atypia   Hx of dysplastic nevus 11/07/2012   R mid anterior thigh, modterate atypia   Hyperlipemia    Hypothyroidism    Pneumonia    PONV (postoperative nausea and vomiting)    Squamous cell carcinoma of skin 09/15/2006   L forearm, excised   Squamous cell carcinoma of skin 03/21/2008   R lower leg, SCCIS excised   Squamous cell carcinoma of skin 07/19/2008   R forearm   Squamous cell carcinoma of skin 10/25/2012   L mid dorsum forearm   Squamous cell carcinoma of skin 01/24/2014   L posterior lower leg    Past Surgical History:  Procedure Laterality Date   ANTERIOR CERVICAL DECOMP/DISCECTOMY FUSION N/A 09/18/2015   Procedure: ANTERIOR CERVICAL DECOMPRESSION/DISCECTOMY INTERBODY FUSION PLATING BONEGRAFT CERVICAL FIVE-SIX ,CERVICAL SIX-SEVEN ;  Surgeon: Newman Pies, MD;  Location: [place] [facility];  Service:  Neurosurgery;  Laterality: N/A;   APPENDECTOMY     both shoulder surgery     BREAST EXCISIONAL BIOPSY Right    @ 2000 2 areas excised   CHOLECYSTECTOMY     COLONOSCOPY     COLONOSCOPY WITH PROPOFOL N/A 12/27/2019   Procedure: COLONOSCOPY WITH PROPOFOL;  Surgeon: Robert Bellow, MD;  Location: [facility];  Service: Endoscopy;  Laterality: N/A;   EYE SURGERY     NEUROPLASTY / TRANSPOSITION ULNAR NERVE AT ELBOW  02/1996   OOPHORECTOMY     right knee surgery     skin cancer removed     TONSILLECTOMY       Social History   Tobacco Use   Smoking status: Never   Smokeless tobacco: Never  Vaping Use   Vaping Use: Never used  Substance Use Topics   Alcohol use: Yes    Comment: occcassionally    Drug use: No       Family History  Problem Relation Age of Onset   Pancreatic cancer Mother    Lung cancer Father    Pancreatic cancer Maternal Uncle    Pancreatic cancer Brother    Melanoma Maternal Grandfather    Breast cancer Neg Hx      Allergies  Allergen Reactions   Fentanyl Nausea And Vomiting   Codeine Other (See Comments)    Dizziness, nausea, vomiting   Gabapentin Other (See Comments)   Hydrocodone Nausea And Vomiting   Lovastatin Other (See Comments)    Muscle pain   Morphine Hives   Tramadol Nausea And Vomiting     REVIEW OF SYSTEMS (Negative unless checked)  Constitutional: '[]'$ Weight loss  '[]'$ Fever  '[]'$ Chills Cardiac: '[]'$ Chest pain   '[]'$ Chest pressure   '[]'$ Palpitations   '[]'$ Shortness of breath when laying flat   '[]'$ Shortness of breath at rest   '[]'$ Shortness of breath with exertion. Vascular:  '[x]'$ Pain in legs with walking   '[]'$ Pain in legs at rest   '[]'$ Pain in legs when laying flat   '[]'$ Claudication   '[]'$ Pain in feet when walking  '[]'$ Pain in feet at rest  '[]'$ Pain in feet when laying flat   '[]'$ History of DVT   '[]'$ Phlebitis   '[x]'$ Swelling in legs   '[x]'$ Varicose veins   '[]'$ Non-healing ulcers Pulmonary:   '[]'$ Uses home oxygen   '[]'$ Productive cough   '[]'$ Hemoptysis   '[]'$ Wheeze   '[]'$ COPD   '[]'$ Asthma Neurologic:  '[]'$ Dizziness  '[]'$ Blackouts   '[]'$ Seizures   '[]'$ History of stroke   '[]'$ History of TIA  '[]'$ Aphasia   '[]'$ Temporary blindness   '[]'$ Dysphagia   '[]'$ Weakness or  numbness in arms   '[]'$ Weakness or numbness in legs Musculoskeletal:  '[x]'$ Arthritis   '[]'$ Joint swelling   '[x]'$ Joint pain   '[]'$ Low back pain Hematologic:  '[]'$ Easy bruising  '[]'$ Easy bleeding   '[]'$ Hypercoagulable state   '[]'$ Anemic   Gastrointestinal:  '[]'$ Blood in stool   '[]'$ Vomiting blood  '[]'$ Gastroesophageal reflux/heartburn   '[]'$ Abdominal pain Genitourinary:  '[]'$ Chronic kidney disease   '[]'$ Difficult urination  '[]'$ Frequent urination  '[]'$ Burning with urination   '[]'$ Hematuria Skin:  '[]'$ Rashes   '[]'$ Ulcers   '[]'$ Wounds Psychological:  '[x]'$ History of anxiety   '[]'$  History of major depression.  Physical Examination  BP 133/73 (BP Location: Left Arm)   Pulse 68   Resp 16   Wt 147 lb 3.2 oz (66.8 kg)   BMI 25.27 kg/m  Gen:  WD/WN, NAD. Appears younger than stated age. Head: [place]/AT, No temporalis wasting. Ear/Nose/Throat: Hearing grossly intact, nares w/o erythema or drainage Eyes: Conjunctiva clear. Sclera non-icteric Neck: Supple.  Trachea midline Pulmonary:  Good air movement, no use of accessory muscles.  Cardiac: RRR, no JVD Vascular: Diffuse varicosities present bilaterally Vessel Right Left  Radial Palpable Palpable                          PT Palpable Palpable  DP Palpable Palpable   Gastrointestinal: soft, non-tender/non-distended. No guarding/reflex.  Musculoskeletal: M/S 5/5 throughout.  No deformity or atrophy.  Trace bilateral lower extremity edema. Neurologic: Sensation grossly intact in extremities.  Symmetrical.  Speech is fluent.  Psychiatric: Judgment intact, Mood & affect appropriate for pt's clinical situation. Dermatologic: No rashes or ulcers noted.  No cellulitis or open wounds.      Labs No results found for this or any previous visit (from the past 2160 hour(s)).  Radiology No results  found.  Assessment/Plan  Varicose veins of bilateral lower extremities with pain The patient has a previous reflux study described in her last note.  She has attempted wearing compression socks, elevating her legs, and remained as active as possible for many years.  Despite this, she has persistent pain, heaviness, and prominent painful varicosities in both lower extremities.  Laser ablation of the saphenous veins are planned and pending at this point.  Pain in limb We did noninvasive arterial studies today which were entirely normal with no aortoiliac stenosis by duplex with normal ABIs and waveforms bilaterally.  Likely component of pain from venous disease as well as neuropathic pain or musculoskeletal issues.    Leotis Pain, MD  07/03/2022 10:22 AM    This note was created with Dragon medical transcription system.  Any errors from dictation are purely unintentional

## 2022-07-06 LAB — VAS US ABI WITH/WO TBI
Left ABI: 1.2
Right ABI: 1.27

## 2022-07-10 ENCOUNTER — Other Ambulatory Visit: Payer: Self-pay | Admitting: Internal Medicine

## 2022-07-10 DIAGNOSIS — R0789 Other chest pain: Secondary | ICD-10-CM

## 2022-07-15 ENCOUNTER — Telehealth (HOSPITAL_COMMUNITY): Payer: Self-pay | Admitting: Emergency Medicine

## 2022-07-15 ENCOUNTER — Other Ambulatory Visit: Payer: Self-pay | Admitting: Internal Medicine

## 2022-07-15 DIAGNOSIS — E785 Hyperlipidemia, unspecified: Secondary | ICD-10-CM

## 2022-07-15 DIAGNOSIS — R0789 Other chest pain: Secondary | ICD-10-CM

## 2022-07-15 DIAGNOSIS — R079 Chest pain, unspecified: Secondary | ICD-10-CM

## 2022-07-15 DIAGNOSIS — R0602 Shortness of breath: Secondary | ICD-10-CM

## 2022-07-15 DIAGNOSIS — I4729 Other ventricular tachycardia: Secondary | ICD-10-CM

## 2022-07-15 DIAGNOSIS — I700 Atherosclerosis of aorta: Secondary | ICD-10-CM

## 2022-07-15 DIAGNOSIS — I10 Essential (primary) hypertension: Secondary | ICD-10-CM

## 2022-07-15 MED ORDER — METOPROLOL TARTRATE 100 MG PO TABS: 100.0000 mg | ORAL_TABLET | Freq: Once | ORAL | 0 refills | Status: DC

## 2022-07-15 NOTE — Telephone Encounter (Signed)
Attempted to call patient regarding upcoming cardiac CT appointment. °Left message on voicemail with name and callback number °Ettie Krontz RN Navigator Cardiac Imaging °[facility] °[phone] Office °[phone] Cell ° °

## 2022-07-15 NOTE — Telephone Encounter (Signed)
Reaching out to patient to offer assistance regarding upcoming cardiac imaging study; pt verbalizes understanding of appt date/time, parking situation and where to check in, pre-test NPO status and medications ordered, and verified current allergies; name and call back number provided for further questions should they arise Marchia Bond RN Navigator Cardiac Imaging [facility] [phone] office [phone] cell  Arrival 230 [facility] '100mg'$  metoprolol tartrate  Denies iv issues Aware contrast / nitro

## 2022-07-16 ENCOUNTER — Ambulatory Visit
Admission: RE | Admit: 2022-07-16 | Discharge: 2022-07-16 | Disposition: A | Discharge status: Home / Self Care | Payer: Medicare Other | Source: Ambulatory Visit | Attending: Internal Medicine | Admitting: Internal Medicine

## 2022-07-16 DIAGNOSIS — R0602 Shortness of breath: Secondary | ICD-10-CM | POA: Diagnosis present

## 2022-07-16 DIAGNOSIS — I700 Atherosclerosis of aorta: Secondary | ICD-10-CM

## 2022-07-16 DIAGNOSIS — E785 Hyperlipidemia, unspecified: Secondary | ICD-10-CM | POA: Diagnosis present

## 2022-07-16 DIAGNOSIS — R0789 Other chest pain: Secondary | ICD-10-CM

## 2022-07-16 MED ORDER — NITROGLYCERIN 0.4 MG SL SUBL
0.8000 mg | SUBLINGUAL_TABLET | Freq: Once | SUBLINGUAL | Status: AC
  Administered 2022-07-16: 0.8 mg via SUBLINGUAL

## 2022-07-16 MED ORDER — METOPROLOL TARTRATE 5 MG/5ML IV SOLN
10.0000 mg | Freq: Once | INTRAVENOUS | Status: AC
  Administered 2022-07-16: 10 mg via INTRAVENOUS

## 2022-07-16 MED ORDER — IOHEXOL 350 MG/ML SOLN
75.0000 mL | Freq: Once | INTRAVENOUS | Status: AC | PRN
  Administered 2022-07-16: 75 mL via INTRAVENOUS

## 2022-07-16 NOTE — Progress Notes (Signed)
Patient tolerated procedure well. Ambulate w/o difficulty. Denies light headedness or being dizzy. Sitting in chair drinking water provided. Encouraged to drink extra water today and reasoning explained. Verbalized understanding. All questions answered. ABC intact. No further needs. Discharge from procedure area w/o issues.   °

## 2022-07-30 ENCOUNTER — Ambulatory Visit: Admission: RE | Admit: 2022-07-30 | Payer: Medicare Other | Source: Ambulatory Visit

## 2022-08-18 ENCOUNTER — Other Ambulatory Visit: Payer: Self-pay | Admitting: Internal Medicine

## 2022-08-18 DIAGNOSIS — R1900 Intra-abdominal and pelvic swelling, mass and lump, unspecified site: Secondary | ICD-10-CM

## 2022-08-19 ENCOUNTER — Ambulatory Visit (INDEPENDENT_AMBULATORY_CARE_PROVIDER_SITE_OTHER): Payer: Medicare Other | Admitting: Dermatology

## 2022-08-19 ENCOUNTER — Encounter: Payer: Self-pay | Admitting: Dermatology

## 2022-08-19 VITALS — BP 124/68

## 2022-08-19 DIAGNOSIS — I781 Nevus, non-neoplastic: Secondary | ICD-10-CM

## 2022-08-19 DIAGNOSIS — L570 Actinic keratosis: Secondary | ICD-10-CM | POA: Diagnosis not present

## 2022-08-19 DIAGNOSIS — B079 Viral wart, unspecified: Secondary | ICD-10-CM | POA: Diagnosis not present

## 2022-08-19 DIAGNOSIS — D485 Neoplasm of uncertain behavior of skin: Secondary | ICD-10-CM | POA: Diagnosis not present

## 2022-08-19 DIAGNOSIS — L814 Other melanin hyperpigmentation: Secondary | ICD-10-CM

## 2022-08-19 DIAGNOSIS — L578 Other skin changes due to chronic exposure to nonionizing radiation: Secondary | ICD-10-CM

## 2022-08-19 DIAGNOSIS — L821 Other seborrheic keratosis: Secondary | ICD-10-CM

## 2022-08-19 DIAGNOSIS — L720 Epidermal cyst: Secondary | ICD-10-CM

## 2022-08-19 NOTE — Progress Notes (Signed)
Follow-Up Visit   Subjective  Taylor Hunt is a 72 y.o. female who presents for the following: Actinic keratosis  At right distal pretibial treated with LN2. Tops of hands, elbow, nose and right lateral cheek treated with 5FU/calcipotriene. Patient with spots at left forehead, right side of nose, left side of face. A few scaly spots at arms.    The following portions of the chart were reviewed this encounter and updated as appropriate: medications, allergies, medical history  Review of Systems:  No other skin or systemic complaints except as noted in HPI or Assessment and Plan.  Objective  Well appearing patient in no apparent distress; mood and affect are within normal limits.  A focused examination was performed of the following areas: Legs, arms, face  Relevant exam findings are noted in the Assessment and Plan.  Left Ear Dilated blood vessel without features suspicious for malignancy on dermoscopy  Right Lower Eyelid 0.25 cm translucent papule without features suspicious for malignancy on dermoscopy and without disruption of lash margin     Assessment & Plan   Telangiectasia Left Ear  Benign-appearing.  Observation.  Call clinic for new or changing lesions.    Recommend daily broad spectrum sunscreen SPF 30+ to sun-exposed areas, reapply every 2 hours as needed. Call for new or changing lesions.  Staying in the shade or wearing long sleeves, sun glasses (UVA+UVB protection) and wide brim hats (4-inch brim around the entire circumference of the hat) are also recommended for sun protection.    Neoplasm of uncertain behavior of skin Right Lower Eyelid  Favor benign cyst Call for changes, recheck at f/u     ACTINIC KERATOSIS  Exam: Erythematous thin papules/macules with gritty scale  Actinic keratoses are precancerous spots that appear secondary to cumulative UV radiation exposure/sun exposure over time. They are chronic with expected duration over 1 year. A  portion of actinic keratoses will progress to squamous cell carcinoma of the skin. It is not possible to reliably predict which spots will progress to skin cancer and so treatment is recommended to prevent development of skin cancer.  Recommend daily broad spectrum sunscreen SPF 30+ to sun-exposed areas, reapply every 2 hours as needed.  Recommend staying in the shade or wearing long sleeves, sun glasses (UVA+UVB protection) and wide brim hats (4-inch brim around the entire circumference of the hat). Call for new or changing lesions.  Treatment Plan: Destruction Procedure Note Destruction method: cryotherapy   Informed consent: discussed and consent obtained   Lesion destroyed using liquid nitrogen: Yes   Outcome: patient tolerated procedure well with no complications   Post-procedure details: wound care instructions given   Locations: R distal pretibial x 2, R post calf x 1, L post calf x 1, L dorsal hand x 1, L forearm x 4, R forearm x 3 # of Lesions Treated: 12  Prior to procedure, discussed risks of blister formation, small wound, skin dyspigmentation, or rare scar following cryotherapy. Recommend Vaseline ointment to treated areas while healing.  SEBORRHEIC KERATOSIS - Stuck-on, waxy, tan-brown papules and/or plaques  - Benign-appearing - Discussed benign etiology and prognosis. - Observe - Call for any changes  LENTIGINES Exam: scattered tan macules Due to sun exposure Treatment Plan: Benign-appearing, observe. Recommend daily broad spectrum sunscreen SPF 30+ to sun-exposed areas, reapply every 2 hours as needed.  Call for any changes  Milia - tiny firm white papules - type of cyst - benign - may be extracted if symptomatic - observe  WART  Exam: verrucous papule within SK at left forehead  Discussed viral / HPV (Human Papilloma Virus) etiology and risk of spread /infectivity to other areas of body as well as to other people.  Multiple treatments and methods may be  required to clear warts and it is possible treatment may not be successful.  Treatment risks include discoloration; scarring and there is still potential for wart recurrence.  Treatment Plan: Destruction Procedure Note Destruction method: cryotherapy   Informed consent: discussed and consent obtained   Lesion destroyed using liquid nitrogen: Yes   Outcome: patient tolerated procedure well with no complications   Post-procedure details: wound care instructions given   Locations: L forehead # of Lesions Treated: 1  Prior to procedure, discussed risks of blister formation, small wound, skin dyspigmentation, or rare scar following cryotherapy. Recommend Vaseline ointment to treated areas while healing.  ACTINIC DAMAGE WITH PRECANCEROUS ACTINIC KERATOSES Counseling for Topical Chemotherapy Management: Patient exhibits: - Severe, confluent actinic changes with pre-cancerous actinic keratoses that is secondary to cumulative UV radiation exposure over time - at Right Eyebrow area - Condition that is severe; chronic, not at goal. - diffuse scaly erythematous macules and papules with underlying dyspigmentation - Discussed Prescription "Field Treatment" topical Chemotherapy for Severe, Chronic Confluent Actinic Changes with Pre-Cancerous Actinic Keratoses Field treatment involves treatment of an entire area of skin that has confluent Actinic Changes (Sun/ Ultraviolet light damage) and PreCancerous Actinic Keratoses by method of PhotoDynamic Therapy (PDT) and/or prescription Topical Chemotherapy agents such as 5-fluorouracil, 5-fluorouracil/calcipotriene, and/or imiquimod.  The purpose is to decrease the number of clinically evident and subclinical PreCancerous lesions to prevent progression to development of skin cancer by chemically destroying early precancer changes that may or may not be visible.  It has been shown to reduce the risk of developing skin cancer in the treated area. As a result of  treatment, redness, scaling, crusting, and open sores may occur during treatment course. One or more than one of these methods may be used and may have to be used several times to control, suppress and eliminate the PreCancerous changes. Discussed treatment course, expected reaction, and possible side effects. - Recommend daily broad spectrum sunscreen SPF 30+ to sun-exposed areas, reapply every 2 hours as needed.  - Staying in the shade or wearing long sleeves, sun glasses (UVA+UVB protection) and wide brim hats (4-inch brim around the entire circumference of the hat) are also recommended. - Call for new or changing lesions. - Start 5-fluorouracil/calcipotriene cream twice a day for 4 days to affected areas including R lateral brow. Patient provided with handout reviewing treatment course and side effects and advised to call or message us on MyChart with any concerns.  - Reviewed course of treatment and expected reaction.  Patient advised to expect inflammation and crusting and advised that erosions are possible.  Patient advised to be diligent with sun protection during and after treatment. Counseled to keep medication out of reach of children and pets.   Return in about 6 months (around 02/18/2023) for TBSE, Hx AK, Hx SCC, Hx BCC, Hx Dysplastic Nevi, Hx SCCis.  Anise SalvoI,Jackie Stephens, RMA, am acting as scribe for Darden DatesVIRGINA Jashad Depaula, MD .   Documentation: I have reviewed the above documentation for accuracy and completeness, and I agree with the above.  Darden DatesVIRGINA Madelyne Millikan, MD

## 2022-08-19 NOTE — Patient Instructions (Addendum)
Cryotherapy Aftercare  Wash gently with soap and water everyday.   Apply Vaseline and Band-Aid daily until healed.   - Start 5-fluorouracil/calcipotriene cream twice a day for 4 days to affected areas including R lateral brow. Patient provided with handout reviewing treatment course and side effects and advised to call or message [place] on MyChart with any concerns.  Recommend Niacinamide or Nicotinamide 500mg  twice per day to lower risk of non-melanoma skin cancer by approximately 25%. This is usually available at [employer].  Recommend taking Heliocare sun protection supplement daily in sunny weather for additional sun protection. For maximum protection on the sunniest days, you can take up to 2 capsules of regular Heliocare OR take 1 capsule of Heliocare Ultra. For prolonged exposure (such as a full day in the sun), you can repeat your dose of the supplement 4 hours after your first dose. Heliocare can be purchased at [employer], at some [facility] or at [email or web address].     Due to recent changes in healthcare laws, you may see results of your pathology and/or laboratory studies on MyChart before the doctors have had a chance to review them. We understand that in some cases there may be results that are confusing or concerning to you. Please understand that not all results are received at the same time and often the doctors may need to interpret multiple results in order to provide you with the best plan of care or course of treatment. Therefore, we ask that you please give [place] 2 business days to thoroughly review all your results before contacting the office for clarification. Should we see a critical lab result, you will be contacted sooner.   If You Need Anything After Your Visit  If you have any questions or concerns for your doctor, please call our main line at [phone] and press option 4 to reach your doctor's medical assistant. If no one answers, please leave a voicemail as  directed and we will return your call as soon as possible. Messages left after 4 pm will be answered the following business day.   You may also send [place] a message via MyChart. We typically respond to MyChart messages within 1-2 business days.  For prescription refills, please ask your pharmacy to contact our office. Our fax number is [phone].  If you have an urgent issue when the clinic is closed that cannot wait until the next business day, you can page your doctor at the number below.    Please note that while we do our best to be available for urgent issues outside of office hours, we are not available 24/7.   If you have an urgent issue and are unable to reach [place], you may choose to seek medical care at your doctor's office, retail clinic, urgent care center, or emergency room.  If you have a medical emergency, please immediately call 911 or go to the emergency department.  Pager Numbers  - Dr. Gwen Pounds: [phone]  - Dr. Neale Burly: [phone]  - Dr. Roseanne Reno: [phone]  In the event of inclement weather, please call our main line at [phone] for an update on the status of any delays or closures.  Dermatology Medication Tips: Please keep the boxes that topical medications come in in order to help keep track of the instructions about where and how to use these. Pharmacies typically print the medication instructions only on the boxes and not directly on the medication tubes.   If your medication is too expensive, please contact our office  at [phone] option 4 or send [place] a message through MyChart.   We are unable to tell what your co-pay for medications will be in advance as this is different depending on your insurance coverage. However, we may be able to find a substitute medication at lower cost or fill out paperwork to get insurance to cover a needed medication.   If a prior authorization is required to get your medication covered by your insurance company, please  allow [place] 1-2 business days to complete this process.  Drug prices often vary depending on where the prescription is filled and some pharmacies may offer cheaper prices.  The website www.goodrx.com contains coupons for medications through different pharmacies. The prices here do not account for what the cost may be with help from insurance (it may be cheaper with your insurance), but the website can give you the price if you did not use any insurance.  - You can print the associated coupon and take it with your prescription to the pharmacy.  - You may also stop by our office during regular business hours and pick up a GoodRx coupon card.  - If you need your prescription sent electronically to a different pharmacy, notify our office through [facility] or by phone at [phone] option 4.     Si Usted Necesita Algo Despus de Su Visita  Tambin puede enviarnos un mensaje a travs de Clinical cytogeneticist. Por lo general respondemos a los mensajes de MyChart en el transcurso de 1 a 2 das hbiles.  Para renovar recetas, por favor pida a su farmacia que se ponga en contacto con nuestra oficina. Annie Sable de fax es [place] [phone].  Si tiene un asunto urgente cuando la clnica est cerrada y que no puede esperar hasta el siguiente da hbil, puede llamar/localizar a su doctor(a) al nmero que aparece a continuacin.   Por favor, tenga en cuenta que aunque hacemos todo lo posible para estar disponibles para asuntos urgentes fuera del horario de [place], no estamos disponibles las 24 horas del da, los 7 [address]  [address] de la [place].   Si tiene un problema urgente y no puede comunicarse con nosotros, puede optar por buscar atencin mdica  en el consultorio de su doctor(a), en una clnica privada, en un centro de atencin urgente o en una sala de emergencias.  Si tiene Engineer, drilling, por favor llame inmediatamente al 911 o vaya a la sala de emergencias.  Nmeros de bper  - Dr. Gwen Pounds:  [phone]  - Dra. Moye: [phone]  - Dra. Roseanne Reno: [phone]  En caso de inclemencias del [place], por favor llame a Lacy Duverney principal al [phone] para una actualizacin sobre el [place] de cualquier retraso o cierre.  Consejos para la medicacin en dermatologa: Por favor, guarde las cajas en las que vienen los medicamentos de uso tpico para ayudarle a seguir las instrucciones sobre dnde y cmo usarlos. Las farmacias generalmente imprimen las instrucciones del medicamento slo en las cajas y no directamente en los tubos del [place].   Si su medicamento es muy caro, por favor, pngase en contacto con Rolm Gala llamando al [phone] y presione la opcin 4 o envenos un mensaje a travs de Clinical cytogeneticist.   No podemos decirle cul ser su copago por los medicamentos por adelantado ya que esto es diferente dependiendo de la cobertura de su seguro. Sin embargo, es posible que podamos encontrar un medicamento sustituto a Audiological scientist un formulario para que el seguro cubra el medicamento que se  considera necesario.   Si se requiere una autorizacin previa para que su compaa de seguros [place] su medicamento, por favor permtanos de 1 a 2 das hbiles para completar [address].  Los precios de los medicamentos varan con frecuencia dependiendo del Environmental consultantlugar de dnde se surte la receta y alguna farmacias pueden ofrecer precios ms baratos.  El sitio web www.goodrx.com tiene cupones para medicamentos de Health and safety inspectordiferentes farmacias. Los precios aqu no tienen en cuenta lo que podra costar con la ayuda del seguro (puede ser ms barato con su seguro), pero el sitio web puede darle el precio si no utiliz Tourist information centre managerningn seguro.  - Puede imprimir el cupn correspondiente y llevarlo con su receta a la farmacia.  - Tambin puede pasar por nuestra oficina durante el horario de atencin regular y Education officer, museumrecoger una tarjeta de cupones de GoodRx.  - Si necesita que su receta se enve electrnicamente a  una farmacia diferente, informe a nuestra oficina a travs de MyChart de [place] o por telfono llamando al [phone] y presione la opcin 4.

## 2022-08-20 ENCOUNTER — Ambulatory Visit
Admission: RE | Admit: 2022-08-20 | Discharge: 2022-08-20 | Disposition: A | Discharge status: Home / Self Care | Payer: Medicare Other | Source: Ambulatory Visit | Attending: Internal Medicine | Admitting: Internal Medicine

## 2022-08-20 DIAGNOSIS — R1900 Intra-abdominal and pelvic swelling, mass and lump, unspecified site: Secondary | ICD-10-CM | POA: Insufficient documentation

## 2022-08-20 MED ORDER — GADOBUTROL 1 MMOL/ML IV SOLN
7.5000 mL | Freq: Once | INTRAVENOUS | Status: AC | PRN
  Administered 2022-08-20: 7.5 mL via INTRAVENOUS

## 2022-09-28 ENCOUNTER — Encounter: Payer: Self-pay | Admitting: Dermatology

## 2022-09-30 ENCOUNTER — Other Ambulatory Visit: Payer: Self-pay

## 2022-09-30 MED ORDER — TRIAMCINOLONE ACETONIDE 0.025 % EX OINT: TOPICAL_OINTMENT | CUTANEOUS | 0 refills | Status: AC

## 2022-09-30 NOTE — Telephone Encounter (Signed)
Please send triamcinolone 0.025% ointment to use twice a day up to 5 days. Long-term use can thin the skin. Thank you!

## 2022-09-30 NOTE — Progress Notes (Signed)
Start triamcinolone 0.025% ointment to use twice a day up to 5 days. Long-term use can thin the skin

## 2022-11-15 ENCOUNTER — Emergency Department: Payer: Medicare Other

## 2022-11-15 ENCOUNTER — Other Ambulatory Visit: Payer: Self-pay

## 2022-11-15 ENCOUNTER — Emergency Department
Admission: EM | Admit: 2022-11-15 | Discharge: 2022-11-15 | Disposition: A | Discharge status: Home / Self Care | Payer: Medicare Other | Attending: Emergency Medicine | Admitting: Emergency Medicine

## 2022-11-15 DIAGNOSIS — E039 Hypothyroidism, unspecified: Secondary | ICD-10-CM | POA: Insufficient documentation

## 2022-11-15 DIAGNOSIS — Y92030 Kitchen in apartment as the place of occurrence of the external cause: Secondary | ICD-10-CM | POA: Insufficient documentation

## 2022-11-15 DIAGNOSIS — S8002XA Contusion of left knee, initial encounter: Secondary | ICD-10-CM | POA: Insufficient documentation

## 2022-11-15 DIAGNOSIS — W01198A Fall on same level from slipping, tripping and stumbling with subsequent striking against other object, initial encounter: Secondary | ICD-10-CM | POA: Insufficient documentation

## 2022-11-15 DIAGNOSIS — S52502A Unspecified fracture of the lower end of left radius, initial encounter for closed fracture: Secondary | ICD-10-CM | POA: Diagnosis not present

## 2022-11-15 DIAGNOSIS — I10 Essential (primary) hypertension: Secondary | ICD-10-CM | POA: Diagnosis not present

## 2022-11-15 DIAGNOSIS — S6992XA Unspecified injury of left wrist, hand and finger(s), initial encounter: Secondary | ICD-10-CM | POA: Diagnosis present

## 2022-11-15 DIAGNOSIS — S0990XA Unspecified injury of head, initial encounter: Secondary | ICD-10-CM

## 2022-11-15 DIAGNOSIS — Z23 Encounter for immunization: Secondary | ICD-10-CM | POA: Insufficient documentation

## 2022-11-15 DIAGNOSIS — S51012A Laceration without foreign body of left elbow, initial encounter: Secondary | ICD-10-CM | POA: Insufficient documentation

## 2022-11-15 DIAGNOSIS — S0181XA Laceration without foreign body of other part of head, initial encounter: Secondary | ICD-10-CM | POA: Insufficient documentation

## 2022-11-15 LAB — CBC
HCT: 41.5 % (ref 36.0–46.0)
Hemoglobin: 13.1 g/dL (ref 12.0–15.0)
MCH: 27.5 pg (ref 26.0–34.0)
MCHC: 31.6 g/dL (ref 30.0–36.0)
MCV: 87 fL (ref 80.0–100.0)
Platelets: 421 10*3/uL — ABNORMAL HIGH (ref 150–400)
RBC: 4.77 MIL/uL (ref 3.87–5.11)
RDW: 16.6 % — ABNORMAL HIGH (ref 11.5–15.5)
WBC: 6.5 10*3/uL (ref 4.0–10.5)
nRBC: 0 % (ref 0.0–0.2)

## 2022-11-15 LAB — BASIC METABOLIC PANEL
Anion gap: 10 (ref 5–15)
BUN: 21 mg/dL (ref 8–23)
CO2: 23 mmol/L (ref 22–32)
Calcium: 9.6 mg/dL (ref 8.9–10.3)
Chloride: 107 mmol/L (ref 98–111)
Creatinine, Ser: 0.72 mg/dL (ref 0.44–1.00)
GFR, Estimated: >60 mL/min (ref >60)
Glucose, Bld: 117 mg/dL — ABNORMAL HIGH (ref 70–99)
Potassium: 3.8 mmol/L (ref 3.5–5.1)
Sodium: 140 mmol/L (ref 135–145)

## 2022-11-15 MED ORDER — HYDROCODONE-ACETAMINOPHEN 5-325 MG PO TABS
1.0000 | ORAL_TABLET | Freq: Once | ORAL | Status: AC
  Administered 2022-11-15: 1 via ORAL
  Filled 2022-11-15: qty 1

## 2022-11-15 MED ORDER — HYDROMORPHONE HCL 1 MG/ML IJ SOLN
0.5000 mg | Freq: Once | INTRAMUSCULAR | Status: AC
  Administered 2022-11-15: 0.5 mg via INTRAVENOUS
  Filled 2022-11-15: qty 0.5

## 2022-11-15 MED ORDER — ONDANSETRON HCL 4 MG/2ML IJ SOLN
4.0000 mg | Freq: Once | INTRAMUSCULAR | Status: AC
  Administered 2022-11-15: 4 mg via INTRAVENOUS
  Filled 2022-11-15: qty 2

## 2022-11-15 MED ORDER — TETANUS-DIPHTH-ACELL PERTUSSIS 5-2.5-18.5 LF-MCG/0.5 IM SUSY
0.5000 mL | PREFILLED_SYRINGE | Freq: Once | INTRAMUSCULAR | Status: AC
  Administered 2022-11-15: 0.5 mL via INTRAMUSCULAR
  Filled 2022-11-15: qty 0.5

## 2022-11-15 MED ORDER — HYDROCODONE-ACETAMINOPHEN 5-325 MG PO TABS: 1.0000 | ORAL_TABLET | Freq: Three times a day (TID) | ORAL | 0 refills | Status: AC | PRN

## 2022-11-15 MED ORDER — ONDANSETRON 4 MG PO TBDP
4.0000 mg | ORAL_TABLET | Freq: Once | ORAL | Status: AC
  Administered 2022-11-15: 4 mg via ORAL
  Filled 2022-11-15: qty 1

## 2022-11-15 MED ORDER — ONDANSETRON 4 MG PO TBDP: 4.0000 mg | ORAL_TABLET | Freq: Three times a day (TID) | ORAL | 0 refills | Status: DC | PRN

## 2022-11-15 MED ORDER — BACITRACIN ZINC 500 UNIT/GM EX OINT
TOPICAL_OINTMENT | Freq: Once | CUTANEOUS | Status: AC
  Administered 2022-11-15: 1 via TOPICAL
  Filled 2022-11-15: qty 0.9

## 2022-11-15 NOTE — ED Notes (Signed)
Pt verbalizes understanding of discharge instructions. Opportunity for questioning and answers were provided. Pt discharged from ED to home with friend.    

## 2022-11-15 NOTE — Discharge Instructions (Signed)
Follow-up with orthopedics this week.  Take Tylenol as needed for pain up to a maximum of 4000 mg daily.  Return to the ER for new, worsening, or persistent severe pain, weakness or numbness in the hand, severe headache, dizziness, vomiting, or any other new or worsening symptoms that concern you.

## 2022-11-15 NOTE — ED Provider Notes (Signed)
[facility] Provider Note    Event Date/Time   First MD Initiated Contact with Patient 11/15/22 1843     (approximate)   History   Fall (Multiple injuries)   HPI  Taylor Hunt is a 72 y.o. female with a history of anemia, hypertension, osteoporosis, hypothyroidism, and GERD who presents with multiple injuries after a mechanical fall.  The patient states that about 2 hours ago she was in her kitchen and had opened her dishwasher.  She then got distracted by tennis match on TV and when she returned to the kitchen she tripped over the open door of the dishwasher causing her to fall and hit her head and elbow.  She did not lose consciousness.  She reports an injury to her left wrist and acute pain there.  She has a skin tear and pain to her left elbow, some mild pain to her left shoulder, pain to the left knee, as well as a small laceration above her left eyebrow.  I reviewed the past medical records per the patient's most recent outpatient encounter was with internal medicine on 6/28 for follow-up of her chronic conditions.   Physical Exam   Triage Vital Signs: ED Triage Vitals  Enc Vitals Group     BP 11/15/22 1643 (!) 146/76     Pulse Rate 11/15/22 1640 88     Resp 11/15/22 1640 20     Temp 11/15/22 1640 98 F (36.7 C)     Temp Source 11/15/22 1640 Oral     SpO2 11/15/22 1640 98 %     Weight 11/15/22 1641 145 lb 8.1 oz (66 kg)     Height 11/15/22 1641 5\' 4"  (1.626 m)     Head Circumference --      Peak Flow --      Pain Score 11/15/22 1641 10     Pain Loc --      Pain Edu? --      Excl. in GC? --     Most recent vital signs: Vitals:   11/15/22 1640 11/15/22 1643  BP:  (!) 146/76  Pulse: 88   Resp: 20   Temp: 98 F (36.7 C)   SpO2: 98%      General: Awake, no distress.  CV:  Good peripheral perfusion.  Resp:  Normal effort.  Abd:  No distention.  Other:  EOMI.  PERRLA.  Motor intact in all extremities.  Normal coordination and speech.   No midline spinal tenderness.  Left thoracic and lumbar paraspinal tenderness.  2 cm superficial laceration to the left forehead above the eyebrow.  Left wrist with no deformity.  2+ radial pulse.  Normal cap refill.  Motor and sensory intact in median, radial, and ulnar distributions of the left hand.  Full range of motion at the elbow.  Approximately 5 cm rectangular skin tear to the lateral arm just above the elbow.  Full range of motion of the left knee.   ED Results / Procedures / Treatments   Labs (all labs ordered are listed, but only abnormal results are displayed) Labs Reviewed  CBC - Abnormal; Notable for the following components:      Result Value   RDW 16.6 (*)    Platelets 421 (*)    All other components within normal limits  BASIC METABOLIC PANEL - Abnormal; Notable for the following components:   Glucose, Bld 117 (*)    All other components within normal limits  EKG     RADIOLOGY  XR L wrist: I independently viewed and interpreted the images; there is a distal radius fracture with no significant displacement or angulation as well as an ulnar styloid fracture. XR L shoulder: No acute fracture XR L elbow: No acute fracture XR L knee: No acute fracture CT head: No ICH or other acute abnormality CT cervical spine: No acute fracture   PROCEDURES:  Critical Care performed: No  ..Laceration Repair  Date/Time: 11/15/2022 7:36 PM  Performed by: Dionne Bucy, MD Authorized by: Dionne Bucy, MD   Consent:    Consent obtained:  Verbal   Consent given by:  Patient Anesthesia:    Anesthesia method:  None Laceration details:    Location:  Face   Face location:  Forehead   Length (cm):  2   Depth (mm):  1 Treatment:    Amount of cleaning:  Standard Skin repair:    Repair method:  Tissue adhesive Approximation:    Approximation:  Close Repair type:    Repair type:  Simple Post-procedure details:    Dressing:  Open (no dressing)   Procedure  completion:  Tolerated well, no immediate complications    MEDICATIONS ORDERED IN ED: Medications  bacitracin ointment (1 Application Topical Given 11/15/22 1927)  HYDROmorphone (DILAUDID) injection 0.5 mg (0.5 mg Intravenous Given 11/15/22 1927)  ondansetron (ZOFRAN) injection 4 mg (4 mg Intravenous Given 11/15/22 1927)  Tdap (BOOSTRIX) injection 0.5 mL (0.5 mLs Intramuscular Given 11/15/22 2003)     IMPRESSION / MDM / ASSESSMENT AND PLAN / ED COURSE  I reviewed the triage vital signs and the nursing notes.  72 year old female with PMH as noted above presents after mechanical fall from standing height while in her kitchen.  Differential diagnosis includes, but is not limited to:  1.  Minor head injury.  CT head is negative. 2.  Forehead laceration.  Will repair with Dermabond. 3.  Left wrist fracture: I consulted and discussed the case with Dr. Joice Lofts from orthopedics who recommends a sugar-tong splint and orthopedic follow-up. 4.  Left elbow: Contusion.  X-ray is negative.  Skin tear does not require lacerations.  We will place bacitracin and a sterile dressing. 5.  Left knee: Contusion.  X-rays negative.  Patient will also get a tetanus shot.  Patient's presentation is most consistent with acute complicated illness / injury requiring diagnostic workup.  ----------------------------------------- 8:05 PM on 11/15/2022 -----------------------------------------  Forehead laceration has been repaired.  ED tech will place a sugar-tong splint.  The patient is stable for discharge home.  I counseled her on the results of the imaging and the plan of care.  The patient reports severe GI symptoms with any opiate analgesics and declines a prescription.  She will take Tylenol.  I gave her strict return precautions and she expressed understanding.   FINAL CLINICAL IMPRESSION(S) / ED DIAGNOSES   Final diagnoses:  Closed fracture of distal end of left radius, unspecified fracture morphology,  initial encounter  Laceration of forehead, initial encounter  Skin tear of left elbow without complication, initial encounter  Contusion of left knee, initial encounter  Minor head injury, initial encounter     Rx / DC Orders   ED Discharge Orders     None        Note:  This document was prepared using Dragon voice recognition software and may include unintentional dictation errors.    Dionne Bucy, MD 11/15/22 2005

## 2022-11-15 NOTE — ED Triage Notes (Signed)
Pt to ed from home via [facility] for a fall. Pt did hit her head has laceration to head, left elbow, left shoulder, left knee and left wrist pain. Pt denies LOC, denies thinners. Pt tripped over the dishwasher door and fell. Pt is in a sling by EMS. Pt is caox4, in no acute distress and in wheel chair in triage.  EMS vitals WNL.

## 2022-12-04 ENCOUNTER — Other Ambulatory Visit (INDEPENDENT_AMBULATORY_CARE_PROVIDER_SITE_OTHER): Payer: Medicare Other | Admitting: Vascular Surgery

## 2022-12-11 ENCOUNTER — Encounter (INDEPENDENT_AMBULATORY_CARE_PROVIDER_SITE_OTHER): Payer: Medicare Other

## 2023-02-16 ENCOUNTER — Ambulatory Visit (INDEPENDENT_AMBULATORY_CARE_PROVIDER_SITE_OTHER): Payer: Medicare Other | Admitting: Dermatology

## 2023-02-16 ENCOUNTER — Encounter: Payer: Self-pay | Admitting: Dermatology

## 2023-02-16 VITALS — BP 108/65

## 2023-02-16 DIAGNOSIS — Z79899 Other long term (current) drug therapy: Secondary | ICD-10-CM

## 2023-02-16 DIAGNOSIS — Z872 Personal history of diseases of the skin and subcutaneous tissue: Secondary | ICD-10-CM

## 2023-02-16 DIAGNOSIS — Z1283 Encounter for screening for malignant neoplasm of skin: Secondary | ICD-10-CM

## 2023-02-16 DIAGNOSIS — L578 Other skin changes due to chronic exposure to nonionizing radiation: Secondary | ICD-10-CM | POA: Diagnosis not present

## 2023-02-16 DIAGNOSIS — L570 Actinic keratosis: Secondary | ICD-10-CM | POA: Diagnosis not present

## 2023-02-16 DIAGNOSIS — Z86007 Personal history of in-situ neoplasm of skin: Secondary | ICD-10-CM

## 2023-02-16 DIAGNOSIS — W908XXA Exposure to other nonionizing radiation, initial encounter: Secondary | ICD-10-CM

## 2023-02-16 DIAGNOSIS — L814 Other melanin hyperpigmentation: Secondary | ICD-10-CM

## 2023-02-16 DIAGNOSIS — Z5111 Encounter for antineoplastic chemotherapy: Secondary | ICD-10-CM

## 2023-02-16 DIAGNOSIS — L720 Epidermal cyst: Secondary | ICD-10-CM

## 2023-02-16 DIAGNOSIS — L609 Nail disorder, unspecified: Secondary | ICD-10-CM

## 2023-02-16 DIAGNOSIS — Z7189 Other specified counseling: Secondary | ICD-10-CM

## 2023-02-16 DIAGNOSIS — L821 Other seborrheic keratosis: Secondary | ICD-10-CM

## 2023-02-16 DIAGNOSIS — I781 Nevus, non-neoplastic: Secondary | ICD-10-CM

## 2023-02-16 DIAGNOSIS — Z86018 Personal history of other benign neoplasm: Secondary | ICD-10-CM

## 2023-02-16 DIAGNOSIS — D1801 Hemangioma of skin and subcutaneous tissue: Secondary | ICD-10-CM

## 2023-02-16 DIAGNOSIS — Z85828 Personal history of other malignant neoplasm of skin: Secondary | ICD-10-CM

## 2023-02-16 DIAGNOSIS — D229 Melanocytic nevi, unspecified: Secondary | ICD-10-CM

## 2023-02-16 DIAGNOSIS — Z8589 Personal history of malignant neoplasm of other organs and systems: Secondary | ICD-10-CM

## 2023-02-16 DIAGNOSIS — L650 Telogen effluvium: Secondary | ICD-10-CM

## 2023-02-16 NOTE — Patient Instructions (Addendum)
Rogaine 5% topical for women as directed Biotin 2.5mg  daily  AKLIEF apply a tiny amount to white bumps on face nightly as tolerated  For pre cancers on cheeks  Start 5-fluorouracil/calcipotriene cream twice a day for 7 days to affected areas including cheeks.   5-Fluorouracil/Calcipotriene Patient Education   Actinic keratoses are the dry, red scaly spots on the skin caused by sun damage. A portion of these spots can turn into skin cancer with time, and treating them can help prevent development of skin cancer.   Treatment of these spots requires removal of the defective skin cells. There are various ways to remove actinic keratoses, including freezing with liquid nitrogen, treatment with creams, or treatment with a blue light procedure in the office.   5-fluorouracil cream is a topical cream used to treat actinic keratoses. It works by interfering with the growth of abnormal fast-growing skin cells, such as actinic keratoses. These cells peel off and are replaced by healthy ones.   5-fluorouracil/calcipotriene is a combination of the 5-fluorouracil cream with a vitamin D analog cream called calcipotriene. The calcipotriene alone does not treat actinic keratoses. However, when it is combined with 5-fluorouracil, it helps the 5-fluorouracil treat the actinic keratoses much faster so that the same results can be achieved with a much shorter treatment time.  INSTRUCTIONS FOR 5-FLUOROURACIL/CALCIPOTRIENE CREAM:   5-fluorouracil/calcipotriene cream typically only needs to be used for 4-7 days. A thin layer should be applied twice a day to the treatment areas recommended by your physician.   If your physician prescribed you separate tubes of 5-fluourouracil and calcipotriene, apply a thin layer of 5-fluorouracil followed by a thin layer of calcipotriene.   Avoid contact with your eyes, nostrils, and mouth. Do not use 5-fluorouracil/calcipotriene cream on infected or open wounds.   You will  develop redness, irritation and some crusting at areas where you have pre-cancer damage/actinic keratoses. IF YOU DEVELOP PAIN, BLEEDING, OR SIGNIFICANT CRUSTING, STOP THE TREATMENT EARLY - you have already gotten a good response and the actinic keratoses should clear up well.  Wash your hands after applying 5-fluorouracil 5% cream on your skin.   A moisturizer or sunscreen with a minimum SPF 30 should be applied each morning.   Once you have finished the treatment, you can apply a thin layer of Vaseline twice a day to irritated areas to soothe and calm the areas more quickly. If you experience significant discomfort, contact your physician.  For some patients it is necessary to repeat the treatment for best results.  SIDE EFFECTS: When using 5-fluorouracil/calcipotriene cream, you may have mild irritation, such as redness, dryness, swelling, or a mild burning sensation. This usually resolves within 2 weeks. The more actinic keratoses you have, the more redness and inflammation you can expect during treatment. Eye irritation has been reported rarely. If this occurs, please let us know.  If you have any trouble using this cream, please call the office. If you have any other questions about this information, please do not hesitate to ask me before you leave the office.    Due to recent changes in healthcare laws, you may see results of your pathology and/or laboratory studies on MyChart before the doctors have had a chance to review them. We understand that in some cases there may be results that are confusing or concerning to you. Please understand that not all results are received at the same time and often the doctors may need to interpret multiple results in order to provide you with  the best plan of care or course of treatment. Therefore, we ask that you please give [place] 2 business days to thoroughly review all your results before contacting the office for clarification. Should we see a critical lab  result, you will be contacted sooner.   If You Need Anything After Your Visit  If you have any questions or concerns for your doctor, please call our main line at [phone] and press option 4 to reach your doctor's medical assistant. If no one answers, please leave a voicemail as directed and we will return your call as soon as possible. Messages left after 4 pm will be answered the following business day.   You may also send [place] a message via MyChart. We typically respond to MyChart messages within 1-2 business days.  For prescription refills, please ask your pharmacy to contact our office. Our fax number is [phone].  If you have an urgent issue when the clinic is closed that cannot wait until the next business day, you can page your doctor at the number below.    Please note that while we do our best to be available for urgent issues outside of office hours, we are not available 24/7.   If you have an urgent issue and are unable to reach [place], you may choose to seek medical care at your doctor's office, retail clinic, urgent care center, or emergency room.  If you have a medical emergency, please immediately call 911 or go to the emergency department.  Pager Numbers  - Dr. Gwen Pounds: [phone]  - Dr. Roseanne Reno: [phone]  - Dr. Katrinka Blazing: [phone]   In the event of inclement weather, please call our main line at [phone] for an update on the status of any delays or closures.  Dermatology Medication Tips: Please keep the boxes that topical medications come in in order to help keep track of the instructions about where and how to use these. Pharmacies typically print the medication instructions only on the boxes and not directly on the medication tubes.   If your medication is too expensive, please contact our office at [phone] option 4 or send [place] a message through MyChart.   We are unable to tell what your co-pay for medications will be in advance as this is  different depending on your insurance coverage. However, we may be able to find a substitute medication at lower cost or fill out paperwork to get insurance to cover a needed medication.   If a prior authorization is required to get your medication covered by your insurance company, please allow [place] 1-2 business days to complete this process.  Drug prices often vary depending on where the prescription is filled and some pharmacies may offer cheaper prices.  The website www.goodrx.com contains coupons for medications through different pharmacies. The prices here do not account for what the cost may be with help from insurance (it may be cheaper with your insurance), but the website can give you the price if you did not use any insurance.  - You can print the associated coupon and take it with your prescription to the pharmacy.  - You may also stop by our office during regular business hours and pick up a GoodRx coupon card.  - If you need your prescription sent electronically to a different pharmacy, notify our office through [facility] or by phone at [phone] option 4.     Si Usted Necesita Algo Despus de Su Visita  Tambin puede enviarnos un mensaje a Annette Stable  de MyChart. Por lo general respondemos a los mensajes de MyChart en el transcurso de 1 a 2 das hbiles.  Para renovar recetas, por favor pida a su farmacia que se ponga en contacto con nuestra oficina. Annie Sable de fax es [place] [phone].  Si tiene un asunto urgente cuando la clnica est cerrada y que no puede esperar hasta el siguiente da hbil, puede llamar/localizar a su doctor(a) al nmero que aparece a continuacin.   Por favor, tenga en cuenta que aunque hacemos todo lo posible para estar disponibles para asuntos urgentes fuera del horario de [place], no estamos disponibles las 24 horas del da, los 7 [address]  [address] de la [place].   Si tiene un problema urgente y no puede comunicarse con nosotros, puede optar por buscar  atencin mdica  en el consultorio de su doctor(a), en una clnica privada, en un centro de atencin urgente o en una sala de emergencias.  Si tiene Engineer, drilling, por favor llame inmediatamente al 911 o vaya a la sala de emergencias.  Nmeros de bper  - Dr. Gwen Pounds: [phone]  - Dra. Roseanne Reno: [phone]  - Dr. Katrinka Blazing: [phone]   En caso de inclemencias del tiempo, por favor llame a Lacy Duverney principal al [phone] para una actualizacin sobre el [place] de cualquier retraso o cierre.  Consejos para la medicacin en dermatologa: Por favor, guarde las cajas en las que vienen los medicamentos de uso tpico para ayudarle a seguir las instrucciones sobre dnde y cmo usarlos. Las farmacias generalmente imprimen las instrucciones del medicamento slo en las cajas y no directamente en los tubos del [place].   Si su medicamento es muy caro, por favor, pngase en contacto con Rolm Gala llamando al [phone] y presione la opcin 4 o envenos un mensaje a travs de Clinical cytogeneticist.   No podemos decirle cul ser su copago por los medicamentos por adelantado ya que esto es diferente dependiendo de la cobertura de su seguro. Sin embargo, es posible que podamos encontrar un medicamento sustituto a Audiological scientist un formulario para que el seguro cubra el medicamento que se considera necesario.   Si se requiere una autorizacin previa para que su compaa de seguros [place] su medicamento, por favor permtanos de 1 a 2 das hbiles para completar [address]  Los precios de los medicamentos varan con frecuencia dependiendo del Environmental consultant de dnde se surte la receta y alguna farmacias pueden ofrecer precios ms baratos.  El sitio web www.goodrx.com tiene cupones para medicamentos de Health and safety inspector. Los precios aqu no tienen en cuenta lo que podra costar con la ayuda del seguro (puede ser ms barato con su seguro), pero el sitio web puede darle el precio si no utiliz  Tourist information centre manager.  - Puede imprimir el cupn correspondiente y llevarlo con su receta a la farmacia.  - Tambin puede pasar por nuestra oficina durante el horario de atencin regular y Education officer, museum una tarjeta de cupones de GoodRx.  - Si necesita que su receta se enve electrnicamente a una farmacia diferente, informe a nuestra oficina a travs de MyChart de [place] o por telfono llamando al [phone] y presione la opcin 4.

## 2023-02-16 NOTE — Progress Notes (Signed)
Follow-Up Visit   Subjective  Taylor Hunt is a 72 y.o. female who presents for the following: Skin Cancer Screening and Full Body Skin Exam hx of BCC, SCC, SCC IS, Dysplastic Nevi, Aks, check red spot nose where she had mohs surgery, hair coming out not sure if r/t Methotrexate, pt used 5FU/Calcipotriene in past and had a reaction and would like to know if anything else she could use for scaly spots  The patient presents for Total-Body Skin Exam (TBSE) for skin cancer screening and mole check. The patient has spots, moles and lesions to be evaluated, some may be new or changing and the patient may have concern these could be cancer.    The following portions of the chart were reviewed this encounter and updated as appropriate: medications, allergies, medical history  Review of Systems:  No other skin or systemic complaints except as noted in HPI or Assessment and Plan.  Objective  Well appearing patient in no apparent distress; mood and affect are within normal limits.  A full examination was performed including scalp, head, eyes, ears, nose, lips, neck, chest, axillae, abdomen, back, buttocks, bilateral upper extremities, bilateral lower extremities, hands, feet, fingers, toes, fingernails, and toenails. All findings within normal limits unless otherwise noted below.   Relevant physical exam findings are noted in the Assessment and Plan.    Assessment & Plan   SKIN CANCER SCREENING PERFORMED TODAY.  ACTINIC DAMAGE WITH PRECANCEROUS ACTINIC KERATOSES Counseling for Topical Chemotherapy Management: Patient exhibits: - Severe, confluent actinic changes with pre-cancerous actinic keratoses that is secondary to cumulative UV radiation exposure over time - Condition that is severe; chronic, not at goal. - diffuse scaly erythematous macules and papules with underlying dyspigmentation - Discussed Prescription "Field Treatment" topical Chemotherapy for Severe, Chronic Confluent Actinic  Changes with Pre-Cancerous Actinic Keratoses Field treatment involves treatment of an entire area of skin that has confluent Actinic Changes (Sun/ Ultraviolet light damage) and PreCancerous Actinic Keratoses by method of PhotoDynamic Therapy (PDT) and/or prescription Topical Chemotherapy agents such as 5-fluorouracil, 5-fluorouracil/calcipotriene, and/or imiquimod.  The purpose is to decrease the number of clinically evident and subclinical PreCancerous lesions to prevent progression to development of skin cancer by chemically destroying early precancer changes that may or may not be visible.  It has been shown to reduce the risk of developing skin cancer in the treated area. As a result of treatment, redness, scaling, crusting, and open sores may occur during treatment course. One or more than one of these methods may be used and may have to be used several times to control, suppress and eliminate the PreCancerous changes. Discussed treatment course, expected reaction, and possible side effects. - Recommend daily broad spectrum sunscreen SPF 30+ to sun-exposed areas, reapply every 2 hours as needed.  - Staying in the shade or wearing long sleeves, sun glasses (UVA+UVB protection) and wide brim hats (4-inch brim around the entire circumference of the hat) are also recommended. - Call for new or changing lesions.  -- Start 5-fluorouracil/calcipotriene cream twice a day for 7 days to affected areas including cheeks. Prescription sent to [facility]. Patient advised they will receive an email to purchase the medication online and have it sent to their home. Patient provided with handout reviewing treatment course and side effects and advised to call or message [place] on MyChart with any concerns.  Reviewed course of treatment and expected reaction.  Patient advised to expect inflammation and crusting and advised that erosions are possible.  Patient advised to be diligent with sun protection  during and after treatment. Counseled to keep medication out of reach of children and pets.    LENTIGINES, SEBORRHEIC KERATOSES, HEMANGIOMAS - Benign normal skin lesions - Benign-appearing - Call for any changes  MELANOCYTIC NEVI - Tan-brown and/or pink-flesh-colored symmetric macules and papules - Benign appearing on exam today - Observation - Call clinic for new or changing moles - Recommend daily use of broad spectrum spf 30+ sunscreen to sun-exposed areas.   HISTORY OF BASAL CELL CARCINOMA OF THE SKIN - No evidence of recurrence today - Recommend regular full body skin exams - Recommend daily broad spectrum sunscreen SPF 30+ to sun-exposed areas, reapply every 2 hours as needed.  - Call if any new or changing lesions are noted between office visits  - R nasal tip, L lat pretibia  HISTORY OF SQUAMOUS CELL CARCINOMA OF THE SKIN - No evidence of recurrence today - No lymphadenopathy - Recommend regular full body skin exams - Recommend daily broad spectrum sunscreen SPF 30+ to sun-exposed areas, reapply every 2 hours as needed.  - Call if any new or changing lesions are noted between office visits - L forearm, R forearm, L mid dorsum forearm, L post lower leg  HISTORY OF SQUAMOUS CELL CARCINOMA IN SITU OF THE SKIN - No evidence of recurrence today - Recommend regular full body skin exams - Recommend daily broad spectrum sunscreen SPF 30+ to sun-exposed areas, reapply every 2 hours as needed.  - Call if any new or changing lesions are noted between office visits  - R lower leg  HISTORY OF DYSPLASTIC NEVUS No evidence of recurrence today Recommend regular full body skin exams Recommend daily broad spectrum sunscreen SPF 30+ to sun-exposed areas, reapply every 2 hours as needed.  Call if any new or changing lesions are noted between office visits  - R dorsum 2nd toe, R ant thigh  TELANGIECTASIA  nose Exam: dilated blood vessel(s)  Treatment Plan: Benign appearing on  exam Call for changes  Counseling for BBL / IPL / Laser and Coordination of Care Discussed the treatment option of Broad Band Light (BBL) /Intense Pulsed Light (IPL)/ Laser for skin discoloration, including brown spots and redness.  Typically we recommend at least 1-3 treatment sessions about 5-8 weeks apart for best results.  Cannot have tanned skin when BBL performed, and regular use of sunscreen/photoprotection is advised after the procedure to help maintain results. The patient's condition may also require "maintenance treatments" in the future.  The fee for BBL / laser treatments is $350 per treatment session for the whole face.  A fee can be quoted for other parts of the body.  Insurance typically does not pay for BBL/laser treatments and therefore the fee is an out-of-pocket cost. Recommend prophylactic valtrex treatment. Once scheduled for procedure, will send Rx in prior to patient's appointment.   Milia - tiny firm white papules - type of cyst - benign - sometimes these will clear with nightly OTC adapalene/Differin 0.1% gel or retinol. 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  Telogen effluvium is a benign, self-limited condition causing increased hair shedding usually for several months. It does not progress to baldness, and the hair eventually grows back on its own. It can be triggered by recent illness, recent surgery, thyroid disease, low  iron stores, vitamin D deficiency, fad diets or rapid weight loss, hormonal changes such as pregnancy or birth control pills, and some medication. Usually the hair loss starts 2-3 months after the illness or health change. Rarely, it can continue for longer than a year. Treatments options may include oral or topical Minoxidil; Red Light scalp treatments; Biotin 2.5 mg daily  and other options.  Treatment Plan: Discussed as pt transitions off Methotrexate to Cimzia hair loss should probably improve Recommend Rogaine 5% as directed Recommend Biotin 2.5mg  po qd  NAIL PROBLEM Trauma vs other Exam: toenails with dystrophy with proximal clearing Treatment Plan: Benign appearing, observe     Return in about 6 months (around 08/17/2023) for TBSE, Hx of BCC, Hx of SCC, Hx of SCC IS, Hx of Dysplastic nevi, Hx of AKs, BBL appt.  I, Ardis Rowan, RMA, am acting as scribe for Armida Sans, MD .   Documentation: I have reviewed the above documentation for accuracy and completeness, and I agree with the above.  Armida Sans, MD

## 2023-02-18 ENCOUNTER — Other Ambulatory Visit (INDEPENDENT_AMBULATORY_CARE_PROVIDER_SITE_OTHER): Payer: Self-pay

## 2023-02-18 ENCOUNTER — Ambulatory Visit: Payer: BLUE CROSS/BLUE SHIELD | Admitting: Dermatology

## 2023-02-25 ENCOUNTER — Telehealth (INDEPENDENT_AMBULATORY_CARE_PROVIDER_SITE_OTHER): Payer: Self-pay

## 2023-02-25 NOTE — Telephone Encounter (Signed)
Patient called to ask if she can wear compression sock after her laser procedure tomorrow or did it have to be stockings.  I left a voicemail stating that she can wear the compression sock as long has they come up to her knee.

## 2023-02-26 ENCOUNTER — Ambulatory Visit (INDEPENDENT_AMBULATORY_CARE_PROVIDER_SITE_OTHER): Payer: Medicare Other | Admitting: Vascular Surgery

## 2023-02-26 ENCOUNTER — Encounter (INDEPENDENT_AMBULATORY_CARE_PROVIDER_SITE_OTHER): Payer: Self-pay | Admitting: Vascular Surgery

## 2023-02-26 VITALS — BP 114/70 | HR 62 | Resp 18 | Ht 64.0 in | Wt 145.0 lb

## 2023-02-26 DIAGNOSIS — I83813 Varicose veins of bilateral lower extremities with pain: Secondary | ICD-10-CM

## 2023-02-26 NOTE — Progress Notes (Signed)
Taylor Hunt is a 72 y.o. female who presents with symptomatic venous reflux  Past Medical History:  Diagnosis Date   Anemia    Anxiety    Arthritis    Basal cell carcinoma 10/24/2020   right nasal tip, Moh's 01/09/2021   BCC (basal cell carcinoma) 11/26/2021   Left lateral pretibia. EDC 12/24/2021   Cancer (HCC)    skin   CHF (congestive heart failure) (HCC)    GERD (gastroesophageal reflux disease)    Headache    Heart murmur    Hx of dysplastic nevus 10/15/2009   R dorsum 2nd toe, mild atypia   Hx of dysplastic nevus 11/07/2012   R mid anterior thigh, modterate atypia   Hyperlipemia    Hypothyroidism    Pneumonia    PONV (postoperative nausea and vomiting)    Squamous cell carcinoma of skin 09/15/2006   L forearm, excised   Squamous cell carcinoma of skin 03/21/2008   R lower leg, SCCIS excised   Squamous cell carcinoma of skin 07/19/2008   R forearm   Squamous cell carcinoma of skin 10/25/2012   L mid dorsum forearm   Squamous cell carcinoma of skin 01/24/2014   L posterior lower leg    Past Surgical History:  Procedure Laterality Date   ANTERIOR CERVICAL DECOMP/DISCECTOMY FUSION N/A 09/18/2015   Procedure: ANTERIOR CERVICAL DECOMPRESSION/DISCECTOMY INTERBODY FUSION PLATING BONEGRAFT CERVICAL FIVE-SIX ,CERVICAL SIX-SEVEN ;  Surgeon: Tressie Stalker, MD;  Location: [facility];  Service: Neurosurgery;  Laterality: N/A;   APPENDECTOMY     both shoulder surgery     BREAST EXCISIONAL BIOPSY Right    @ 2000 2 areas excised   CHOLECYSTECTOMY     COLONOSCOPY     COLONOSCOPY WITH PROPOFOL N/A 12/27/2019   Procedure: COLONOSCOPY WITH PROPOFOL;  Surgeon: Earline Mayotte, MD;  Location: [facility];  Service: Endoscopy;  Laterality: N/A;   EYE SURGERY     NEUROPLASTY / TRANSPOSITION ULNAR NERVE AT ELBOW  02/1996   OOPHORECTOMY     right knee surgery     skin cancer removed     TONSILLECTOMY       Current Outpatient Medications:    metoprolol tartrate  (LOPRESSOR) 100 MG tablet, Take 1 tablet (100 mg total) by mouth once for 1 dose. Please take one time dose 100mg  metoprolol tartrate 2 hr prior to cardiac CT for HR control IF HR >55bpm., Disp: 1 tablet, Rfl: 0   acetaminophen (TYLENOL) 325 MG tablet, Take 650 mg by mouth every 6 (six) hours as needed for moderate pain or headache., Disp: , Rfl:    Alirocumab (PRALUENT) 150 MG/ML SOAJ, Inject into the skin. (Patient not taking: Reported on 07/03/2022), Disp: , Rfl:    B Complex-C (B-COMPLEX WITH VITAMIN C) tablet, Take 1 tablet by mouth daily. , Disp: , Rfl:    Calcium Carbonate-Vit D-Min (CALTRATE 600+D PLUS PO), Take by mouth 2 (two) times daily with a meal., Disp: , Rfl:    celecoxib (CELEBREX) 200 MG capsule, TAKE 1 CAPSULE (200 MG TOTAL) BY MOUTH 2 (TWO) TIMES A DAY., Disp: , Rfl:    Certolizumab Pegol (CIMZIA PREFILLED [place]), Inject into the skin., Disp: , Rfl:    cyclobenzaprine (FLEXERIL) 10 MG tablet, Take 10 mg by mouth at bedtime., Disp: , Rfl:    diclofenac sodium (VOLTAREN) 1 % GEL, Apply 2 g topically 4 (four) times daily. , Disp: , Rfl:    diltiazem (CARDIZEM CD) 240 MG 24 hr capsule, Take 240  mg by mouth daily., Disp: , Rfl:    ezetimibe (ZETIA) 10 MG tablet, Take 10 mg by mouth daily., Disp: , Rfl:    fluorouracil (EFUDEX) 5 % cream, Apply to Tops of hands, top of elbows  twice a day for 7 days,Apply to the full nose and right lateral cheek  twice a day for 4 days, Disp: 60 g, Rfl: 2   fluticasone (FLONASE) 50 MCG/ACT nasal spray, Place 1 spray into both nostrils daily as needed for allergies or rhinitis. , Disp: , Rfl:    hydrochlorothiazide (HYDRODIURIL) 25 MG tablet, Take by mouth., Disp: , Rfl:    magnesium oxide (MAG-OX) 400 MG tablet, Take 400 mg by mouth daily., Disp: , Rfl:    methotrexate (RHEUMATREX) 2.5 MG tablet, Take by mouth., Disp: , Rfl:    montelukast (SINGULAIR) 10 MG tablet, TAKE ONE TABLET BY MOUTH EVERY NIGHT AT BEDTIME, Disp: , Rfl:    Multiple Vitamin  (MULTIVITAMIN WITH MINERALS) TABS tablet, Take 1 tablet by mouth daily., Disp: , Rfl:    mupirocin ointment (BACTROBAN) 2 %, Apply 1 application topically daily., Disp: 22 g, Rfl: 1   ondansetron (ZOFRAN-ODT) 4 MG disintegrating tablet, Take 1 tablet (4 mg total) by mouth every 8 (eight) hours as needed for nausea or vomiting., Disp: 15 tablet, Rfl: 0   REPATHA SURECLICK 140 MG/ML SOAJ, Inject into the skin every 14 (fourteen) days., Disp: , Rfl:    triamcinolone (KENALOG) 0.025 % ointment, Apply twice daily to affected areas up to 5 days., Disp: 30 g, Rfl: 0   valACYclovir (VALTREX) 1000 MG tablet, Take 2 tablets in the morning and 2 tablets in the evening for one day, as needed for flare, Disp: , Rfl:    vitamin B-12 (CYANOCOBALAMIN) 1000 MCG tablet, Take 1,000 mcg by mouth daily. , Disp: , Rfl:    Zinc Citrate-Phytase 25-500 MG CAPS, Take by mouth daily., Disp: , Rfl:   Allergies  Allergen Reactions   Fentanyl Nausea And Vomiting   Codeine Other (See Comments)    Dizziness, nausea, vomiting   Gabapentin Other (See Comments)   Hydrocodone Nausea And Vomiting   Lovastatin Other (See Comments)    Muscle pain   Morphine Hives   Tramadol Nausea And Vomiting     Varicose veins of bilateral lower extremities with pain     PLAN: The patient's right lower extremity was sterilely prepped and draped. The ultrasound machine was used to visualize the saphenous vein throughout its course. A segment just below the knee was selected for access. The saphenous vein was accessed without difficulty using ultrasound guidance with a micropuncture needle. A 0.018 wire was then placed beyond the saphenofemoral junction and the needle was removed. The 65 cm sheath was then placed over the wire and the wire and dilator were removed. The laser fiber was then placed through the sheath and its tip was placed approximately 5 centimeters below the saphenofemoral junction. Tumescent anesthesia was then created with a  dilute lidocaine solution. Laser energy was then delivered with constant withdrawal of the sheath and laser fiber. Approximately 1305 joules of energy were delivered over a length of 31 centimeters using a 1470 Hz VenaCure machine at 7 W. Sterile dressings were placed. The patient tolerated the procedure well without obvious complications.   Follow-up in 1 week with post-laser duplex.

## 2023-03-01 ENCOUNTER — Other Ambulatory Visit: Payer: Self-pay | Admitting: Internal Medicine

## 2023-03-01 DIAGNOSIS — Z1231 Encounter for screening mammogram for malignant neoplasm of breast: Secondary | ICD-10-CM

## 2023-03-02 ENCOUNTER — Emergency Department
Admission: EM | Admit: 2023-03-02 | Discharge: 2023-03-02 | Disposition: A | Discharge status: Home / Self Care | Payer: Medicare Other | Attending: Emergency Medicine | Admitting: Emergency Medicine

## 2023-03-02 ENCOUNTER — Emergency Department: Payer: Medicare Other

## 2023-03-02 ENCOUNTER — Other Ambulatory Visit: Payer: Self-pay

## 2023-03-02 DIAGNOSIS — N200 Calculus of kidney: Secondary | ICD-10-CM | POA: Diagnosis not present

## 2023-03-02 DIAGNOSIS — R109 Unspecified abdominal pain: Secondary | ICD-10-CM | POA: Diagnosis present

## 2023-03-02 DIAGNOSIS — I509 Heart failure, unspecified: Secondary | ICD-10-CM | POA: Diagnosis not present

## 2023-03-02 LAB — CBC
HCT: 40.8 % (ref 36.0–46.0)
Hemoglobin: 13.2 g/dL (ref 12.0–15.0)
MCH: 29 pg (ref 26.0–34.0)
MCHC: 32.4 g/dL (ref 30.0–36.0)
MCV: 89.7 fL (ref 80.0–100.0)
Platelets: 360 10*3/uL (ref 150–400)
RBC: 4.55 MIL/uL (ref 3.87–5.11)
RDW: 16.9 % — ABNORMAL HIGH (ref 11.5–15.5)
WBC: 11 10*3/uL — ABNORMAL HIGH (ref 4.0–10.5)
nRBC: 0 % (ref 0.0–0.2)

## 2023-03-02 LAB — COMPREHENSIVE METABOLIC PANEL
ALT: 20 U/L (ref 0–44)
AST: 25 U/L (ref 15–41)
Albumin: 4.5 g/dL (ref 3.5–5.0)
Alkaline Phosphatase: 57 U/L (ref 38–126)
Anion gap: 12 (ref 5–15)
BUN: 19 mg/dL (ref 8–23)
CO2: 23 mmol/L (ref 22–32)
Calcium: 9.9 mg/dL (ref 8.9–10.3)
Chloride: 105 mmol/L (ref 98–111)
Creatinine, Ser: 0.88 mg/dL (ref 0.44–1.00)
GFR, Estimated: >60 mL/min (ref >60)
Glucose, Bld: 131 mg/dL — ABNORMAL HIGH (ref 70–99)
Potassium: 4.5 mmol/L (ref 3.5–5.1)
Sodium: 140 mmol/L (ref 135–145)
Total Bilirubin: 0.7 mg/dL (ref 0.3–1.2)
Total Protein: 7.3 g/dL (ref 6.5–8.1)

## 2023-03-02 LAB — URINALYSIS, ROUTINE W REFLEX MICROSCOPIC
Bilirubin Urine: NEGATIVE
Glucose, UA: NEGATIVE mg/dL
Hgb urine dipstick: NEGATIVE
Ketones, ur: 20 mg/dL — AB
Leukocytes,Ua: NEGATIVE
Nitrite: NEGATIVE
Protein, ur: NEGATIVE mg/dL
Specific Gravity, Urine: 1.011 (ref 1.005–1.030)
pH: 7 (ref 5.0–8.0)

## 2023-03-02 MED ORDER — HYDROCODONE-ACETAMINOPHEN 5-325 MG PO TABS
1.0000 | ORAL_TABLET | Freq: Once | ORAL | Status: AC
  Administered 2023-03-02: 1 via ORAL
  Filled 2023-03-02: qty 1

## 2023-03-02 MED ORDER — ONDANSETRON HCL 4 MG/2ML IJ SOLN
4.0000 mg | Freq: Once | INTRAMUSCULAR | Status: AC
  Administered 2023-03-02: 4 mg via INTRAVENOUS
  Filled 2023-03-02: qty 2

## 2023-03-02 MED ORDER — KETOROLAC TROMETHAMINE 30 MG/ML IJ SOLN
15.0000 mg | Freq: Once | INTRAMUSCULAR | Status: AC
  Administered 2023-03-02: 15 mg via INTRAVENOUS
  Filled 2023-03-02: qty 1

## 2023-03-02 MED ORDER — TAMSULOSIN HCL 0.4 MG PO CAPS: 0.4000 mg | ORAL_CAPSULE | Freq: Every day | ORAL | 0 refills | Status: AC

## 2023-03-02 MED ORDER — ONDANSETRON 4 MG PO TBDP: 4.0000 mg | ORAL_TABLET | Freq: Three times a day (TID) | ORAL | 0 refills | Status: AC | PRN

## 2023-03-02 MED ORDER — SODIUM CHLORIDE 0.9 % IV BOLUS
500.0000 mL | Freq: Once | INTRAVENOUS | Status: AC
  Administered 2023-03-02: 500 mL via INTRAVENOUS

## 2023-03-02 MED ORDER — HYDROCODONE-ACETAMINOPHEN 5-325 MG PO TABS: 1.0000 | ORAL_TABLET | ORAL | 0 refills | Status: DC | PRN

## 2023-03-02 NOTE — ED Triage Notes (Signed)
Pt reports left flank pain that started today. Pt reports nausea and vomiting. Reports feeling the urge to urinate, but when she goes it is only a small amount. Pt denies hx of kidney stones. Pt A&Ox4 at time of triage. Pt was driven here by a friend.

## 2023-03-02 NOTE — ED Notes (Signed)
Pt c/o ongoing nausea and flank pain. Provider notified for additional med coverage.

## 2023-03-02 NOTE — ED Provider Notes (Signed)
[facility] Provider Note    Event Date/Time   First MD Initiated Contact with Patient 03/02/23 1619     (approximate)  History   Chief Complaint: Flank Pain  HPI  Taylor Hunt is a 72 y.o. female with a past medical history of anemia, anxiety, CHF, hyperlipidemia, gastric reflux, presents to the emergency department for acute onset of left flank pain nausea vomiting.  According to the patient she woke up felt normal this morning until around 10 AM when she had acute onset of left flank pain.  Describes it as sharp in nature associate with nausea and vomiting.  Patient admits she has been eating peanuts recently but no history of diverticulitis.  No kidney stone history.  Patient denies any urinary symptoms.  No fever.  No cough or congestion.  Physical Exam   Triage Vital Signs: ED Triage Vitals  Encounter Vitals Group     BP 03/02/23 1525 (!) 145/85     Systolic BP Percentile --      Diastolic BP Percentile --      Pulse Rate 03/02/23 1525 83     Resp 03/02/23 1525 18     Temp 03/02/23 1525 98.4 F (36.9 C)     Temp Source 03/02/23 1525 Oral     SpO2 03/02/23 1525 100 %     Weight 03/02/23 1526 138 lb (62.6 kg)     Height 03/02/23 1526 5\' 5"  (1.651 m)     Head Circumference --      Peak Flow --      Pain Score 03/02/23 1525 9     Pain Loc --      Pain Education --      Exclude from Growth Chart --     Most recent vital signs: Vitals:   03/02/23 1525  BP: (!) 145/85  Pulse: 83  Resp: 18  Temp: 98.4 F (36.9 C)  SpO2: 100%    General: Awake, no distress.  CV:  Good peripheral perfusion.  Regular rate and rhythm  Resp:  Normal effort.  Equal breath sounds bilaterally.  Abd:  No distention.  Soft, nontender.  No rebound or guarding.  Largely benign abdomen.   ED Results / Procedures / Treatments   RADIOLOGY  I have reviewed and interpreted CT images.  No significant finding on my evaluation. Radiology is read the CT is a distal left  ureteral stone.   MEDICATIONS ORDERED IN ED: Medications  ondansetron (ZOFRAN) injection 4 mg (has no administration in time range)  ketorolac (TORADOL) 30 MG/ML injection 15 mg (has no administration in time range)  sodium chloride 0.9 % bolus 500 mL (has no administration in time range)     IMPRESSION / MDM / ASSESSMENT AND PLAN / ED COURSE  I reviewed the triage vital signs and the nursing notes.  Patient's presentation is most consistent with acute presentation with potential threat to life or bodily function.  Patient presents emergency department for acute onset of left flank pain nausea vomiting this morning.  Overall the patient appears well she continues to state nausea continues to state pain, her abdominal exam is largely benign.  No fever.  We will obtain a CT renal scan to evaluate for kidney stone or other intra-abdominal pathology such as diverticulitis.  We will treat the patient's discomfort with Toradol given her many pain medication allergies we will also dose a small amount of fluids and Zofran we will continue to closely monitor the patient  while awaiting CT and urinalysis results.  Patient's lab work today shows a reassuring chemistry normal LFTs, reassuring CBC.  Patient's urinalysis shows no concerning findings.  Patient CT scan has resulted showing a 3 mm left distal ureteral stone.  Patient states her pain is much better.  Will discharge with Norco which the patient states she is taken in the past as long she has Zofran she does fine with it.  We will also prescribe Flomax and have the patient follow-up with urology.  Patient agreeable to plan of care.    FINAL CLINICAL IMPRESSION(S) / ED DIAGNOSES   Kidney stone    Note:  This document was prepared using Dragon voice recognition software and may include unintentional dictation errors.   Minna Antis, MD 03/02/23 2033

## 2023-03-02 NOTE — ED Notes (Signed)
Provided pt with discharge instructions and education. All of pt questions answered. Pt in possession of all belongings. Pt AAOX4 and stable at time of discharge.Pt ambulated w/ steady gait towards ED exit. Pt accompanied by family member.

## 2023-03-02 NOTE — Discharge Instructions (Addendum)
Please take your pain medication as needed, only as prescribed.  Please take your nausea medication as needed.  Please follow-up with urology for further evaluation.  Return to the emergency department for any fever or any other symptom personally concerning to yourself.

## 2023-03-04 LAB — URINE CULTURE: Culture: NO GROWTH

## 2023-03-04 MED ORDER — ALPRAZOLAM 0.5 MG PO TABS: ORAL_TABLET | ORAL | 0 refills | Status: DC

## 2023-03-05 ENCOUNTER — Ambulatory Visit (INDEPENDENT_AMBULATORY_CARE_PROVIDER_SITE_OTHER): Payer: Medicare Other

## 2023-03-05 DIAGNOSIS — I83813 Varicose veins of bilateral lower extremities with pain: Secondary | ICD-10-CM | POA: Diagnosis not present

## 2023-03-12 ENCOUNTER — Ambulatory Visit
Admission: RE | Admit: 2023-03-12 | Discharge: 2023-03-12 | Disposition: A | Discharge status: Home / Self Care | Payer: Medicare Other | Source: Ambulatory Visit | Attending: Urology | Admitting: Urology

## 2023-03-12 ENCOUNTER — Ambulatory Visit (INDEPENDENT_AMBULATORY_CARE_PROVIDER_SITE_OTHER): Payer: Medicare Other | Admitting: Urology

## 2023-03-12 ENCOUNTER — Encounter: Payer: Self-pay | Admitting: Urology

## 2023-03-12 VITALS — BP 124/73 | HR 69 | Ht 65.0 in | Wt 139.0 lb

## 2023-03-12 DIAGNOSIS — N200 Calculus of kidney: Secondary | ICD-10-CM

## 2023-03-12 DIAGNOSIS — N201 Calculus of ureter: Secondary | ICD-10-CM

## 2023-03-12 LAB — URINALYSIS, COMPLETE
Bilirubin, UA: NEGATIVE
Glucose, UA: NEGATIVE
Ketones, UA: NEGATIVE
Leukocytes,UA: NEGATIVE
Nitrite, UA: NEGATIVE
Protein,UA: NEGATIVE
RBC, UA: NEGATIVE
Specific Gravity, UA: 1.015 (ref 1.005–1.030)
Urobilinogen, Ur: 0.2 mg/dL (ref 0.2–1.0)
pH, UA: 6 (ref 5.0–7.5)

## 2023-03-12 LAB — MICROSCOPIC EXAMINATION
Bacteria, UA: NONE SEEN
RBC, Urine: NONE SEEN /[HPF] (ref 0–2)

## 2023-03-12 NOTE — Progress Notes (Signed)
I, Maysun Anabel Bene, acting as a scribe for Riki Altes, MD., have documented all relevant documentation on the behalf of Riki Altes, MD, as directed by Riki Altes, MD while in the presence of Riki Altes, MD.  03/12/2023 3:09 PM   Taylor Hunt 13-Dec-1950 [id number]  Referring provider: Danella Penton, MD [phone] [facility] [address] [facility] [place],  [place] [address]  Chief Complaint  Patient presents with   Nephrolithiasis    HPI: Taylor Hunt is a 72 y.o. female presents in follow-up a recent ED visit for renal colic.   Saw her PCP 03/02/23 with relatively acute onset of severe left lower quadrant abdominal pain associated with nausea and vomiting. Had chills but no fever. She was subsequently hospitalized and transported to the [facility] ED for further evaluation. The day prior to symptom onset, she had noted urinary frequency, urgency and voiding small amounts. Evaluation in the ED consisted of urinalysis which was negative on dipstick. Urine culture was performed, which showed no growth.  Stone protocol CT remarkable for a 3 mm left distal ureteral calculus with mild hydronephrosis/hydrourator. No prior history of stone disease.  She was treated with parenteral analgesics with improvement in her pain and was discharged on oral analgesics, tamsulosin, and Zofran.  36 hours later, she developed temp to 101.3 degrees, which resolved within 36 hours. She is feeling much better with the resolution of her pain, but not aware of passing a stone.  No prior history of stone disease.   PMH: Past Medical History:  Diagnosis Date   Anemia    Anxiety    Arthritis    Basal cell carcinoma 10/24/2020   right nasal tip, Moh's 01/09/2021   BCC (basal cell carcinoma) 11/26/2021   Left lateral pretibia. EDC 12/24/2021   Cancer (HCC)    skin   CHF (congestive heart failure) (HCC)    GERD (gastroesophageal reflux disease)    Headache    Heart murmur    Hx of  dysplastic nevus 10/15/2009   R dorsum 2nd toe, mild atypia   Hx of dysplastic nevus 11/07/2012   R mid anterior thigh, modterate atypia   Hyperlipemia    Hypothyroidism    Pneumonia    PONV (postoperative nausea and vomiting)    Squamous cell carcinoma of skin 09/15/2006   L forearm, excised   Squamous cell carcinoma of skin 03/21/2008   R lower leg, SCCIS excised   Squamous cell carcinoma of skin 07/19/2008   R forearm   Squamous cell carcinoma of skin 10/25/2012   L mid dorsum forearm   Squamous cell carcinoma of skin 01/24/2014   L posterior lower leg    Surgical History: Past Surgical History:  Procedure Laterality Date   ANTERIOR CERVICAL DECOMP/DISCECTOMY FUSION N/A 09/18/2015   Procedure: ANTERIOR CERVICAL DECOMPRESSION/DISCECTOMY INTERBODY FUSION PLATING BONEGRAFT CERVICAL FIVE-SIX ,CERVICAL SIX-SEVEN ;  Surgeon: Tressie Stalker, MD;  Location: [facility];  Service: Neurosurgery;  Laterality: N/A;   APPENDECTOMY     both shoulder surgery     BREAST EXCISIONAL BIOPSY Right    @ 2000 2 areas excised   CHOLECYSTECTOMY     COLONOSCOPY     COLONOSCOPY WITH PROPOFOL N/A 12/27/2019   Procedure: COLONOSCOPY WITH PROPOFOL;  Surgeon: Earline Mayotte, MD;  Location: [facility] ENDOSCOPY;  Service: Endoscopy;  Laterality: N/A;   EYE SURGERY     NEUROPLASTY / TRANSPOSITION ULNAR NERVE AT ELBOW  02/1996   OOPHORECTOMY  right knee surgery     skin cancer removed     TONSILLECTOMY      Home Medications:  Allergies as of 03/12/2023       Reactions   Fentanyl Nausea And Vomiting   Codeine Other (See Comments)   Dizziness, nausea, vomiting   Gabapentin Other (See Comments)   Hydrocodone Nausea And Vomiting   Lovastatin Other (See Comments)   Muscle pain   Morphine Hives   Tramadol Nausea And Vomiting        Medication List        Accurate as of March 12, 2023  3:09 PM. If you have any questions, ask your nurse or doctor.          STOP taking these  medications    Praluent 150 MG/ML Soaj Generic drug: Alirocumab Stopped by: Riki Altes       TAKE these medications    acetaminophen 325 MG tablet Commonly known as: TYLENOL Take 650 mg by mouth every 6 (six) hours as needed for moderate pain or headache.   ALPRAZolam 0.5 MG tablet Commonly known as: Xanax Take 1 tab one hour before procedure and 1 tab when you arrive in office Start taking on: April 30, 2023   B-complex with vitamin C tablet Take 1 tablet by mouth daily.   CALTRATE 600+D PLUS PO Take by mouth 2 (two) times daily with a meal.   CeleBREX 200 MG capsule Generic drug: celecoxib TAKE 1 CAPSULE (200 MG TOTAL) BY MOUTH 2 (TWO) TIMES A DAY.   CIMZIA PREFILLED [place] Inject into the skin.   cyanocobalamin 1000 MCG tablet Commonly known as: VITAMIN B12 Take 1,000 mcg by mouth daily.   cyclobenzaprine 10 MG tablet Commonly known as: FLEXERIL Take 10 mg by mouth at bedtime.   diclofenac sodium 1 % Gel Commonly known as: VOLTAREN Apply 2 g topically 4 (four) times daily.   diltiazem 240 MG 24 hr capsule Commonly known as: CARDIZEM CD Take 240 mg by mouth daily.   ezetimibe 10 MG tablet Commonly known as: ZETIA Take 10 mg by mouth daily.   fluorouracil 5 % cream Commonly known as: EFUDEX Apply to Tops of hands, top of elbows  twice a day for 7 days,Apply to the full nose and right lateral cheek  twice a day for 4 days   fluticasone 50 MCG/ACT nasal spray Commonly known as: FLONASE Place 1 spray into both nostrils daily as needed for allergies or rhinitis.   hydrochlorothiazide 25 MG tablet Commonly known as: HYDRODIURIL Take by mouth.   HYDROcodone-acetaminophen 5-325 MG tablet Commonly known as: NORCO/VICODIN Take 1 tablet by mouth every 4 (four) hours as needed.   magnesium oxide 400 MG tablet Commonly known as: MAG-OX Take 400 mg by mouth daily.   methotrexate 2.5 MG tablet Commonly known as: RHEUMATREX Take by mouth.    metoprolol tartrate 100 MG tablet Commonly known as: LOPRESSOR Take 1 tablet (100 mg total) by mouth once for 1 dose. Please take one time dose 100mg  metoprolol tartrate 2 hr prior to cardiac CT for HR control IF HR >55bpm.   montelukast 10 MG tablet Commonly known as: SINGULAIR TAKE ONE TABLET BY MOUTH EVERY NIGHT AT BEDTIME   multivitamin with minerals Tabs tablet Take 1 tablet by mouth daily.   mupirocin ointment 2 % Commonly known as: BACTROBAN Apply 1 application topically daily.   ondansetron 4 MG disintegrating tablet Commonly known as: ZOFRAN-ODT Take 1 tablet (4 mg total) by mouth  every 8 (eight) hours as needed for nausea or vomiting.   Repatha SureClick 140 MG/ML Soaj Generic drug: Evolocumab Inject into the skin every 14 (fourteen) days.   tamsulosin 0.4 MG Caps capsule Commonly known as: FLOMAX Take 1 capsule (0.4 mg total) by mouth daily.   triamcinolone 0.025 % ointment Commonly known as: KENALOG Apply twice daily to affected areas up to 5 days.   valACYclovir 1000 MG tablet Commonly known as: VALTREX Take 2 tablets in the morning and 2 tablets in the evening for one day, as needed for flare   Zinc Citrate-Phytase 25-500 MG Caps Take by mouth daily.        Allergies:  Allergies  Allergen Reactions   Fentanyl Nausea And Vomiting   Codeine Other (See Comments)    Dizziness, nausea, vomiting   Gabapentin Other (See Comments)   Hydrocodone Nausea And Vomiting   Lovastatin Other (See Comments)    Muscle pain   Morphine Hives   Tramadol Nausea And Vomiting    Family History: Family History  Problem Relation Age of Onset   Pancreatic cancer Mother    Lung cancer Father    Pancreatic cancer Maternal Uncle    Pancreatic cancer Brother    Melanoma Maternal Grandfather    Breast cancer Neg Hx     Social History:  reports that she has never smoked. She has never used smokeless tobacco. She reports current alcohol use. She reports that she does  not use drugs.   Physical Exam: BP 124/73   Pulse 69   Ht 5\' 5"  (1.651 m)   Wt 139 lb (63 kg)   BMI 23.13 kg/m   Constitutional:  Alert and oriented, No acute distress. HEENT: [place] AT, moist mucus membranes.  Trachea midline, no masses. Cardiovascular: No clubbing, cyanosis, or edema. Respiratory: Normal respiratory effort, no increased work of breathing. GI: Abdomen is soft, nontender, nondistended, no abdominal masses Skin: No rashes, bruises or suspicious lesions. Neurologic: Grossly intact, no focal deficits, moving all 4 extremities. Psychiatric: Normal mood and affect.   Urinalysis Pending    Pertinent Imaging: CT was personally reviewed and interpreted.   CT Renal Stone Study  Narrative CLINICAL DATA:  Abdominal and flank pain with stone suspected. Left flank pain starting today. Nausea and vomiting. Urinary urgency.  EXAM: CT ABDOMEN AND PELVIS WITHOUT CONTRAST  TECHNIQUE: Multidetector CT imaging of the abdomen and pelvis was performed following the standard protocol without IV contrast.  RADIATION DOSE REDUCTION: This exam was performed according to the departmental dose-optimization program which includes automated exposure control, adjustment of the mA and/or kV according to patient size and/or use of iterative reconstruction technique.  COMPARISON:  CT abdomen and pelvis 11/01/2020. MRI abdomen 08/20/2022  FINDINGS: Lower chest: Slight fibrosis or atelectasis in the lung bases. Left lower lung nodule, series 4, image 4, measuring 3 mm diameter. Cardiac enlargement.  Hepatobiliary: Multiple cysts in the liver, largest in segment 4 measuring 3.2 cm diameter. A previously demonstrated larger segment 4 lesion has involuted in the meantime since the prior study. Otherwise cysts appear stable. No imaging follow-up is indicated.  Pancreas: Unremarkable. No pancreatic ductal dilatation or surrounding inflammatory changes.  Spleen: Choose  line  Adrenals/Urinary Tract: No adrenal gland nodules. Mild hydronephrosis and hydroureter on the left with 3 mm stone demonstrated in the left ureterovesical junction. Right kidney, right ureter, and the bladder are normal.  Stomach/Bowel: Stomach, small bowel, and colon are not abnormally distended. No wall thickening or inflammatory changes. Residual  contrast material in the cecum. Appendix is not identified.  Vascular/Lymphatic: Aortic atherosclerosis. No enlarged abdominal or pelvic lymph nodes.  Reproductive: Uterus and bilateral adnexa are unremarkable.  Other: No free air or free fluid in the abdomen. Small periumbilical hernia containing fat.  Musculoskeletal: Degenerative changes in the lumbar spine. Mild spondylolisthesis at L4-5 is unchanged, likely degenerative. No acute bony abnormalities.  IMPRESSION: 1. 3 mm stone in the distal left ureter with moderate proximal obstruction. 2. Multiple liver lesions consistent with cysts, not significantly changed since prior study. No imaging follow-up is indicated. 3. Left solid pulmonary nodule measuring 3 mm. Per Fleischner Society Guidelines, no routine follow-up imaging is recommended. These guidelines do not apply to immunocompromised patients and patients with cancer. Follow up in patients with significant comorbidities as clinically warranted. For lung cancer screening, adhere to Lung-RADS guidelines. Reference: Radiology. 2017; 284(1):228-43. 4. Aortic atherosclerosis.   Electronically Signed By: Burman Nieves M.D. On: 03/02/2023 19:56   Assessment & Plan:    1. Left distal ureteral calculus KUB today ordered to see if the calculus can still be visualized.  If not visualized, would recommend a follow-up pelvic CT to document persistence or resolution of the calculus.  We discussed various treatment options for urolithiasis including observation with or without medical expulsive therapy, shockwave  lithotripsy (SWL), ureteroscopy and laser lithotripsy with stent placement, and percutaneous nephrolithotomy. We discussed that management is based on stone size, location, density, patient co-morbidities, and patient preference.  Stones <49mm in size have a >80% spontaneous passage rate. Data surrounding the use of tamsulosin for medical expulsive therapy is controversial, but meta analyses suggests it is most efficacious for distal stones between 5-45mm in size. Possible side effects include dizziness/lightheadedness, and retrograde ejaculation. SWL has a lower stone free rate in a single procedure, but also a lower complication rate compared to ureteroscopy and avoids a stent and associated stent related symptoms. Possible complications include renal hematoma, steinstrasse, and need for additional treatment. Ureteroscopy with laser lithotripsy and stent placement has a higher stone free rate than SWL in a single procedure, however increased complication rate including possible infection, ureteral injury, bleeding, and stent related morbidity. Common stent related symptoms include dysuria, urgency/frequency, and flank pain. PCNL is the favored treatment for stones >2cm. It involves a small incision in the flank, with complete fragmentation of stones and removal. It has the highest stone free rate, but also the highest complication rate. Possible complications include bleeding, infection/sepsis, injury to surrounding organs including the pleura, and collecting system injury.  She is first time stone former. We discussed she has an approximately 50% chance of forming a stone within the next 5 years. We discussed stone prevention recommendations, including increasing water intake to keep urine output at 2.5 liters per day or greater, dietary oxalate moderation, dietary citrus and avoiding added salt/high sodium processed foods.  She was provided literature on stone prevention.  [facility] [address], [address] [place], [place] [address] [phone]

## 2023-03-14 ENCOUNTER — Encounter: Payer: Self-pay | Admitting: Urology

## 2023-03-16 ENCOUNTER — Encounter: Payer: Self-pay | Admitting: *Deleted

## 2023-03-23 ENCOUNTER — Ambulatory Visit: Payer: Medicare Other

## 2023-04-07 ENCOUNTER — Other Ambulatory Visit: Payer: Self-pay | Admitting: Internal Medicine

## 2023-04-07 ENCOUNTER — Ambulatory Visit
Admission: RE | Admit: 2023-04-07 | Discharge: 2023-04-07 | Disposition: A | Discharge status: Home / Self Care | Payer: Medicare Other | Source: Ambulatory Visit | Attending: Internal Medicine | Admitting: Internal Medicine

## 2023-04-07 DIAGNOSIS — R1012 Left upper quadrant pain: Secondary | ICD-10-CM | POA: Diagnosis present

## 2023-04-07 DIAGNOSIS — N23 Unspecified renal colic: Secondary | ICD-10-CM | POA: Diagnosis present

## 2023-04-12 ENCOUNTER — Ambulatory Visit
Admission: RE | Admit: 2023-04-12 | Discharge: 2023-04-12 | Disposition: A | Discharge status: Home / Self Care | Payer: Medicare Other | Source: Ambulatory Visit | Attending: Internal Medicine | Admitting: Internal Medicine

## 2023-04-12 DIAGNOSIS — Z1231 Encounter for screening mammogram for malignant neoplasm of breast: Secondary | ICD-10-CM | POA: Insufficient documentation

## 2023-04-27 ENCOUNTER — Other Ambulatory Visit: Payer: Self-pay | Admitting: Medical Genetics

## 2023-05-03 ENCOUNTER — Telehealth (INDEPENDENT_AMBULATORY_CARE_PROVIDER_SITE_OTHER): Payer: Self-pay | Admitting: Vascular Surgery

## 2023-05-03 ENCOUNTER — Other Ambulatory Visit
Admission: RE | Admit: 2023-05-03 | Discharge: 2023-05-03 | Disposition: A | Discharge status: Home / Self Care | Payer: Self-pay | Source: Ambulatory Visit | Attending: Medical Genetics | Admitting: Medical Genetics

## 2023-05-03 NOTE — Telephone Encounter (Signed)
Patient is set to have laser ablation on Friday 12.27.24 and needs a RX of Xanax called into [facility] in [place]. Patient lives in [place] and does not want to drive without knowing it is there. Please advise.

## 2023-05-03 NOTE — Telephone Encounter (Signed)
Xanax was sent in on 04/30/23.

## 2023-05-04 NOTE — Telephone Encounter (Signed)
Going to pick up today

## 2023-05-04 NOTE — Telephone Encounter (Signed)
She should call her pharmacy to verify

## 2023-05-07 ENCOUNTER — Ambulatory Visit (INDEPENDENT_AMBULATORY_CARE_PROVIDER_SITE_OTHER): Payer: Medicare Other | Admitting: Vascular Surgery

## 2023-05-07 ENCOUNTER — Encounter (INDEPENDENT_AMBULATORY_CARE_PROVIDER_SITE_OTHER): Payer: Self-pay | Admitting: Vascular Surgery

## 2023-05-07 VITALS — BP 129/77 | HR 72 | Resp 16 | Ht 65.0 in | Wt 139.0 lb

## 2023-05-07 DIAGNOSIS — I83813 Varicose veins of bilateral lower extremities with pain: Secondary | ICD-10-CM

## 2023-05-07 NOTE — Progress Notes (Signed)
Taylor Hunt is a 72 y.o. female who presents with symptomatic venous reflux  Past Medical History:  Diagnosis Date   Anemia    Anxiety    Arthritis    Basal cell carcinoma 10/24/2020   right nasal tip, Moh's 01/09/2021   BCC (basal cell carcinoma) 11/26/2021   Left lateral pretibia. EDC 12/24/2021   Cancer (HCC)    skin   CHF (congestive heart failure) (HCC)    GERD (gastroesophageal reflux disease)    Headache    Heart murmur    Hx of dysplastic nevus 10/15/2009   R dorsum 2nd toe, mild atypia   Hx of dysplastic nevus 11/07/2012   R mid anterior thigh, modterate atypia   Hyperlipemia    Hypothyroidism    Pneumonia    PONV (postoperative nausea and vomiting)    Squamous cell carcinoma of skin 09/15/2006   L forearm, excised   Squamous cell carcinoma of skin 03/21/2008   R lower leg, SCCIS excised   Squamous cell carcinoma of skin 07/19/2008   R forearm   Squamous cell carcinoma of skin 10/25/2012   L mid dorsum forearm   Squamous cell carcinoma of skin 01/24/2014   L posterior lower leg    Past Surgical History:  Procedure Laterality Date   ANTERIOR CERVICAL DECOMP/DISCECTOMY FUSION N/A 09/18/2015   Procedure: ANTERIOR CERVICAL DECOMPRESSION/DISCECTOMY INTERBODY FUSION PLATING BONEGRAFT CERVICAL FIVE-SIX ,CERVICAL SIX-SEVEN ;  Surgeon: Tressie Stalker, MD;  Location: [facility];  Service: Neurosurgery;  Laterality: N/A;   APPENDECTOMY     both shoulder surgery     BREAST EXCISIONAL BIOPSY Right    @ 2000 2 areas excised   CHOLECYSTECTOMY     COLONOSCOPY     COLONOSCOPY WITH PROPOFOL N/A 12/27/2019   Procedure: COLONOSCOPY WITH PROPOFOL;  Surgeon: Earline Mayotte, MD;  Location: [facility];  Service: Endoscopy;  Laterality: N/A;   EYE SURGERY     NEUROPLASTY / TRANSPOSITION ULNAR NERVE AT ELBOW  02/1996   OOPHORECTOMY     right knee surgery     skin cancer removed     TONSILLECTOMY       Current Outpatient Medications:    acetaminophen (TYLENOL) 325 MG  tablet, Take 650 mg by mouth every 6 (six) hours as needed for moderate pain or headache., Disp: , Rfl:    ALPRAZolam (XANAX) 0.5 MG tablet, Take 1 tab one hour before procedure and 1 tab when you arrive in office, Disp: 2 tablet, Rfl: 0   B Complex-C (B-COMPLEX WITH VITAMIN C) tablet, Take 1 tablet by mouth daily. , Disp: , Rfl:    Calcium Carbonate-Vit D-Min (CALTRATE 600+D PLUS PO), Take by mouth 2 (two) times daily with a meal., Disp: , Rfl:    celecoxib (CELEBREX) 200 MG capsule, TAKE 1 CAPSULE (200 MG TOTAL) BY MOUTH 2 (TWO) TIMES A DAY., Disp: , Rfl:    Certolizumab Pegol (CIMZIA PREFILLED [place]), Inject into the skin., Disp: , Rfl:    cyclobenzaprine (FLEXERIL) 10 MG tablet, Take 10 mg by mouth at bedtime., Disp: , Rfl:    diclofenac sodium (VOLTAREN) 1 % GEL, Apply 2 g topically 4 (four) times daily. , Disp: , Rfl:    diltiazem (CARDIZEM CD) 240 MG 24 hr capsule, Take 240 mg by mouth daily., Disp: , Rfl:    ezetimibe (ZETIA) 10 MG tablet, Take 10 mg by mouth daily., Disp: , Rfl:    fluorouracil (EFUDEX) 5 % cream, Apply to Tops of hands, top of  elbows  twice a day for 7 days,Apply to the full nose and right lateral cheek  twice a day for 4 days, Disp: 60 g, Rfl: 2   fluticasone (FLONASE) 50 MCG/ACT nasal spray, Place 1 spray into both nostrils daily as needed for allergies or rhinitis. , Disp: , Rfl:    hydrochlorothiazide (HYDRODIURIL) 25 MG tablet, Take by mouth., Disp: , Rfl:    magnesium oxide (MAG-OX) 400 MG tablet, Take 400 mg by mouth daily., Disp: , Rfl:    methotrexate (RHEUMATREX) 2.5 MG tablet, Take by mouth., Disp: , Rfl:    metoprolol tartrate (LOPRESSOR) 100 MG tablet, Take 1 tablet (100 mg total) by mouth once for 1 dose. Please take one time dose 100mg  metoprolol tartrate 2 hr prior to cardiac CT for HR control IF HR >55bpm., Disp: 1 tablet, Rfl: 0   montelukast (SINGULAIR) 10 MG tablet, TAKE ONE TABLET BY MOUTH EVERY NIGHT AT BEDTIME, Disp: , Rfl:    Multiple Vitamin  (MULTIVITAMIN WITH MINERALS) TABS tablet, Take 1 tablet by mouth daily., Disp: , Rfl:    mupirocin ointment (BACTROBAN) 2 %, Apply 1 application topically daily., Disp: 22 g, Rfl: 1   ondansetron (ZOFRAN-ODT) 4 MG disintegrating tablet, Take 1 tablet (4 mg total) by mouth every 8 (eight) hours as needed for nausea or vomiting., Disp: 20 tablet, Rfl: 0   REPATHA SURECLICK 140 MG/ML SOAJ, Inject into the skin every 14 (fourteen) days., Disp: , Rfl:    tamsulosin (FLOMAX) 0.4 MG CAPS capsule, Take 1 capsule (0.4 mg total) by mouth daily., Disp: 30 capsule, Rfl: 0   triamcinolone (KENALOG) 0.025 % ointment, Apply twice daily to affected areas up to 5 days., Disp: 30 g, Rfl: 0   valACYclovir (VALTREX) 1000 MG tablet, Take 2 tablets in the morning and 2 tablets in the evening for one day, as needed for flare, Disp: , Rfl:    vitamin B-12 (CYANOCOBALAMIN) 1000 MCG tablet, Take 1,000 mcg by mouth daily. , Disp: , Rfl:    Zinc Citrate-Phytase 25-500 MG CAPS, Take by mouth daily., Disp: , Rfl:    HYDROcodone-acetaminophen (NORCO/VICODIN) 5-325 MG tablet, Take 1 tablet by mouth every 4 (four) hours as needed., Disp: 20 tablet, Rfl: 0  Allergies  Allergen Reactions   Fentanyl Nausea And Vomiting   Codeine Other (See Comments)    Dizziness, nausea, vomiting   Gabapentin Other (See Comments)   Hydrocodone Nausea And Vomiting   Lovastatin Other (See Comments)    Muscle pain   Morphine Hives   Tramadol Nausea And Vomiting     Varicose veins of bilateral lower extremities with pain     PLAN: The patient's left lower extremity was sterilely prepped and draped. The ultrasound machine was used to visualize the saphenous vein throughout its course. A segment in the upper calf was selected for access. The saphenous vein was accessed without difficulty using ultrasound guidance with a micropuncture needle. A 0.018 wire was then placed beyond the saphenofemoral junction and the needle was removed. The 65 cm  sheath was then placed over the wire and the wire and dilator were removed. The laser fiber was then placed through the sheath and its tip was placed approximately 4-5 centimeters below the saphenofemoral junction. Tumescent anesthesia was then created with a dilute lidocaine solution. Laser energy was then delivered with constant withdrawal of the sheath and laser fiber. Approximately 1395 joules of energy were delivered over a length of 33 centimeters using a 1470 Hz VenaCure machine  at 7 W. Sterile dressings were placed. The patient tolerated the procedure well without obvious complications.   Follow-up in 1 week with post-laser duplex.

## 2023-05-11 ENCOUNTER — Other Ambulatory Visit (INDEPENDENT_AMBULATORY_CARE_PROVIDER_SITE_OTHER): Payer: Self-pay | Admitting: Vascular Surgery

## 2023-05-11 DIAGNOSIS — I83813 Varicose veins of bilateral lower extremities with pain: Secondary | ICD-10-CM

## 2023-05-14 ENCOUNTER — Ambulatory Visit (INDEPENDENT_AMBULATORY_CARE_PROVIDER_SITE_OTHER): Payer: Medicare Other

## 2023-05-14 DIAGNOSIS — I83813 Varicose veins of bilateral lower extremities with pain: Secondary | ICD-10-CM | POA: Diagnosis not present

## 2023-05-14 LAB — GENECONNECT MOLECULAR SCREEN: Genetic Analysis Overall Interpretation: NEGATIVE

## 2023-06-01 ENCOUNTER — Ambulatory Visit (INDEPENDENT_AMBULATORY_CARE_PROVIDER_SITE_OTHER): Payer: Medicare Other | Admitting: Vascular Surgery

## 2023-06-11 ENCOUNTER — Encounter (INDEPENDENT_AMBULATORY_CARE_PROVIDER_SITE_OTHER): Payer: Self-pay | Admitting: Vascular Surgery

## 2023-06-11 ENCOUNTER — Ambulatory Visit (INDEPENDENT_AMBULATORY_CARE_PROVIDER_SITE_OTHER): Payer: Medicare Other | Admitting: Vascular Surgery

## 2023-06-11 VITALS — BP 105/64 | HR 66 | Resp 16 | Wt 142.0 lb

## 2023-06-11 DIAGNOSIS — I83813 Varicose veins of bilateral lower extremities with pain: Secondary | ICD-10-CM

## 2023-06-11 DIAGNOSIS — E7800 Pure hypercholesterolemia, unspecified: Secondary | ICD-10-CM

## 2023-06-11 NOTE — Assessment & Plan Note (Signed)
lipid control important in reducing the progression of atherosclerotic disease. Continue repatha therapy

## 2023-06-11 NOTE — Assessment & Plan Note (Signed)
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  Patient should undergo injection sclerotherapy to treat the residual varicosities.  The risks, benefits and alternative therapies were reviewed in detail with the patient.  All questions were answered.  The patient agrees to proceed with sclerotherapy at their convenience.  The patient will continue wearing the graduated compression stockings and using the over-the-counter pain medications to treat her symptoms.     

## 2023-06-11 NOTE — Progress Notes (Signed)
MRN : [id number]  Taylor Hunt is a 73 y.o. (15-Feb-1951) female who presents with chief complaint of  Chief Complaint  Patient presents with   Follow-up    4 week post laser  .  History of Present Illness: Patient returns today in follow up of her venous disease.  She has undergone laser ablation of both great saphenous veins with improvement in her lower extremity swelling and heaviness.  She does still have prominent and tender varicosities on both lower extremities, a little worse on the right than the left.  Some of these course in the anterior excessively saphenous distribution under still at least 2 mm in diameter.  She also has extensive spider varicosities.  Her duplex on each side showed successful ablation without DVT and she did not have any periprocedural complications.  Current Outpatient Medications  Medication Sig Dispense Refill   acetaminophen (TYLENOL) 325 MG tablet Take 650 mg by mouth every 6 (six) hours as needed for moderate pain or headache.     ALPRAZolam (XANAX) 0.5 MG tablet Take 1 tab one hour before procedure and 1 tab when you arrive in office 2 tablet 0   B Complex-C (B-COMPLEX WITH VITAMIN C) tablet Take 1 tablet by mouth daily.      Calcium Carbonate-Vit D-Min (CALTRATE 600+D PLUS PO) Take by mouth 2 (two) times daily with a meal.     celecoxib (CELEBREX) 200 MG capsule TAKE 1 CAPSULE (200 MG TOTAL) BY MOUTH 2 (TWO) TIMES A DAY.     Certolizumab Pegol (CIMZIA PREFILLED [place]) Inject into the skin.     cyclobenzaprine (FLEXERIL) 10 MG tablet Take 10 mg by mouth at bedtime.     diclofenac sodium (VOLTAREN) 1 % GEL Apply 2 g topically 4 (four) times daily.      diltiazem (CARDIZEM CD) 240 MG 24 hr capsule Take 240 mg by mouth daily.     ezetimibe (ZETIA) 10 MG tablet Take 10 mg by mouth daily.     fluorouracil (EFUDEX) 5 % cream Apply to Tops of hands, top of elbows  twice a day for 7 days,Apply to the full nose and right lateral cheek  twice a day for 4 days 60 g  2   fluticasone (FLONASE) 50 MCG/ACT nasal spray Place 1 spray into both nostrils daily as needed for allergies or rhinitis.      hydrochlorothiazide (HYDRODIURIL) 25 MG tablet Take by mouth.     magnesium oxide (MAG-OX) 400 MG tablet Take 400 mg by mouth daily.     methotrexate (RHEUMATREX) 2.5 MG tablet Take by mouth.     metoprolol succinate (TOPROL-XL) 25 MG 24 hr tablet Take 25 mg by mouth daily.     metoprolol tartrate (LOPRESSOR) 100 MG tablet Take 1 tablet (100 mg total) by mouth once for 1 dose. Please take one time dose 100mg  metoprolol tartrate 2 hr prior to cardiac CT for HR control IF HR >55bpm. 1 tablet 0   montelukast (SINGULAIR) 10 MG tablet TAKE ONE TABLET BY MOUTH EVERY NIGHT AT BEDTIME     Multiple Vitamin (MULTIVITAMIN WITH MINERALS) TABS tablet Take 1 tablet by mouth daily.     mupirocin ointment (BACTROBAN) 2 % Apply 1 application topically daily. 22 g 1   ondansetron (ZOFRAN-ODT) 4 MG disintegrating tablet Take 1 tablet (4 mg total) by mouth every 8 (eight) hours as needed for nausea or vomiting. 20 tablet 0   REPATHA SURECLICK 140 MG/ML SOAJ Inject into the skin every  14 (fourteen) days.     tamsulosin (FLOMAX) 0.4 MG CAPS capsule Take 1 capsule (0.4 mg total) by mouth daily. 30 capsule 0   triamcinolone (KENALOG) 0.025 % ointment Apply twice daily to affected areas up to 5 days. 30 g 0   valACYclovir (VALTREX) 1000 MG tablet Take 2 tablets in the morning and 2 tablets in the evening for one day, as needed for flare     vitamin B-12 (CYANOCOBALAMIN) 1000 MCG tablet Take 1,000 mcg by mouth daily.      Zinc Citrate-Phytase 25-500 MG CAPS Take by mouth daily.     HYDROcodone-acetaminophen (NORCO/VICODIN) 5-325 MG tablet Take 1 tablet by mouth every 4 (four) hours as needed. 20 tablet 0   No current facility-administered medications for this visit.    Past Medical History:  Diagnosis Date   Anemia    Anxiety    Arthritis    Basal cell carcinoma 10/24/2020   right nasal  tip, Moh's 01/09/2021   BCC (basal cell carcinoma) 11/26/2021   Left lateral pretibia. EDC 12/24/2021   Cancer (HCC)    skin   CHF (congestive heart failure) (HCC)    GERD (gastroesophageal reflux disease)    Headache    Heart murmur    Hx of dysplastic nevus 10/15/2009   R dorsum 2nd toe, mild atypia   Hx of dysplastic nevus 11/07/2012   R mid anterior thigh, modterate atypia   Hyperlipemia    Hypothyroidism    Pneumonia    PONV (postoperative nausea and vomiting)    Squamous cell carcinoma of skin 09/15/2006   L forearm, excised   Squamous cell carcinoma of skin 03/21/2008   R lower leg, SCCIS excised   Squamous cell carcinoma of skin 07/19/2008   R forearm   Squamous cell carcinoma of skin 10/25/2012   L mid dorsum forearm   Squamous cell carcinoma of skin 01/24/2014   L posterior lower leg    Past Surgical History:  Procedure Laterality Date   ANTERIOR CERVICAL DECOMP/DISCECTOMY FUSION N/A 09/18/2015   Procedure: ANTERIOR CERVICAL DECOMPRESSION/DISCECTOMY INTERBODY FUSION PLATING BONEGRAFT CERVICAL FIVE-SIX ,CERVICAL SIX-SEVEN ;  Surgeon: Tressie Stalker, MD;  Location: [facility];  Service: Neurosurgery;  Laterality: N/A;   APPENDECTOMY     both shoulder surgery     BREAST EXCISIONAL BIOPSY Right    @ 2000 2 areas excised   CHOLECYSTECTOMY     COLONOSCOPY     COLONOSCOPY WITH PROPOFOL N/A 12/27/2019   Procedure: COLONOSCOPY WITH PROPOFOL;  Surgeon: Earline Mayotte, MD;  Location: [facility];  Service: Endoscopy;  Laterality: N/A;   EYE SURGERY     NEUROPLASTY / TRANSPOSITION ULNAR NERVE AT ELBOW  02/1996   OOPHORECTOMY     right knee surgery     skin cancer removed     TONSILLECTOMY       Social History   Tobacco Use   Smoking status: Never   Smokeless tobacco: Never  Vaping Use   Vaping status: Never Used  Substance Use Topics   Alcohol use: Yes    Comment: occcassionally    Drug use: No      Family History  Problem Relation Age of Onset    Pancreatic cancer Mother    Lung cancer Father    Pancreatic cancer Maternal Uncle    Pancreatic cancer Brother    Melanoma Maternal Grandfather    Breast cancer Neg Hx      Allergies  Allergen Reactions   Fentanyl Nausea  And Vomiting   Codeine Other (See Comments)    Dizziness, nausea, vomiting   Gabapentin Other (See Comments)   Hydrocodone Nausea And Vomiting   Lovastatin Other (See Comments)    Muscle pain   Morphine Hives   Tramadol Nausea And Vomiting     REVIEW OF SYSTEMS (Negative unless checked)   Constitutional: [] Weight loss  [] Fever  [] Chills Cardiac: [] Chest pain   [] Chest pressure   [] Palpitations   [] Shortness of breath when laying flat   [] Shortness of breath at rest   [] Shortness of breath with exertion. Vascular:  [x] Pain in legs with walking   [] Pain in legs at rest   [] Pain in legs when laying flat   [] Claudication   [] Pain in feet when walking  [] Pain in feet at rest  [] Pain in feet when laying flat   [] History of DVT   [] Phlebitis   [x] Swelling in legs   [x] Varicose veins   [] Non-healing ulcers Pulmonary:   [] Uses home oxygen   [] Productive cough   [] Hemoptysis   [] Wheeze  [] COPD   [] Asthma Neurologic:  [] Dizziness  [] Blackouts   [] Seizures   [] History of stroke   [] History of TIA  [] Aphasia   [] Temporary blindness   [] Dysphagia   [] Weakness or numbness in arms   [] Weakness or numbness in legs Musculoskeletal:  [x] Arthritis   [] Joint swelling   [x] Joint pain   [] Low back pain Hematologic:  [] Easy bruising  [] Easy bleeding   [] Hypercoagulable state   [] Anemic   Gastrointestinal:  [] Blood in stool   [] Vomiting blood  [] Gastroesophageal reflux/heartburn   [] Abdominal pain Genitourinary:  [] Chronic kidney disease   [] Difficult urination  [] Frequent urination  [] Burning with urination   [] Hematuria Skin:  [] Rashes   [] Ulcers   [] Wounds Psychological:  [x] History of anxiety   []  History of major depression.  Physical Examination  BP 105/64   Pulse 66   Resp  16   Wt 142 lb (64.4 kg)   BMI 23.63 kg/m  Gen:  WD/WN, NAD. Appears younger than stated age. Head: [place]/AT, No temporalis wasting. Ear/Nose/Throat: Hearing grossly intact, nares w/o erythema or drainage Eyes: Conjunctiva clear. Sclera non-icteric Neck: Supple.  Trachea midline Pulmonary:  Good air movement, no use of accessory muscles.  Cardiac: RRR, no JVD Vascular: Prominent varicosities are present bilaterally a little worse on the right than the left.  Largest are in the anterior accessory saphenous vein distribution.  She also has fairly prominent spider varicosities. Vessel Right Left  Radial Palpable Palpable                          PT Palpable Palpable  DP Palpable Palpable   Gastrointestinal: soft, non-tender/non-distended. No guarding/reflex.  Musculoskeletal: M/S 5/5 throughout.  No deformity or atrophy. Trace LE edema. Neurologic: Sensation grossly intact in extremities.  Symmetrical.  Speech is fluent.  Psychiatric: Judgment intact, Mood & affect appropriate for pt's clinical situation. Dermatologic: No rashes or ulcers noted.  No cellulitis or open wounds.      Labs Recent Results (from the past 2160 hours)  [facility] Molecular Screen - Blood ([facility])     Status: None   Collection Time: 05/03/23 10:03 AM  Result Value Ref Range   Genetic Analysis Overall Interpretation Negative    Genetic Disease Assessed      [facility] Tier One Population Screen is a screening test that analyzes 11 genes related to hereditary breast and ovarian cancer (HBOC) syndrome, Lynch  syndrome, and familial hypercholesterolemia. This test only reports clinically significant pathogenic and  likely pathogenic variants but does not report variants of uncertain significance (VUS). In addition, analysis of the PMS2 gene excludes exons 11-15, which overlap with a known pseudogene (PMS2CL).    Genetic Analysis Report      No pathogenic or likely pathogenic variants were  detected in the genes analyzed by this test.Genetic test results should be interpreted in the context of an individual's personal medical and family history. Alteration to medical management is NOT  recommended based solely on this result. Clinical correlation is advised.Additional Considerations- This is a screening test; individuals may still carry pathogenic or likely pathogenic variant(s) in the tested genes that are not detected by this test.-  For individuals at risk for these or other related conditions based on factors including personal or family history, diagnostic testing is recommended.- The absence of pathogenic or likely pathogenic variant(s) in the analyzed genes, while reassuring,  does not eliminate the possibility of a hereditary condition; there are other variants and genes associated with heart disease and hereditary cancer that are not included in this test.    Genes Tested See Notes     Comment: APOB, BRCA1, BRCA2, EPCAM, LDLR, LDLRAP1, PCSK9, PMS2, MLH1, MSH2, MSH6   Disclaimer See Notes     Comment: This test was developed and validated by [facility] This test has not been cleared or approved by the [place] (FDA). The [facility] laboratory is accredited by the College of American Pathologists (CAP) and certified under  the Clinical Laboratory Improvement Amendments (CLIA #: [id number]) to perform high-complexity clinical tests. This test is used for clinical purposes. It should not be regarded as investigational or for research.    Sequencing Location See Notes     Comment: Sequencing done at [facility] [address], [address], [place], [place] [address] (CLIA# [id number])   Interpretation Methods and Limitations See Notes     Comment: Extracted DNA is enriched for targeted regions and then sequenced using the [facility] Exome+ (R) assay on an Illumina DNA sequencing system. Data is then aligned to a modified version of GRCh38 and all genes are  analyzed using the MANE transcript and MANE  Plus Clinical transcript, when available. Small variant calling is completed using a customized version of [employer]'s DNAseq software, augmented by a proprietary small variant caller for difficult variants. Copy number variants (CNVs) are then called  using a proprietary bioinformatics pipeline based on depth analysis with a comparison to similarly sequenced samples. Analysis of the PMS2 gene is limited to exons 1-10. The interpretation and reporting of variants in APOB, PCSK9, and LDLR is specific to  familial hypercholesterolemia; variants associated with hypobetalipoproteinemia are not included. Interpretation is based upon guidelines published by the [employer] and [employer]) and the Association for  Molecular  Pathology (AMP) or their modification by [employer] when available. Interpretation is limited to the transcripts indicated on the report and +/- 10 bp into intronic regions, except as noted below. [facility] variant classifications  include pathogenic, likely pathogenic, variant of uncertain significance (VUS), likely benign, and benign. Only variants classified as pathogenic and likely pathogenic are included in the report. All reported variants are confirmed through secondary  manual inspection of DNA sequence data or orthogonal testing. Risk estimations and management guidelines included in this report are based on analysis of primary literature and recommendations of applicable professional societies, and  should be regarded  as approximations.Based on validation studies, this assay delivers > 99% sensitivity and specificity for single nucleotide variants and insertions and deletions (indels) up to 20 bp. Larger indels and complex variants are a lso reported but sensitivity  may be reduced. Based on validation studies, this assay delivers > 99% sensitivity to multi-exon CNVs and > 90% sensitivity  to single-exon CNVs. This test may not detect variants in challenging regions (such as short tandem repeats, homopolymer runs, and  segment duplications), sub-exonic CNVs, chromosomal aneuploidy, or variants in the presence of mosaicism. Phasing will be attempted and reported, when possible. Structural rearrangements such as inversions, translocations, and gene conversions are not  tested in this assay unless explicitly indicated. Additionally, deep intronic, promoter, and enhancer regions may not be covered. It is important to note that this is a screening test and cannot detect all disease-causing variants. A negative result does  not guarantee the absence of a rare, undetectable variant in the genes analyzed; consider using a diagnostic test if there is significant personal and/or family history of one of the conditions analyze d by this test. Any potential incidental findings  outside of these genes and conditions will not be identified, nor reported. The results of a genetic test may be influenced by various factors, including bone marrow transplantation, blood transfusions, or in rare cases, hematolymphoid neoplasms.Gene  Specific Notes:APOB: analysis is limited to c.10580G>A and c.10579C>T; BRCA1: sequencing analysis extends to CDS +/-20 bp; BRCA2: sequencing analysis extends to CDS +/-20 bp. EPCAM: analysis is limited to CNV of exons 8-9; LDLR: analysis includes CNV of  the promoter; MLH1: analysis includes CNV of the promoter; PMS2: analysis is limited to exons 1-10.Gardenia Phlegm, PhD, [email or web address]     Radiology VAS [place] LOWER EXTREMITY VENOUS POST ABLATION Result Date: 05/18/2023  Lower Venous Study Patient Name:  JUANITA STREIGHT Abramo  Date of Exam:   05/14/2023 Medical Rec #: [id number]   Accession #:    [id number] Date of Birth: Dec 15, 1950  Patient Gender: F Patient Age:   60 years Exam Location:  [facility] Procedure:      VAS [place] LASER ABLATION OF SUPERFICIAL VEIN Referring  Phys: Festus Barren --------------------------------------------------------------------------------  Indications: Varicosities. Other Indications: Post ablation left gsv. Performing Technologist: Salvadore Farber RVT  Examination Guidelines: A complete evaluation includes B-mode imaging, spectral Doppler, color Doppler, and power Doppler as needed of all accessible portions of each vessel. Bilateral testing is considered an integral part of a complete examination. Limited examinations for reoccurring indications may be performed as noted.  +------+---------------+---------+-----------+----------+--------------+ LEFT  CompressibilityPhasicitySpontaneityPropertiesThrombus Aging +------+---------------+---------+-----------+----------+--------------+ CFV   Full           Yes                                          +------+---------------+---------+-----------+----------+--------------+ FV MidFull           Yes                                          +------+---------------+---------+-----------+----------+--------------+ POP   Full           Yes                                          +------+---------------+---------+-----------+----------+--------------+  Summary: Left: No - DVT limited views. Left GSV closed post ablation.  *See table(s) above for measurements and observations. Electronically signed by Festus Barren MD on 05/18/2023 at 3:13:15 PM.    Final     Assessment/Plan  Varicose veins of bilateral lower extremities with pain Recommend:  The patient has had successful ablation of the previously incompetent saphenous venous system but still has persistent symptoms of pain and swelling that are having a negative impact on daily life and daily activities.  Patient should undergo injection sclerotherapy to treat the residual varicosities.  The risks, benefits and alternative therapies were reviewed in detail with the patient.  All questions were answered.  The patient agrees to  proceed with sclerotherapy at their convenience.  The patient will continue wearing the graduated compression stockings and using the over-the-counter pain medications to treat her symptoms.      Hypercholesteremia lipid control important in reducing the progression of atherosclerotic disease. Continue repatha therapy    Festus Barren, MD  06/11/2023 11:56 AM    This note was created with Dragon medical transcription system.  Any errors from dictation are purely unintentional

## 2023-07-14 ENCOUNTER — Encounter: Payer: Self-pay | Admitting: Dermatology

## 2023-07-14 ENCOUNTER — Ambulatory Visit (INDEPENDENT_AMBULATORY_CARE_PROVIDER_SITE_OTHER): Payer: Medicare Other | Admitting: Dermatology

## 2023-07-14 DIAGNOSIS — W908XXA Exposure to other nonionizing radiation, initial encounter: Secondary | ICD-10-CM

## 2023-07-14 DIAGNOSIS — Z79899 Other long term (current) drug therapy: Secondary | ICD-10-CM

## 2023-07-14 DIAGNOSIS — L814 Other melanin hyperpigmentation: Secondary | ICD-10-CM

## 2023-07-14 DIAGNOSIS — L570 Actinic keratosis: Secondary | ICD-10-CM | POA: Diagnosis not present

## 2023-07-14 DIAGNOSIS — I781 Nevus, non-neoplastic: Secondary | ICD-10-CM

## 2023-07-14 DIAGNOSIS — B009 Herpesviral infection, unspecified: Secondary | ICD-10-CM

## 2023-07-14 DIAGNOSIS — L578 Other skin changes due to chronic exposure to nonionizing radiation: Secondary | ICD-10-CM

## 2023-07-14 DIAGNOSIS — Z7189 Other specified counseling: Secondary | ICD-10-CM

## 2023-07-14 DIAGNOSIS — Z8619 Personal history of other infectious and parasitic diseases: Secondary | ICD-10-CM

## 2023-07-14 MED ORDER — VALACYCLOVIR HCL 500 MG PO TABS: ORAL_TABLET | ORAL | 1 refills | Status: DC

## 2023-07-14 NOTE — Patient Instructions (Addendum)

## 2023-07-14 NOTE — Progress Notes (Signed)
 Follow-Up Visit   Subjective  Taylor Hunt is a 73 y.o. female who presents for the following: for bbl treatment today for spots at face, patient also report   The patient has spots, moles and lesions to be evaluated, some may be new or changing and the patient may have concern these could be cancer.  The following portions of the chart were reviewed this encounter and updated as appropriate: medications, allergies, medical history  Review of Systems:  No other skin or systemic complaints except as noted in HPI or Assessment and Plan.  Objective  Well appearing patient in no apparent distress; mood and affect are within normal limits.  A focused examination was performed of the following areas: face Relevant exam findings are noted in the Assessment and Plan.                       Right Lower Vermilion Lip x 1, left lower lip vermillion x 1 (2) Erythematous thin papules/macules with gritty scale.   Assessment & Plan   LENTIGINES  Exam: scattered tan macules  Treatment Plan: BBL today  Laser starter kit and soothing gel mask given today   Laser safety: Patient was advised in laser safety.  Patient was fitted with laser safety goggles and advised to keep eyes closed during procedure with goggles on. Staff and provider ensured that patient and their own safety goggles were also on and eyes protected during procedure. Laser room door was secured and locked from the inside. Laser room door has laser safety sign affixed to the outside of the door.    [place] BBL - 07/14/23 1400      Patient Details   Skin Type: II    Anesthestic Cream Applied: No    Photo Takes: Yes    Consent Signed: Yes      Treatment Details   Date: 07/14/23    Treatment #: 1    Area: f    Filter: 1st Pass;2nd Pass      1st Pass   Location: F    Device: 515   pigmented lesions at cheeks, chin, upper lip, forehead and  temples   BBL j/cm2: 12    PW Msec Sec: 10    Cooling  Temp: 25    Pulses: 178    11mm: this one      2nd Pass   Location: F    Device: 560   vascular spots at nose   BBL j/cm2: 28    PW Msec Sec: 27    Cooling Temp: 20    Pulses: 35    7mm: this one      Patient tolerated the procedure well.   Wynelle Link avoidance was stressed. The patient will call with any problems, questions or concerns prior to their next appointment.   TELANGIECTASIA Exam: dilated blood vessel(s)  Treatment Plan: BBL treatment today  Laser safety: Patient was advised in laser safety.  Patient was fitted with laser safety goggles and advised to keep eyes closed during procedure with goggles on. Staff and provider ensured that patient and their own safety goggles were also on and eyes protected during procedure. Laser room door was secured and locked from the inside. Laser room door has laser safety sign affixed to the outside of the door.    [place] BBL - 07/14/23 1400      Patient Details   Skin Type: II    Anesthestic Cream Applied: No  Photo Takes: Yes    Consent Signed: Yes        2nd Pass   Location: F    Device: 560   vascular spots at nose   BBL j/cm2: 28    PW Msec Sec: 27    Cooling Temp: 20    Pulses: 35    7mm: this one    Patient tolerated the procedure well.   Wynelle Link avoidance was stressed. The patient will call with any problems, questions or concerns prior to their next appointment.  Hx of Fever blisters Valtrex Rx sent. She will start today after laser for prophylaxis Herpes Simplex Virus = Cold Sores = Fever Blisters is a chronic recurring blistering; scabbing sore-producing viral infection that is recurrent usually in the same area triggered by stress, sun/UV exposure and trauma.  It is infectious and can be spread from person to person by direct contact.  It is not curable, but is treatable with topical and oral medication.   ACTINIC DAMAGE - chronic, secondary to cumulative UV radiation exposure/sun exposure over time - diffuse  scaly erythematous macules with underlying dyspigmentation - Recommend daily broad spectrum sunscreen SPF 30+ to sun-exposed areas, reapply every 2 hours as needed.  - Recommend staying in the shade or wearing long sleeves, sun glasses (UVA+UVB protection) and wide brim hats (4-inch brim around the entire circumference of the hat). - Call for new or changing lesions.  ACTINIC KERATOSIS (2) Right Lower Vermilion Lip x 1, left lower lip vermillion x 1 (2) Will recheck both areas at next follow up  Actinic keratoses are precancerous spots that appear secondary to cumulative UV radiation exposure/sun exposure over time. They are chronic with expected duration over 1 year. A portion of actinic keratoses will progress to squamous cell carcinoma of the skin. It is not possible to reliably predict which spots will progress to skin cancer and so treatment is recommended to prevent development of skin cancer.  Recommend daily broad spectrum sunscreen SPF 30+ to sun-exposed areas, reapply every 2 hours as needed.  Recommend staying in the shade or wearing long sleeves, sun glasses (UVA+UVB protection) and wide brim hats (4-inch brim around the entire circumference of the hat). Call for new or changing lesions. Destruction of lesion - Right Lower Vermilion Lip x 1, left lower lip vermillion x 1 (2) Complexity: simple   Destruction method: cryotherapy   Informed consent: discussed and consent obtained   Timeout:  patient name, date of birth, surgical site, and procedure verified Lesion destroyed using liquid nitrogen: Yes   Region frozen until ice ball extended beyond lesion: Yes   Outcome: patient tolerated procedure well with no complications   Post-procedure details: wound care instructions given   ACTINIC SKIN DAMAGE   LENTIGO   TELANGIECTASIA   HSV (HERPES SIMPLEX VIRUS) INFECTION    No follow-ups on file.  IAsher Muir, CMA, am acting as scribe for Armida Sans,  MD.   Documentation: I have reviewed the above documentation for accuracy and completeness, and I agree with the above.  Armida Sans, MD

## 2023-08-18 ENCOUNTER — Ambulatory Visit: Admitting: Dermatology

## 2023-08-26 ENCOUNTER — Ambulatory Visit (INDEPENDENT_AMBULATORY_CARE_PROVIDER_SITE_OTHER): Admitting: Dermatology

## 2023-08-26 DIAGNOSIS — I781 Nevus, non-neoplastic: Secondary | ICD-10-CM

## 2023-08-26 DIAGNOSIS — L814 Other melanin hyperpigmentation: Secondary | ICD-10-CM | POA: Diagnosis not present

## 2023-08-26 DIAGNOSIS — L820 Inflamed seborrheic keratosis: Secondary | ICD-10-CM | POA: Diagnosis not present

## 2023-08-26 DIAGNOSIS — W908XXA Exposure to other nonionizing radiation, initial encounter: Secondary | ICD-10-CM

## 2023-08-26 DIAGNOSIS — L578 Other skin changes due to chronic exposure to nonionizing radiation: Secondary | ICD-10-CM

## 2023-08-26 DIAGNOSIS — Z85828 Personal history of other malignant neoplasm of skin: Secondary | ICD-10-CM

## 2023-08-26 DIAGNOSIS — L821 Other seborrheic keratosis: Secondary | ICD-10-CM

## 2023-08-26 DIAGNOSIS — Z8589 Personal history of malignant neoplasm of other organs and systems: Secondary | ICD-10-CM

## 2023-08-26 DIAGNOSIS — Z8619 Personal history of other infectious and parasitic diseases: Secondary | ICD-10-CM

## 2023-08-26 DIAGNOSIS — L570 Actinic keratosis: Secondary | ICD-10-CM

## 2023-08-26 NOTE — Patient Instructions (Addendum)

## 2023-08-26 NOTE — Progress Notes (Signed)
 Follow-Up Visit   Subjective  Taylor Hunt is a 73 y.o. female who presents for the following: here for bbl treatment to treat vascular and browns at face. Patient also reports some spots at left arm, face, nose, and legs she would like checked.   The patient has spots, moles and lesions to be evaluated, some may be new or changing and the patient may have concern these could be cancer.  The following portions of the chart were reviewed this encounter and updated as appropriate: medications, allergies, medical history  Review of Systems:  No other skin or systemic complaints except as noted in HPI or Assessment and Plan.           Objective  Well appearing patient in no apparent distress; mood and affect are within normal limits.  A focused examination was performed of the following areas: B/l arms, face, b/l hands, b/l legs,   Relevant exam findings are noted in the Assessment and Plan.  b/l arms b/l legs x 19 (19) Erythematous stuck-on, waxy papule or plaque b/l legs and face x 16 (16) Erythematous thin papules/macules with gritty scale.   Assessment & Plan   LENTIGINES and Actinic Changes of FACE Exam: scattered tan macules at face   Treatment Plan: BBL treatment today   Laser safety: Patient was advised in laser safety.  Patient was fitted with laser safety goggles and advised to keep eyes closed during procedure with goggles on. Staff and provider ensured that patient and their own safety goggles were also on and eyes protected during procedure. Laser room door was secured and locked from the inside. Laser room door has laser safety sign affixed to the outside of the door.    [place] BBL - 08/26/23 1700      Patient Details   Skin Type: II    Anesthestic Cream Applied: No    Photo Takes: Yes    Consent Signed: Yes    Improvement from Previous Treatment: Yes      Treatment Details   Date: 08/26/23    Treatment #: 2    Area: f    Filter: 1st Pass;2nd  Pass;3rd Pass      1st Pass   Location: F   temples, cheeks, nose, upper lip, forehead, chin   Device: 515   pigmented lesion forehead, temples, upper lip, chin,  b/l  cheeks   BBL j/cm2: 15    PW Msec Sec: 15    Cooling Temp: 15    Pulses: 55    11mm: this one    7mm: --      2nd Pass   Location: F   forehead, cheeks, temples, nose, upper lip, chin   Device: 515   pigmented lesions at nose and upper lip   BBL j/cm2: 12    PW Msec Sec: 10    Cooling Temp: 25    Pulses: 143    15x15: this one    11mm: --    Patient tolerated the procedure well.   Paulene Boron avoidance was stressed. The patient will call with any problems, questions or concerns prior to their next appointment.   TELANGIECTASIAs of infranasal area of FACE Exam: dilated blood vessel at upper lip and nose  Treatment Plan: BBL treatment today   Laser safety: Patient was advised in laser safety.  Patient was fitted with laser safety goggles and advised to keep eyes closed during procedure with goggles on. Staff and provider ensured that patient and their own  safety goggles were also on and eyes protected during procedure. Laser room door was secured and locked from the inside. Laser room door has laser safety sign affixed to the outside of the door.     [place] BBL - 08/26/23 1700      Patient Details   Skin Type: II    Anesthestic Cream Applied: No    Photo Takes: Yes    Consent Signed: Yes    Improvement from Previous Treatment: Yes       3rd Pass   Location: F   nose, upper lip   Device: 560   vascular for upper lip at junction   BBL j/cm2: 28    PW Msec Sec: 26    Cooling Temp: 20    Pulses: 67    7mm: this one     Patient tolerated the procedure well.   Paulene Boron avoidance was stressed. The patient will call with any problems, questions or concerns prior to their next appointment.    Hx of Fever blisters Valtrex  Rx sent. Patient will start today after laser for prophylaxis Herpes Simplex Virus =  Cold Sores = Fever Blisters is a chronic recurring blistering; scabbing sore-producing viral infection that is recurrent usually in the same area triggered by stress, sun/UV exposure and trauma.  It is infectious and can be spread from person to person by direct contact.  It is not curable, but is treatable with topical and oral medication.  SEBORRHEIC KERATOSIS - Stuck-on, waxy, tan-brown papules and/or plaques  - Benign-appearing - Discussed benign etiology and prognosis. - Observe - Call for any changes  ACTINIC DAMAGE - chronic, secondary to cumulative UV radiation exposure/sun exposure over time - diffuse scaly erythematous macules with underlying dyspigmentation - Recommend daily broad spectrum sunscreen SPF 30+ to sun-exposed areas, reapply every 2 hours as needed.  - Recommend staying in the shade or wearing long sleeves, sun glasses (UVA+UVB protection) and wide brim hats (4-inch brim around the entire circumference of the hat). - Call for new or changing lesions.  History of RA  Treatment Plan: Currently on Immunosuppressants   Methotrexate and Cimzia  Discussed could develop other skin cancers.   HISTORY OF SQUAMOUS CELL CARCINOMA OF THE SKIN - No evidence of recurrence today - No lymphadenopathy - Recommend regular full body skin exams - Recommend daily broad spectrum sunscreen SPF 30+ to sun-exposed areas, reapply every 2 hours as needed.  - Call if any new or changing lesions are noted between office visits  INFLAMED SEBORRHEIC KERATOSIS (19) b/l arms b/l legs x 19 (19) Symptomatic, irritating, patient would like treated. Destruction of lesion - b/l arms b/l legs x 19 (19) Complexity: simple   Destruction method: cryotherapy   Informed consent: discussed and consent obtained   Timeout:  patient name, date of birth, surgical site, and procedure verified Lesion destroyed using liquid nitrogen: Yes   Region frozen until ice ball extended beyond lesion: Yes   Outcome:  patient tolerated procedure well with no complications   Post-procedure details: wound care instructions given   ACTINIC KERATOSIS (16) b/l legs and face x 16 (16) Actinic keratoses are precancerous spots that appear secondary to cumulative UV radiation exposure/sun exposure over time. They are chronic with expected duration over 1 year. A portion of actinic keratoses will progress to squamous cell carcinoma of the skin. It is not possible to reliably predict which spots will progress to skin cancer and so treatment is recommended to prevent development of skin cancer.  Recommend daily broad spectrum sunscreen SPF 30+ to sun-exposed areas, reapply every 2 hours as needed.  Recommend staying in the shade or wearing long sleeves, sun glasses (UVA+UVB protection) and wide brim hats (4-inch brim around the entire circumference of the hat). Call for new or changing lesions. Destruction of lesion - b/l legs and face x 16 (16) Complexity: simple   Destruction method: cryotherapy   Informed consent: discussed and consent obtained   Timeout:  patient name, date of birth, surgical site, and procedure verified Lesion destroyed using liquid nitrogen: Yes   Region frozen until ice ball extended beyond lesion: Yes   Outcome: patient tolerated procedure well with no complications   Post-procedure details: wound care instructions given    No follow-ups on file.  IRandee Busing, CMA, am acting as scribe for Celine Collard, MD.   Documentation: I have reviewed the above documentation for accuracy and completeness, and I agree with the above.  Celine Collard, MD

## 2023-08-30 ENCOUNTER — Telehealth: Payer: Self-pay

## 2023-08-30 ENCOUNTER — Encounter: Payer: Self-pay | Admitting: Dermatology

## 2023-08-30 NOTE — Telephone Encounter (Signed)
 Called to see how patient was feeling after recent BBl treatment. Patient states she is doing well. Noticed some darkening of spots at face but as to be expected. No concerns and denies questions. States overall pleased with treatment.

## 2023-09-14 ENCOUNTER — Ambulatory Visit (INDEPENDENT_AMBULATORY_CARE_PROVIDER_SITE_OTHER): Admitting: Vascular Surgery

## 2023-09-16 ENCOUNTER — Ambulatory Visit: Payer: Medicare Other | Admitting: Dermatology

## 2023-09-17 ENCOUNTER — Ambulatory Visit (INDEPENDENT_AMBULATORY_CARE_PROVIDER_SITE_OTHER): Admitting: Vascular Surgery

## 2023-09-17 ENCOUNTER — Encounter (INDEPENDENT_AMBULATORY_CARE_PROVIDER_SITE_OTHER): Payer: Self-pay | Admitting: Vascular Surgery

## 2023-09-17 VITALS — BP 107/61 | HR 65 | Resp 16

## 2023-09-17 DIAGNOSIS — I83813 Varicose veins of bilateral lower extremities with pain: Secondary | ICD-10-CM | POA: Diagnosis not present

## 2023-09-17 NOTE — Progress Notes (Signed)
 Taylor Hunt is a 73 y.o.female who presents with painful varicose veins of the right leg  Past Medical History:  Diagnosis Date   Anemia    Anxiety    Arthritis    Basal cell carcinoma 10/24/2020   right nasal tip, Moh's 01/09/2021   BCC (basal cell carcinoma) 11/26/2021   Left lateral pretibia. EDC 12/24/2021   Cancer (HCC)    skin   CHF (congestive heart failure) (HCC)    GERD (gastroesophageal reflux disease)    Headache    Heart murmur    Hx of dysplastic nevus 10/15/2009   R dorsum 2nd toe, mild atypia   Hx of dysplastic nevus 11/07/2012   R mid anterior thigh, modterate atypia   Hyperlipemia    Hypothyroidism    Pneumonia    PONV (postoperative nausea and vomiting)    Squamous cell carcinoma of skin 09/15/2006   L forearm, excised   Squamous cell carcinoma of skin 03/21/2008   R lower leg, SCCIS excised   Squamous cell carcinoma of skin 07/19/2008   R forearm   Squamous cell carcinoma of skin 10/25/2012   L mid dorsum forearm   Squamous cell carcinoma of skin 01/24/2014   L posterior lower leg    Past Surgical History:  Procedure Laterality Date   ANTERIOR CERVICAL DECOMP/DISCECTOMY FUSION N/A 09/18/2015   Procedure: ANTERIOR CERVICAL DECOMPRESSION/DISCECTOMY INTERBODY FUSION PLATING BONEGRAFT CERVICAL FIVE-SIX ,CERVICAL SIX-SEVEN ;  Surgeon: Garry Kansas, MD;  Location: [facility];  Service: Neurosurgery;  Laterality: N/A;   APPENDECTOMY     both shoulder surgery     BREAST EXCISIONAL BIOPSY Right    @ 2000 2 areas excised   CHOLECYSTECTOMY     COLONOSCOPY     COLONOSCOPY WITH PROPOFOL  N/A 12/27/2019   Procedure: COLONOSCOPY WITH PROPOFOL ;  Surgeon: Marshall Skeeter, MD;  Location: [facility];  Service: Endoscopy;  Laterality: N/A;   EYE SURGERY     NEUROPLASTY / TRANSPOSITION ULNAR NERVE AT ELBOW  02/1996   OOPHORECTOMY     right knee surgery     skin cancer removed     TONSILLECTOMY      Current Outpatient Medications  Medication Sig Dispense  Refill   acetaminophen  (TYLENOL ) 325 MG tablet Take 650 mg by mouth every 6 (six) hours as needed for moderate pain or headache.     B Complex-C (B-COMPLEX WITH VITAMIN C) tablet Take 1 tablet by mouth daily.      Calcium  Carbonate-Vit D-Min (CALTRATE 600+D PLUS PO) Take by mouth 2 (two) times daily with a meal.     celecoxib  (CELEBREX ) 200 MG capsule TAKE 1 CAPSULE (200 MG TOTAL) BY MOUTH 2 (TWO) TIMES A DAY.     Certolizumab Pegol (CIMZIA PREFILLED [place]) Inject into the skin.     cyclobenzaprine  (FLEXERIL ) 10 MG tablet Take 10 mg by mouth at bedtime.     diclofenac  sodium (VOLTAREN ) 1 % GEL Apply 2 g topically 4 (four) times daily.      diltiazem (CARDIZEM CD) 240 MG 24 hr capsule Take 240 mg by mouth daily.     ezetimibe (ZETIA) 10 MG tablet Take 10 mg by mouth daily.     fluorouracil  (EFUDEX ) 5 % cream Apply to Tops of hands, top of elbows  twice a day for 7 days,Apply to the full nose and right lateral cheek  twice a day for 4 days 60 g 2   fluticasone  (FLONASE ) 50 MCG/ACT nasal spray Place 1 spray into both  nostrils daily as needed for allergies or rhinitis.      hydrochlorothiazide (HYDRODIURIL) 25 MG tablet Take by mouth.     magnesium  oxide (MAG-OX) 400 MG tablet Take 400 mg by mouth daily.     methotrexate (RHEUMATREX) 2.5 MG tablet Take by mouth.     metoprolol  succinate (TOPROL -XL) 25 MG 24 hr tablet Take 25 mg by mouth daily.     montelukast  (SINGULAIR ) 10 MG tablet TAKE ONE TABLET BY MOUTH EVERY NIGHT AT BEDTIME     Multiple Vitamin (MULTIVITAMIN WITH MINERALS) TABS tablet Take 1 tablet by mouth daily.     mupirocin  ointment (BACTROBAN ) 2 % Apply 1 application topically daily. 22 g 1   ondansetron  (ZOFRAN -ODT) 4 MG disintegrating tablet Take 1 tablet (4 mg total) by mouth every 8 (eight) hours as needed for nausea or vomiting. 20 tablet 0   REPATHA SURECLICK 140 MG/ML SOAJ Inject into the skin every 14 (fourteen) days.     tamsulosin  (FLOMAX ) 0.4 MG CAPS capsule Take 1 capsule (0.4  mg total) by mouth daily. 30 capsule 0   triamcinolone  (KENALOG ) 0.025 % ointment Apply twice daily to affected areas up to 5 days. 30 g 0   valACYclovir  (VALTREX ) 500 MG tablet Take 500 mg po bid for 7 days, starting 1 day prior to procedure 14 tablet 1   vitamin B-12 (CYANOCOBALAMIN ) 1000 MCG tablet Take 1,000 mcg by mouth daily.      Zinc  Citrate-Phytase 25-500 MG CAPS Take by mouth daily.     ALPRAZolam  (XANAX ) 0.5 MG tablet Take 1 tab one hour before procedure and 1 tab when you arrive in office 2 tablet 0   valACYclovir  (VALTREX ) 1000 MG tablet Take 2 tablets in the morning and 2 tablets in the evening for one day, as needed for flare     No current facility-administered medications for this visit.    Allergies  Allergen Reactions   Fentanyl  Nausea And Vomiting   Codeine Other (See Comments)    Dizziness, nausea, vomiting   Gabapentin Other (See Comments)   Hydrocodone  Nausea And Vomiting   Lovastatin Other (See Comments)    Muscle pain   Morphine Hives   Tramadol Nausea And Vomiting    Indication: Patient presents with symptomatic varicose veins of the right lower extremity.  Procedure: Foam sclerotherapy was performed on the right lower extremity. Using ultrasound guidance, 5 mL of foam Sotradecol was used to inject the varicosities of the right lower extremity. Compression wraps were placed. The patient tolerated the procedure well.

## 2023-09-28 ENCOUNTER — Encounter (INDEPENDENT_AMBULATORY_CARE_PROVIDER_SITE_OTHER): Payer: Self-pay

## 2023-11-02 ENCOUNTER — Encounter (INDEPENDENT_AMBULATORY_CARE_PROVIDER_SITE_OTHER): Payer: Self-pay | Admitting: Nurse Practitioner

## 2023-11-02 ENCOUNTER — Ambulatory Visit (INDEPENDENT_AMBULATORY_CARE_PROVIDER_SITE_OTHER): Admitting: Nurse Practitioner

## 2023-11-02 VITALS — BP 104/61 | HR 71 | Ht 65.0 in | Wt 140.0 lb

## 2023-11-02 DIAGNOSIS — I83813 Varicose veins of bilateral lower extremities with pain: Secondary | ICD-10-CM

## 2023-11-02 NOTE — Progress Notes (Signed)
 Varicose veins of right  lower extremity with inflammation (454.1  I83.10) Current Plans   Indication: Patient presents with symptomatic varicose veins of the right  lower extremity.   Procedure: Sclerotherapy using hypertonic saline mixed with 1% Lidocaine was performed on the right lower extremity. Compression wraps were placed. The patient tolerated the procedure well.

## 2023-11-30 ENCOUNTER — Ambulatory Visit: Admitting: Dermatology

## 2023-11-30 ENCOUNTER — Other Ambulatory Visit

## 2023-11-30 DIAGNOSIS — L578 Other skin changes due to chronic exposure to nonionizing radiation: Secondary | ICD-10-CM

## 2023-11-30 DIAGNOSIS — D229 Melanocytic nevi, unspecified: Secondary | ICD-10-CM

## 2023-11-30 DIAGNOSIS — L814 Other melanin hyperpigmentation: Secondary | ICD-10-CM | POA: Diagnosis not present

## 2023-11-30 DIAGNOSIS — Z79899 Other long term (current) drug therapy: Secondary | ICD-10-CM

## 2023-11-30 DIAGNOSIS — S50862A Insect bite (nonvenomous) of left forearm, initial encounter: Secondary | ICD-10-CM

## 2023-11-30 DIAGNOSIS — L820 Inflamed seborrheic keratosis: Secondary | ICD-10-CM | POA: Diagnosis not present

## 2023-11-30 DIAGNOSIS — D1801 Hemangioma of skin and subcutaneous tissue: Secondary | ICD-10-CM

## 2023-11-30 DIAGNOSIS — W908XXA Exposure to other nonionizing radiation, initial encounter: Secondary | ICD-10-CM

## 2023-11-30 DIAGNOSIS — W57XXXA Bitten or stung by nonvenomous insect and other nonvenomous arthropods, initial encounter: Secondary | ICD-10-CM

## 2023-11-30 DIAGNOSIS — L570 Actinic keratosis: Secondary | ICD-10-CM

## 2023-11-30 DIAGNOSIS — Z86018 Personal history of other benign neoplasm: Secondary | ICD-10-CM

## 2023-11-30 DIAGNOSIS — L821 Other seborrheic keratosis: Secondary | ICD-10-CM

## 2023-11-30 DIAGNOSIS — I781 Nevus, non-neoplastic: Secondary | ICD-10-CM

## 2023-11-30 DIAGNOSIS — S40862A Insect bite (nonvenomous) of left upper arm, initial encounter: Secondary | ICD-10-CM

## 2023-11-30 DIAGNOSIS — Z7189 Other specified counseling: Secondary | ICD-10-CM

## 2023-11-30 DIAGNOSIS — Z85828 Personal history of other malignant neoplasm of skin: Secondary | ICD-10-CM

## 2023-11-30 DIAGNOSIS — Z8589 Personal history of malignant neoplasm of other organs and systems: Secondary | ICD-10-CM

## 2023-11-30 DIAGNOSIS — Z1283 Encounter for screening for malignant neoplasm of skin: Secondary | ICD-10-CM | POA: Diagnosis not present

## 2023-11-30 DIAGNOSIS — I8393 Asymptomatic varicose veins of bilateral lower extremities: Secondary | ICD-10-CM

## 2023-11-30 MED ORDER — MUPIROCIN 2 % EX OINT: 1.0000 | TOPICAL_OINTMENT | Freq: Every day | CUTANEOUS | 0 refills | Status: DC

## 2023-11-30 MED ORDER — CLOBETASOL PROPIONATE 0.05 % EX CREA
TOPICAL_CREAM | CUTANEOUS | 0 refills | Status: AC
Start: 2023-11-30 — End: ?

## 2023-11-30 NOTE — Progress Notes (Unsigned)
 Follow-Up Visit   Subjective  Taylor Hunt is a 73 y.o. female who presents for the following: Skin Cancer Screening and Full Body Skin Exam Hx of aks and isks  Stung by wasp Sunday at left upper arm Arm is swollen and has blisters Taking benadryl and using otc hydrocortisone  Spot at top of nose used the 5 f/u cream and keeps  Tip of nose Scaly places behind right calf   The patient presents for Total-Body Skin Exam (TBSE) for skin cancer screening and mole check. The patient has spots, moles and lesions to be evaluated, some may be new or changing and the patient may have concern these could be cancer.  The following portions of the chart were reviewed this encounter and updated as appropriate: medications, allergies, medical history  Review of Systems:  No other skin or systemic complaints except as noted in HPI or Assessment and Plan.  Objective  Well appearing patient in no apparent distress; mood and affect are within normal limits.  A full examination was performed including scalp, head, eyes, ears, nose, lips, neck, chest, axillae, abdomen, back, buttocks, bilateral upper extremities, bilateral lower extremities, hands, feet, fingers, toes, fingernails, and toenails. All findings within normal limits unless otherwise noted below.   Relevant physical exam findings are noted in the Assessment and Plan.     Nose x 1, left pretibial x 3 (4) Erythematous thin papules/macules with gritty scale.  b/l legs x 32 (32) Erythematous stuck-on, waxy papule or plaque  Assessment & Plan   SKIN CANCER SCREENING PERFORMED TODAY.  ACTINIC DAMAGE - Chronic condition, secondary to cumulative UV/sun exposure - diffuse scaly erythematous macules with underlying dyspigmentation - Recommend daily broad spectrum sunscreen SPF 30+ to sun-exposed areas, reapply every 2 hours as needed.  - Staying in the shade or wearing long sleeves, sun glasses (UVA+UVB protection) and wide brim hats  (4-inch brim around the entire circumference of the hat) are also recommended for sun protection.  - Call for new or changing lesions.  LENTIGINES, SEBORRHEIC KERATOSES, HEMANGIOMAS - Benign normal skin lesions - Benign-appearing - Call for any changes  MELANOCYTIC NEVI - Tan-brown and/or pink-flesh-colored symmetric macules and papules - Benign appearing on exam today - Observation - Call clinic for new or changing moles - Recommend daily use of broad spectrum spf 30+ sunscreen to sun-exposed areas.  Varicose Veins/Spider Veins - Dilated blue, purple or red veins at the lower extremities - Reassured - Smaller vessels can be treated by sclerotherapy (a procedure to inject a medicine into the veins to make them disappear) if desired, but the treatment is not covered by insurance. Larger vessels may be covered if symptomatic and we would refer to vascular surgeon if treatment desired.   Bite reaction at left forearm by wasp on Sunday  Exam: see photos --edema erythema and bulla formation Treatment Plan: Contact derm by wasp bite   Recommend ice and elevation for swelling  Antihistamines by mouth daily (allegra clariten or zyrtec) Can take up to 4 tab by mouth daily. Start clobetasol  cream - apply topically to rash areas twice daily until calm.  Avoid applying to face, groin, and axilla. Use as directed.   Topical steroids (such as triamcinolone , fluocinolone, fluocinonide, mometasone , clobetasol , halobetasol, betamethasone , hydrocortisone) can cause thinning and lightening of the skin if they are used for too long in the same area. Your physician has selected the right strength medicine for your problem and area affected on the body. Please use your  medication only as directed by your physician to prevent side effects.   Start mupirocin  2 % ointment - apply topically to any open wounds daily until healed Recommend motrin  by mouth daily as needed for pain   HISTORY OF SQUAMOUS CELL  CARCINOMA OF THE SKIN 01/24/2014 left posterior lower leg 10/25/2012 left mid dorsum forearm  07/19/2008 right forearm 09/15/2006 left forearm excised - No evidence of recurrence today - No lymphadenopathy - Recommend regular full body skin exams - Recommend daily broad spectrum sunscreen SPF 30+ to sun-exposed areas, reapply every 2 hours as needed.  - Call if any new or changing lesions are noted between office visits  HISTORY OF SQUAMOUS CELL CARCINOMA IN SITU OF THE SKIN 03/21/2008 right lower leg excised  - No evidence of recurrence today - Recommend regular full body skin exams - Recommend daily broad spectrum sunscreen SPF 30+ to sun-exposed areas, reapply every 2 hours as needed.  - Call if any new or changing lesions are noted between office visits  HISTORY OF BASAL CELL CARCINOMA OF THE SKIN 11/26/2021 left lateral pretibia - ED&C  12/24/2021 10/24/2020 right nasal tip - Mohs 01/09/2021 - No evidence of recurrence today - Recommend regular full body skin exams - Recommend daily broad spectrum sunscreen SPF 30+ to sun-exposed areas, reapply every 2 hours as needed.  - Call if any new or changing lesions are noted between office visits  HISTORY OF DYSPLASTIC NEVUS 10/15/2009 right dorsum 2nd toe mild 11/07/2012 right mid anterior thigh moderate No evidence of recurrence today Recommend regular full body skin exams Recommend daily broad spectrum sunscreen SPF 30+ to sun-exposed areas, reapply every 2 hours as needed.  Call if any new or changing lesions are noted between office visits  INSECT BITE OF LEFT UPPER ARM WITH LOCAL REACTION, INITIAL ENCOUNTER   Related Medications mupirocin  ointment (BACTROBAN ) 2 % Apply 1 Application topically daily. Apply to any open wounds daily until healed clobetasol  cream (TEMOVATE ) 0.05 % Apply topically to rash twice daily until resolved. Avoid applying to face, groin, and axilla. Use as directed. ACTINIC KERATOSIS (4) Nose x 1, left pretibial  x 3 (4) Actinic keratoses are precancerous spots that appear secondary to cumulative UV radiation exposure/sun exposure over time. They are chronic with expected duration over 1 year. A portion of actinic keratoses will progress to squamous cell carcinoma of the skin. It is not possible to reliably predict which spots will progress to skin cancer and so treatment is recommended to prevent development of skin cancer.  Recommend daily broad spectrum sunscreen SPF 30+ to sun-exposed areas, reapply every 2 hours as needed.  Recommend staying in the shade or wearing long sleeves, sun glasses (UVA+UVB protection) and wide brim hats (4-inch brim around the entire circumference of the hat). Call for new or changing lesions. Destruction of lesion - Nose x 1, left pretibial x 3 (4) Complexity: simple   Destruction method: cryotherapy   Informed consent: discussed and consent obtained   Timeout:  patient name, date of birth, surgical site, and procedure verified Lesion destroyed using liquid nitrogen: Yes   Region frozen until ice ball extended beyond lesion: Yes   Outcome: patient tolerated procedure well with no complications   Post-procedure details: wound care instructions given    INFLAMED SEBORRHEIC KERATOSIS (32) b/l legs x 32 (32) Symptomatic, irritating, patient would like treated. Destruction of lesion - b/l legs x 32 (32) Complexity: simple   Destruction method: cryotherapy   Informed consent: discussed and consent obtained  Timeout:  patient name, date of birth, surgical site, and procedure verified Lesion destroyed using liquid nitrogen: Yes   Region frozen until ice ball extended beyond lesion: Yes   Outcome: patient tolerated procedure well with no complications   Post-procedure details: wound care instructions given    ACTINIC SKIN DAMAGE   HISTORY OF SQUAMOUS CELL CARCINOMA   SKIN CANCER SCREENING   LENTIGO   MELANOCYTIC NEVUS, UNSPECIFIED LOCATION   INSECT BITE OF  LEFT UPPER ARM, INITIAL ENCOUNTER   COUNSELING AND COORDINATION OF CARE   MEDICATION MANAGEMENT   ROSACEA   HISTORY OF BASAL CELL CARCINOMA   HISTORY OF DYSPLASTIC NEVUS   Return in about 6 months (around 06/01/2024) for TBSE.  IEleanor Blush, CMA, am acting as scribe for Alm Rhyme, MD.   Documentation: I have reviewed the above documentation for accuracy and completeness, and I agree with the above.  Alm Rhyme, MD

## 2023-11-30 NOTE — Patient Instructions (Addendum)
 For bite reaction at left arm  Recommend ice and elevation for swelling  Antihistamines by mouth daily (allegra clariten or zyrtec) Can take up to 4 tab by mouth daily. Start clobetasol  cream - apply topically to rash areas twice daily until calm.  Avoid applying to face, groin, and axilla. Use as directed.   Topical steroids (such as triamcinolone , fluocinolone, fluocinonide, mometasone , clobetasol , halobetasol, betamethasone , hydrocortisone) can cause thinning and lightening of the skin if they are used for too long in the same area. Your physician has selected the right strength medicine for your problem and area affected on the body. Please use your medication only as directed by your physician to prevent side effects.   Start mupirocin  2 % ointment - apply topically to any open wounds daily until healed  Recommend motrin  by mouth daily as needed for pain    Actinic keratoses are precancerous spots that appear secondary to cumulative UV radiation exposure/sun exposure over time. They are chronic with expected duration over 1 year. A portion of actinic keratoses will progress to squamous cell carcinoma of the skin. It is not possible to reliably predict which spots will progress to skin cancer and so treatment is recommended to prevent development of skin cancer.  Recommend daily broad spectrum sunscreen SPF 30+ to sun-exposed areas, reapply every 2 hours as needed.  Recommend staying in the shade or wearing long sleeves, sun glasses (UVA+UVB protection) and wide brim hats (4-inch brim around the entire circumference of the hat). Call for new or changing lesions.   Cryotherapy Aftercare  Wash gently with soap and water everyday.   Apply Vaseline and Band-Aid daily until healed.   Seborrheic Keratosis  What causes seborrheic keratoses? Seborrheic keratoses are harmless, common skin growths that first appear during adult life.  As time goes by, more growths appear.  Some people may  develop a large number of them.  Seborrheic keratoses appear on both covered and uncovered body parts.  They are not caused by sunlight.  The tendency to develop seborrheic keratoses can be inherited.  They vary in color from skin-colored to gray, brown, or even black.  They can be either smooth or have a rough, warty surface.   Seborrheic keratoses are superficial and look as if they were stuck on the skin.  Under the microscope this type of keratosis looks like layers upon layers of skin.  That is why at times the top layer may seem to fall off, but the rest of the growth remains and re-grows.    Treatment Seborrheic keratoses do not need to be treated, but can easily be removed in the office.  Seborrheic keratoses often cause symptoms when they rub on clothing or jewelry.  Lesions can be in the way of shaving.  If they become inflamed, they can cause itching, soreness, or burning.  Removal of a seborrheic keratosis can be accomplished by freezing, burning, or surgery. If any spot bleeds, scabs, or grows rapidly, please return to have it checked, as these can be an indication of a skin cancer.    Melanoma ABCDEs  Melanoma is the most dangerous type of skin cancer, and is the leading cause of death from skin disease.  You are more likely to develop melanoma if you: Have light-colored skin, light-colored eyes, or red or blond hair Spend a lot of time in the sun Tan regularly, either outdoors or in a tanning bed Have had blistering sunburns, especially during childhood Have a close family member who has  had a melanoma Have atypical moles or large birthmarks  Early detection of melanoma is key since treatment is typically straightforward and cure rates are extremely high if we catch it early.   The first sign of melanoma is often a change in a mole or a new dark spot.  The ABCDE system is a way of remembering the signs of melanoma.  A for asymmetry:  The two halves do not match. B for border:   The edges of the growth are irregular. C for color:  A mixture of colors are present instead of an even brown color. D for diameter:  Melanomas are usually (but not always) greater than 6mm - the size of a pencil eraser. E for evolution:  The spot keeps changing in size, shape, and color.  Please check your skin once per month between visits. You can use a small mirror in front and a large mirror behind you to keep an eye on the back side or your body.   If you see any new or changing lesions before your next follow-up, please call to schedule a visit.  Please continue daily skin protection including broad spectrum sunscreen SPF 30+ to sun-exposed areas, reapplying every 2 hours as needed when you're outdoors.   Staying in the shade or wearing long sleeves, sun glasses (UVA+UVB protection) and wide brim hats (4-inch brim around the entire circumference of the hat) are also recommended for sun protection.    Due to recent changes in healthcare laws, you may see results of your pathology and/or laboratory studies on MyChart before the doctors have had a chance to review them. We understand that in some cases there may be results that are confusing or concerning to you. Please understand that not all results are received at the same time and often the doctors may need to interpret multiple results in order to provide you with the best plan of care or course of treatment. Therefore, we ask that you please give us  2 business days to thoroughly review all your results before contacting the office for clarification. Should we see a critical lab result, you will be contacted sooner.   If You Need Anything After Your Visit  If you have any questions or concerns for your doctor, please call our main line at [phone] and press option 4 to reach your doctor's medical assistant. If no one answers, please leave a voicemail as directed and we will return your call as soon as possible. Messages left after 4 pm  will be answered the following business day.   You may also send us  a message via MyChart. We typically respond to MyChart messages within 1-2 business days.  For prescription refills, please ask your pharmacy to contact our office. Our fax number is [phone].  If you have an urgent issue when the clinic is closed that cannot wait until the next business day, you can page your doctor at the number below.    Please note that while we do our best to be available for urgent issues outside of office hours, we are not available 24/7.   If you have an urgent issue and are unable to reach us , you may choose to seek medical care at your doctor's office, retail clinic, urgent care center, or emergency room.  If you have a medical emergency, please immediately call 911 or go to the emergency department.  Pager Numbers  - Dr. Hester: [phone]  - Dr. Jackquline: [phone]  - Dr. Claudene: [phone]  In the event of inclement weather, please call our main line at [phone] for an update on the status of any delays or closures.  Dermatology Medication Tips: Please keep the boxes that topical medications come in in order to help keep track of the instructions about where and how to use these. Pharmacies typically print the medication instructions only on the boxes and not directly on the medication tubes.   If your medication is too expensive, please contact our office at [phone] option 4 or send us  a message through MyChart.   We are unable to tell what your co-pay for medications will be in advance as this is different depending on your insurance coverage. However, we may be able to find a substitute medication at lower cost or fill out paperwork to get insurance to cover a needed medication.   If a prior authorization is required to get your medication covered by your insurance company, please allow us  1-2 business days to complete this process.  Drug prices often vary  depending on where the prescription is filled and some pharmacies may offer cheaper prices.  The website www.goodrx.com contains coupons for medications through different pharmacies. The prices here do not account for what the cost may be with help from insurance (it may be cheaper with your insurance), but the website can give you the price if you did not use any insurance.  - You can print the associated coupon and take it with your prescription to the pharmacy.  - You may also stop by our office during regular business hours and pick up a GoodRx coupon card.  - If you need your prescription sent electronically to a different pharmacy, notify our office through [facility] or by phone at [phone] option 4.     Si Usted Necesita Algo Despus de Su Visita  Tambin puede enviarnos un mensaje a travs de Clinical cytogeneticist. Por lo general respondemos a los mensajes de MyChart en el transcurso de 1 a 2 das hbiles.  Para renovar recetas, por favor pida a su farmacia que se ponga en contacto con nuestra oficina. Randi lakes de fax es [place] [phone].  Si tiene un asunto urgente cuando la clnica est cerrada y que no puede esperar hasta el siguiente da hbil, puede llamar/localizar a su doctor(a) al nmero que aparece a continuacin.   Por favor, tenga en cuenta que aunque hacemos todo lo posible para estar disponibles para asuntos urgentes fuera del horario de [place], no estamos disponibles las 24 horas del da, los 7 [address]  [address] de la [place].   Si tiene un problema urgente y no puede comunicarse con nosotros, puede optar por buscar atencin mdica  en el consultorio de su doctor(a), en una clnica privada, en un centro de atencin urgente o en una sala de emergencias.  Si tiene Engineer, drilling, por favor llame inmediatamente al 911 o vaya a la sala de emergencias.  Nmeros de bper  - Dr. Hester: [phone]  - Dra. Jackquline: [phone]  - Dr. Claudene: [phone]   En caso de  inclemencias del tiempo, por favor llame a landry capes principal al [phone] para una actualizacin sobre el [place] de cualquier retraso o cierre.  Consejos para la medicacin en dermatologa: Por favor, guarde las cajas en las que vienen los medicamentos de uso tpico para ayudarle a seguir las instrucciones sobre dnde y cmo usarlos. Las farmacias generalmente imprimen las instrucciones del medicamento slo en las cajas y no directamente en los tubos del [place].  Si su medicamento es muy caro, por favor, pngase en contacto con landry rieger llamando al [phone] y presione la opcin 4 o envenos un mensaje a travs de Clinical cytogeneticist.   No podemos decirle cul ser su copago por los medicamentos por adelantado ya que esto es diferente dependiendo de la cobertura de su seguro. Sin embargo, es posible que podamos encontrar un medicamento sustituto a Audiological scientist un formulario para que el seguro cubra el medicamento que se considera necesario.   Si se requiere una autorizacin previa para que su compaa de seguros malta su medicamento, por favor permtanos de 1 a 2 das hbiles para completar este proceso.  Los precios de los medicamentos varan con frecuencia dependiendo del Environmental consultant de dnde se surte la receta y alguna farmacias pueden ofrecer precios ms baratos.  El sitio web www.goodrx.com tiene cupones para medicamentos de Health and safety inspector. Los precios aqu no tienen en cuenta lo que podra costar con la ayuda del seguro (puede ser ms barato con su seguro), pero el sitio web puede darle el precio si no utiliz Tourist information centre manager.  - Puede imprimir el cupn correspondiente y llevarlo con su receta a la farmacia.  - Tambin puede pasar por nuestra oficina durante el horario de atencin regular y Education officer, museum una tarjeta de cupones de GoodRx.  - Si necesita que su receta se enve electrnicamente a una farmacia diferente, informe a nuestra oficina a travs de MyChart de [place] o  por telfono llamando al [phone] y presione la opcin 4.

## 2023-12-01 ENCOUNTER — Encounter: Payer: Self-pay | Admitting: Dermatology

## 2023-12-02 ENCOUNTER — Ambulatory Visit (INDEPENDENT_AMBULATORY_CARE_PROVIDER_SITE_OTHER): Admitting: Nurse Practitioner

## 2023-12-02 ENCOUNTER — Encounter (INDEPENDENT_AMBULATORY_CARE_PROVIDER_SITE_OTHER): Payer: Self-pay | Admitting: Nurse Practitioner

## 2023-12-02 VITALS — BP 135/64 | HR 71 | Resp 16 | Wt 139.0 lb

## 2023-12-02 DIAGNOSIS — I83813 Varicose veins of bilateral lower extremities with pain: Secondary | ICD-10-CM | POA: Diagnosis not present

## 2023-12-02 NOTE — Progress Notes (Signed)
 Varicose veins of right  lower extremity with inflammation (454.1  I83.10) Current Plans   Indication: Patient presents with symptomatic varicose veins of the right  lower extremity.   Procedure: Sclerotherapy using hypertonic saline mixed with 1% Lidocaine was performed on the right lower extremity. Compression wraps were placed. The patient tolerated the procedure well.

## 2023-12-07 ENCOUNTER — Inpatient Hospital Stay: Attending: Oncology | Admitting: Licensed Clinical Social Worker

## 2023-12-07 ENCOUNTER — Encounter: Payer: Self-pay | Admitting: Licensed Clinical Social Worker

## 2023-12-07 ENCOUNTER — Inpatient Hospital Stay

## 2023-12-07 ENCOUNTER — Other Ambulatory Visit: Payer: Self-pay | Admitting: Licensed Clinical Social Worker

## 2023-12-07 DIAGNOSIS — Z803 Family history of malignant neoplasm of breast: Secondary | ICD-10-CM | POA: Diagnosis not present

## 2023-12-07 DIAGNOSIS — Z808 Family history of malignant neoplasm of other organs or systems: Secondary | ICD-10-CM | POA: Insufficient documentation

## 2023-12-07 DIAGNOSIS — Z8049 Family history of malignant neoplasm of other genital organs: Secondary | ICD-10-CM | POA: Insufficient documentation

## 2023-12-07 DIAGNOSIS — Z8481 Family history of carrier of genetic disease: Secondary | ICD-10-CM

## 2023-12-07 DIAGNOSIS — Z1379 Encounter for other screening for genetic and chromosomal anomalies: Secondary | ICD-10-CM | POA: Diagnosis present

## 2023-12-07 DIAGNOSIS — Z800 Family history of malignant neoplasm of digestive organs: Secondary | ICD-10-CM | POA: Insufficient documentation

## 2023-12-07 DIAGNOSIS — Z1509 Genetic susceptibility to other malignant neoplasm: Secondary | ICD-10-CM | POA: Insufficient documentation

## 2023-12-07 LAB — GENETIC SCREENING ORDER

## 2023-12-07 NOTE — Progress Notes (Signed)
 REFERRING PROVIDER: Verdon Keen, MD [address] [place],  [place] [address]  PRIMARY PROVIDER:  Cleotilde Oneil FALCON, MD  PRIMARY REASON FOR VISIT:  1. Family history of gene mutation   2. Family history of pancreatic cancer   3. Family history of breast cancer   4. Family history of uterine cancer   5. Family history of melanoma      HISTORY OF PRESENT ILLNESS:   Taylor Hunt, a 73 y.o. female, was seen for a [place] cancer genetics consultation at the request of Dr. 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  Taylor Hunt presents to clinic today to discuss the possibility of a hereditary predisposition to cancer, genetic testing, and to further clarify her future cancer risks, as well as potential cancer risks for family members.   CANCER HISTORY:   Taylor Hunt is a 73 y.o. 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    RELEVANT MEDICAL HISTORY:  Menarche was at age 36.  Ovaries intact: has part of 1 ovary.  Hysterectomy: no.  Menopausal status: postmenopausal.  HRT use: 10 years. Colonoscopy: yes; few polyps on one colonoscopy. Mammogram within the last year: yes. Number of breast biopsies: 2.  Taylor Hunt had negative screening through the [facility] program. [facility] Tier One Population Screen is a screening test that analyzes 11 genes related to hereditary breast and ovarian cancer syndrome (HBOC), Lynch syndrome, and familial hypercholesterolemia. These include APOB, BRCA1, BRCA2, EPCAM, LDLR, LDLRAP1, PCSK9, PMS2 (excluding exons 11-15), MLH1, MSH2, MSH6. This test reports pathogenic and likely pathogenic variants, but does not report on variants of unknown significance. The absence of any additional pathogenic variants is reassuring, but does not rule out a hereditary condition. There are other variants and genes associated with heart disease, hereditary cancer and other inherited  conditions that were not included in this test.   Taylor Hunt also reports history of genetic testing with Dr. Wilder in the past but this testing is not available for review in her chart as this was approximately 18-19 years ago.   Past Medical History:  Diagnosis Date   Anemia    Anxiety    Arthritis    Basal cell carcinoma 10/24/2020   right nasal tip, Moh's 01/09/2021   BCC (basal cell carcinoma) 11/26/2021   Left lateral pretibia. EDC 12/24/2021   Cancer (HCC)    skin   CHF (congestive heart failure) (HCC)    GERD (gastroesophageal reflux disease)    Headache    Heart murmur    Hx of dysplastic nevus 10/15/2009   R dorsum 2nd toe, mild atypia   Hx of dysplastic nevus 11/07/2012   R mid anterior thigh, modterate atypia   Hyperlipemia    Hypothyroidism    Pneumonia    PONV (postoperative nausea and vomiting)    Squamous cell carcinoma of skin 09/15/2006   L forearm, excised   Squamous cell carcinoma of skin 03/21/2008   R lower leg, SCCIS excised   Squamous cell carcinoma of skin 07/19/2008   R forearm   Squamous cell carcinoma of skin 10/25/2012   L mid dorsum forearm   Squamous cell carcinoma of skin 01/24/2014   L posterior lower leg    Past Surgical History:  Procedure Laterality Date   ANTERIOR CERVICAL DECOMP/DISCECTOMY FUSION N/A 09/18/2015   Procedure: ANTERIOR CERVICAL DECOMPRESSION/DISCECTOMY INTERBODY FUSION PLATING BONEGRAFT CERVICAL FIVE-SIX ,CERVICAL SIX-SEVEN ;  Surgeon:  Reyes Budge, MD;  Location: [facility];  Service: Neurosurgery;  Laterality: N/A;   APPENDECTOMY     both shoulder surgery     BREAST EXCISIONAL BIOPSY Right    @ 2000 2 areas excised   CHOLECYSTECTOMY     COLONOSCOPY     COLONOSCOPY WITH PROPOFOL  N/A 12/27/2019   Procedure: COLONOSCOPY WITH PROPOFOL ;  Surgeon: Dessa Reyes ORN, MD;  Location: [facility] ENDOSCOPY;  Service: Endoscopy;  Laterality: N/A;   EYE SURGERY     NEUROPLASTY / TRANSPOSITION ULNAR NERVE AT ELBOW  02/1996    OOPHORECTOMY     right knee surgery     skin cancer removed     TONSILLECTOMY      FAMILY HISTORY:  We obtained a detailed, 4-generation family history.  Significant diagnoses are listed below: Family History  Problem Relation Age of Onset   Pancreatic cancer Mother 13   Lung cancer Father 34   Pancreatic cancer Brother 53   Pancreatic cancer Maternal Uncle        d. 74s   Esophageal cancer Maternal Uncle        d. 67s   Melanoma Maternal Grandfather    Breast cancer Maternal Cousin        x2   Other Maternal Cousin        PALB2+   Taylor Hunt had 1 brother, he passed at 38 of pancreatic cancer.   Taylor Hunt's mother had uterine cancer in her 87s and passed of pancreatic cancer at 38. Patient had 2 maternal uncles. One died of pancreatic cancer in his 67s and his daughter has had breast cancer twice. Patient's other uncle had esophageal cancer. Two of his daughters have reportedly tested positive for a PALB2 mutation. Patient's maternal grandfather passed over age 74, he had history of melanoma and either had lung mets or primary lung cancer.   Taylor Hunt's father passed of lung cancer at 68, he had history of smoking. A paternal uncle passed of kidney cancer over age 54.   Taylor Hunt is aware of previous family history of genetic testing for hereditary cancer risks. There is no reported Ashkenazi Jewish ancestry. There is no known consanguinity.   GENETIC COUNSELING ASSESSMENT: Ms. Taylor Hunt is a 73 y.o. 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 We, therefore, discussed and recommended the following at today's visit.   DISCUSSION: We discussed that, in general, most cancer is not inherited in families, but instead is sporadic or familial. Sporadic cancers occur by chance and typically happen at older ages (>50 years) as this type of cancer is caused  by genetic changes acquired during an individual's lifetime. Some families have more cancers than would be expected by chance; however, the ages or types of cancer are not consistent with a known genetic mutation or known genetic mutations have been ruled out. This type of familial cancer is thought to be due to a combination of multiple genetic, environmental, hormonal, and lifestyle factors. While this combination of factors likely increases the risk of cancer, the exact source of this risk is not currently identifiable or testable.    We discussed that approximately 10% of cancer is hereditary. We discussed the PALB2 gene in detail, noting increased risks for breast, ovarian  and pancreatic cancer associated. There are other genes associated with hereditary cancer as well. Cancers and risks are gene specific. We  discussed that testing is beneficial for several reasons including knowing about cancer risks, identifying potential screening and risk-reduction options that may be appropriate, and to understand if other family members could be at risk for cancer and allow them to undergo genetic testing.   We reviewed the characteristics, features and inheritance patterns of hereditary cancer syndromes. We also discussed genetic testing, including the appropriate family members to test, the process of testing, insurance coverage and turn-around-time for results. We discussed the implications of a negative, positive and/or variant of uncertain significant result. We recommended Ms. Ruvalcaba pursue genetic testing for the [facility] CancerNext-Expanded+RNA gene panel.   The CancerNext-Expanded gene panel offered by [facility] and includes sequencing, rearrangement, and RNA analysis for the following 77 genes: AIP, ALK, APC, ATM, AXIN2, BAP1, BARD1, BMPR1A, BRCA1, BRCA2, BRIP1, CDC73, CDH1, CDK4, CDKN1B, CDKN2A, CEBPA, CHEK2, CTNNA1, DDX41, DICER1, ETV6, FH, FLCN, GATA2, LZTR1, MAX, MBD4, MEN1, MET, MLH1, MSH2, MSH3,  MSH6, MUTYH, NF1, NF2, NTHL1, PALB2, PHOX2B, PMS2, POT1, PRKAR1A, PTCH1, PTEN, RAD51C, RAD51D, RB1, RET, RPS20, RUNX1, SDHA, SDHAF2, SDHB, SDHC, SDHD, SMAD4, SMARCA4, SMARCB1, SMARCE1, STK11, SUFU, TMEM127, TP53, TSC1, TSC2, VHL, and WT1 (sequencing and deletion/duplication); EGFR, HOXB13, KIT, MITF, PDGFRA, POLD1, and POLE (sequencing only); EPCAM and GREM1 (deletion/duplication only).   Based on Ms. Munar's family history of cancer, she meets medical criteria for genetic testing. Despite that she meets criteria, she may still have an out of pocket cost.   PLAN: After considering the risks, benefits, and limitations, Ms. Defelice provided informed consent to pursue genetic testing and the blood sample was sent to [facility] for analysis of the CancerNext-Expanded+RNA Panel. Results should be available within approximately 2-3 weeks' time, at which point they will be disclosed by telephone to Ms. Sainvil, as will any additional recommendations warranted by these results. Ms. Kanan will receive a summary of her genetic counseling visit and a copy of her results once available. This information will also be available in Epic.   Ms. Rohleder's questions were answered to her satisfaction today. Our contact information was provided should additional questions or concerns arise. Thank you for the referral and allowing us  to share in the care of your patient.   Dena Cary, MS, [facility] Genetic Counselor [email or web address] Phone: [phone]  55 minutes were spent on the date of the encounter in service to the patient including preparation, face-to-face consultation, documentation and care coordination. Dr. Delinda was available for discussion regarding this case.   _______________________________________________________________________ For Office Staff:  Number of people involved in session: 1 Was an Intern/ student involved with case: no

## 2023-12-16 ENCOUNTER — Ambulatory Visit: Payer: Self-pay | Admitting: Licensed Clinical Social Worker

## 2023-12-16 ENCOUNTER — Encounter: Payer: Self-pay | Admitting: Licensed Clinical Social Worker

## 2023-12-16 ENCOUNTER — Telehealth: Payer: Self-pay | Admitting: Licensed Clinical Social Worker

## 2023-12-16 DIAGNOSIS — Z800 Family history of malignant neoplasm of digestive organs: Secondary | ICD-10-CM

## 2023-12-16 DIAGNOSIS — Z1379 Encounter for other screening for genetic and chromosomal anomalies: Secondary | ICD-10-CM

## 2023-12-16 NOTE — Telephone Encounter (Signed)
 I contacted Ms. Meir to discuss her genetic testing results. No pathogenic variants were identified in the 77 genes analyzed. Detailed clinic note to follow.   The test report has been scanned into EPIC and is located under the Molecular Pathology section of the Results Review tab.  A portion of the result report is included below for reference.      Dena Cary, MS, [facility] Genetic Counselor [email or web address] Phone: [phone]

## 2023-12-16 NOTE — Progress Notes (Signed)
 HPI:   Taylor Hunt was previously seen in the [facility] due to a family history of cancer and concerns regarding a hereditary predisposition to cancer. Please refer to our prior cancer genetics clinic note for more information regarding our discussion, assessment and recommendations, at the time. Taylor Hunt's recent genetic test results were disclosed to her, as were recommendations warranted by these results. These results and recommendations are discussed in more detail below.  CANCER HISTORY:  Oncology History   No history exists.    FAMILY HISTORY:  We obtained a detailed, 4-generation family history.  Significant diagnoses are listed below:  Taylor Hunt had 1 brother, he passed at 3 of pancreatic cancer.    Taylor Hunt's mother had uterine cancer in her 32s and passed of pancreatic cancer at 45. Patient had 2 maternal uncles. One died of pancreatic cancer in his 70s and his daughter has had breast cancer twice. Patient's other uncle had esophageal cancer. Two of his daughters have reportedly tested positive for a PALB2 mutation. Patient's maternal grandfather passed over age 33, he had history of melanoma and either had lung mets or primary lung cancer. Update 8/7: Taylor Hunt reports her maternal female cousin who tested negative for the PALB2 mutation has had melanoma 3 times.   Taylor Hunt's father passed of lung cancer at 89, he had history of smoking. A paternal uncle passed of kidney cancer over age 24. Update 8/7: Taylor Hunt reports 2 female paternal cousins with breast cancer.    Taylor Hunt is aware of previous family history of genetic testing for hereditary cancer risks. There is no reported Ashkenazi Jewish ancestry. There is no known consanguinity.      GENETIC TEST RESULTS:  The [facility] CancerNext+RNA  Panel found no pathogenic mutations.   The [facility] CancerNext+RNAinsight Panel includes sequencing, rearrangement analysis, and RNA analysis for the following 40 genes: APC, ATM,  BAP1, BARD1, BMPR1A, BRCA1, BRCA2, BRIP1, CDH1, CDKN2A, CHEK2, FH, FLCN, MET, MLH1, MSH2, MSH6, MUTYH, NF1, NTHL1, PALB2, PMS2, PTEN, RAD51C, RAD51D, RPS20, SMAD4, STK11, TP53, TSC1, TSC2, and VHL (sequencing and deletion/duplication); AXIN2, HOXB13, MBD4, MSH3, POLD1 and POLE (sequencing only); EPCAM and GREM1 (deletion/duplication only).  The test report has been scanned into EPIC and is located under the Molecular Pathology section of the Results Review tab.  A portion of the result report is included below for reference. Genetic testing reported out on 12/15/2023.      Even though a pathogenic variant was not identified, possible explanations for the cancer in the family may include: There may be no hereditary risk for cancer in the family. The cancers in Taylor Hunt and/or her family may be sporadic/familial or due to other genetic and environmental factors. There may be a gene mutation in one of these genes that current testing methods cannot detect but that chance is small. There could be another gene that has not yet been discovered, or that we have not yet tested, that is responsible for the cancer diagnoses in the family.  It is also possible there is a hereditary cause for the cancer in the family that Taylor Hunt did not inherit. Therefore, it is important to remain in touch with cancer genetics in the future so that we can continue to offer Taylor Hunt the most up to date genetic testing.   ADDITIONAL GENETIC TESTING:  We discussed with Taylor Hunt that her genetic testing was fairly extensive.  If there are additional relevant genes identified to increase cancer  risk that can be analyzed in the future, we would be happy to discuss and coordinate this testing at that time.    CANCER SCREENING RECOMMENDATIONS:  Taylor Hunt's test result is considered negative (normal).  This means that we have not identified a hereditary cause for her family history of cancer at this time.   An individual's cancer risk and  medical management are not determined by genetic test results alone. Overall cancer risk assessment incorporates additional factors, including personal medical history, family history, and any available genetic information that may result in a personalized plan for cancer prevention and surveillance. Therefore, it is recommended she continue to follow the cancer management and screening guidelines provided by her primary healthcare provider.  Based on the reported personal and family history, specific cancer screenings for Taylor Hunt and her family include:  Pancreatic Cancer Screening/Risk Reduction: The Chief Strategy Officer Association (AGA) recommends the consideration of pancreatic cancer screening in patients determined to be at high risk, including first-degree relatives of patients with pancreatic cancer with at least two affected genetically related relatives.  Taylor Hunt. Chevez's mother, brother and maternal uncle had pancreatic cancer and it is unclear if they had the known familial PALB2 mutation. Therefore, we have placed a referral to Dr. San at [facility].   RECOMMENDATIONS FOR FAMILY MEMBERS:   Individuals in this family might be at some increased risk of developing cancer, over the general population risk, due to the family history of cancer.  Individuals in the family should notify their providers of the family history of cancer. We recommend women in this family have a yearly mammogram beginning at age 20, or 20 years younger than the earliest onset of cancer, an annual clinical breast exam, and perform monthly breast self-exams.  Family members should have colonoscopies by at age 27, or earlier, as recommended by their providers. Other members of the family may still carry a pathogenic variant in one of these genes that Taylor Hunt. Golden did not inherit. Based on the family history, we recommend her maternal relatives have genetic counseling and testing. Taylor Hunt. Caton will let us   know if we can be of any assistance in coordinating genetic counseling and/or testing for this family member.    FOLLOW-UP:  Lastly, we discussed with Taylor Hunt. Bulnes that cancer genetics is a rapidly advancing field and it is possible that new genetic tests will be appropriate for her and/or her family members in the future. We encouraged her to remain in contact with cancer genetics on an annual basis so we can update her personal and family histories and let her know of advances in cancer genetics that may benefit this family.   Our contact number was provided. Taylor Hunt. Bohac's questions were answered to her satisfaction, and she knows she is welcome to call us  at anytime with additional questions or concerns.    Dena Cary, Taylor Hunt, [facility] Genetic Counselor [email or web address] Phone: [phone]

## 2024-01-03 ENCOUNTER — Ambulatory Visit (INDEPENDENT_AMBULATORY_CARE_PROVIDER_SITE_OTHER): Admitting: Nurse Practitioner

## 2024-01-05 ENCOUNTER — Ambulatory Visit: Admitting: Podiatry

## 2024-01-07 ENCOUNTER — Ambulatory Visit (INDEPENDENT_AMBULATORY_CARE_PROVIDER_SITE_OTHER): Admitting: Nurse Practitioner

## 2024-01-07 ENCOUNTER — Encounter (INDEPENDENT_AMBULATORY_CARE_PROVIDER_SITE_OTHER): Payer: Self-pay | Admitting: Nurse Practitioner

## 2024-01-07 VITALS — BP 122/71 | HR 62 | Ht 65.0 in | Wt 135.2 lb

## 2024-01-07 DIAGNOSIS — I83813 Varicose veins of bilateral lower extremities with pain: Secondary | ICD-10-CM | POA: Diagnosis not present

## 2024-01-10 NOTE — Progress Notes (Signed)
 Varicose veins of right  lower extremity with inflammation (454.1  I83.10) Current Plans   Indication: Patient presents with symptomatic varicose veins of the right  lower extremity.   Procedure: Sclerotherapy using hypertonic saline mixed with 1% Lidocaine was performed on the right lower extremity. Compression wraps were placed. The patient tolerated the procedure well.

## 2024-01-11 ENCOUNTER — Encounter: Payer: Self-pay | Admitting: Podiatry

## 2024-01-11 ENCOUNTER — Ambulatory Visit (INDEPENDENT_AMBULATORY_CARE_PROVIDER_SITE_OTHER): Admitting: Podiatry

## 2024-01-11 ENCOUNTER — Ambulatory Visit: Admitting: Podiatry

## 2024-01-11 VITALS — Ht 65.0 in | Wt 135.2 lb

## 2024-01-11 DIAGNOSIS — L600 Ingrowing nail: Secondary | ICD-10-CM

## 2024-01-11 NOTE — Progress Notes (Signed)
 Chief Complaint  Patient presents with   Ingrown Toenail    Pt is here due to right foot great toenail ingrown.    Subjective: Patient presents today for evaluation of pain to the lateral border of the right great toe. Patient is concerned for possible ingrown nail.  It is very sensitive to touch.  Patient presents today for further treatment and evaluation.  Past Medical History:  Diagnosis Date   Anemia    Anxiety    Arthritis    Basal cell carcinoma 10/24/2020   right nasal tip, Moh's 01/09/2021   BCC (basal cell carcinoma) 11/26/2021   Left lateral pretibia. EDC 12/24/2021   Cancer (HCC)    skin   CHF (congestive heart failure) (HCC)    GERD (gastroesophageal reflux disease)    Headache    Heart murmur    Hx of dysplastic nevus 10/15/2009   R dorsum 2nd toe, mild atypia   Hx of dysplastic nevus 11/07/2012   R mid anterior thigh, modterate atypia   Hyperlipemia    Hypothyroidism    Pneumonia    PONV (postoperative nausea and vomiting)    Squamous cell carcinoma of skin 09/15/2006   L forearm, excised   Squamous cell carcinoma of skin 03/21/2008   R lower leg, SCCIS excised   Squamous cell carcinoma of skin 07/19/2008   R forearm   Squamous cell carcinoma of skin 10/25/2012   L mid dorsum forearm   Squamous cell carcinoma of skin 01/24/2014   L posterior lower leg    Past Surgical History:  Procedure Laterality Date   ANTERIOR CERVICAL DECOMP/DISCECTOMY FUSION N/A 09/18/2015   Procedure: ANTERIOR CERVICAL DECOMPRESSION/DISCECTOMY INTERBODY FUSION PLATING BONEGRAFT CERVICAL FIVE-SIX ,CERVICAL SIX-SEVEN ;  Surgeon: Reyes Budge, MD;  Location: [facility];  Service: Neurosurgery;  Laterality: N/A;   APPENDECTOMY     both shoulder surgery     BREAST EXCISIONAL BIOPSY Right    @ 2000 2 areas excised   CHOLECYSTECTOMY     COLONOSCOPY     COLONOSCOPY WITH PROPOFOL  N/A 12/27/2019   Procedure: COLONOSCOPY WITH PROPOFOL ;  Surgeon: Dessa Reyes ORN, MD;   Location: [facility];  Service: Endoscopy;  Laterality: N/A;   EYE SURGERY     NEUROPLASTY / TRANSPOSITION ULNAR NERVE AT ELBOW  02/1996   OOPHORECTOMY     right knee surgery     skin cancer removed     TONSILLECTOMY      Allergies  Allergen Reactions   Fentanyl  Nausea And Vomiting   Codeine Other (See Comments)    Dizziness, nausea, vomiting   Gabapentin Other (See Comments)   Hydrocodone  Nausea And Vomiting   Lovastatin Other (See Comments)    Muscle pain   Morphine Hives   Tramadol Nausea And Vomiting    Objective:  General: Well developed, nourished, in no acute distress, alert and oriented x3   Dermatology: Skin is warm, dry and supple bilateral.  Lateral border right great toe is tender with evidence of an ingrowing nail. Pain on palpation noted to the border of the nail fold. The remaining nails appear unremarkable at this time.   Vascular: DP and PT pulses palpable.  No clinical evidence of vascular compromise  Neruologic: Grossly intact via light touch bilateral.  Musculoskeletal: No pedal deformity noted  Assesement: #1 Paronychia with ingrowing nail lateral border right great toe  Plan of Care:  -Patient evaluated.  -Discussed treatment alternatives and plan of care. Explained nail avulsion procedure and post procedure  course to patient. - Unfortunate the patient had to leave for another appointment and did not have time for the procedure today.  We opted for simple conservative management and debridement of the offending border of the nail.  Unfortunately she only received modest improvement and reduction of her pain. -She will make an appointment as needed to have the ingrown portion of nail treated permanently with chemical matricectomy  *Teaches tennis lessons  Thresa EMERSON Sar, DPM [facility]  Dr. Thresa EMERSON Sar, DPM    [address] [place], [place] [address]                Office [phone]   Fax [phone]

## 2024-02-04 ENCOUNTER — Ambulatory Visit (INDEPENDENT_AMBULATORY_CARE_PROVIDER_SITE_OTHER): Admitting: Nurse Practitioner

## 2024-02-04 ENCOUNTER — Encounter (INDEPENDENT_AMBULATORY_CARE_PROVIDER_SITE_OTHER): Payer: Self-pay | Admitting: Nurse Practitioner

## 2024-02-04 VITALS — BP 118/70 | HR 71 | Resp 16 | Ht 65.0 in | Wt 136.0 lb

## 2024-02-04 DIAGNOSIS — I83813 Varicose veins of bilateral lower extremities with pain: Secondary | ICD-10-CM

## 2024-02-05 ENCOUNTER — Encounter (INDEPENDENT_AMBULATORY_CARE_PROVIDER_SITE_OTHER): Payer: Self-pay | Admitting: Nurse Practitioner

## 2024-02-05 NOTE — Progress Notes (Signed)
 Varicose veins of right  lower extremity with inflammation (454.1  I83.10) Current Plans   Indication: Patient presents with symptomatic varicose veins of the right  lower extremity.   Procedure: Sclerotherapy using hypertonic saline mixed with 1% Lidocaine was performed on the right lower extremity. Compression wraps were placed. The patient tolerated the procedure well.

## 2024-03-02 ENCOUNTER — Encounter: Payer: Self-pay | Admitting: Licensed Clinical Social Worker

## 2024-03-03 ENCOUNTER — Ambulatory Visit (INDEPENDENT_AMBULATORY_CARE_PROVIDER_SITE_OTHER): Admitting: Nurse Practitioner

## 2024-03-03 VITALS — BP 116/72 | HR 73 | Resp 18 | Ht 65.0 in | Wt 133.8 lb

## 2024-03-03 DIAGNOSIS — I83813 Varicose veins of bilateral lower extremities with pain: Secondary | ICD-10-CM | POA: Diagnosis not present

## 2024-03-05 ENCOUNTER — Encounter (INDEPENDENT_AMBULATORY_CARE_PROVIDER_SITE_OTHER): Payer: Self-pay | Admitting: Nurse Practitioner

## 2024-03-05 NOTE — Progress Notes (Signed)
 Varicose veins of right  lower extremity with inflammation (454.1  I83.10) Current Plans   Indication: Patient presents with symptomatic varicose veins of the right  lower extremity.   Procedure: Sclerotherapy using hypertonic saline mixed with 1% Lidocaine was performed on the right lower extremity. Compression wraps were placed. The patient tolerated the procedure well.

## 2024-03-06 ENCOUNTER — Other Ambulatory Visit: Payer: Self-pay | Admitting: Obstetrics and Gynecology

## 2024-03-06 DIAGNOSIS — Z1231 Encounter for screening mammogram for malignant neoplasm of breast: Secondary | ICD-10-CM

## 2024-03-09 ENCOUNTER — Ambulatory Visit: Admitting: Dermatology

## 2024-03-09 DIAGNOSIS — L578 Other skin changes due to chronic exposure to nonionizing radiation: Secondary | ICD-10-CM | POA: Diagnosis not present

## 2024-03-09 DIAGNOSIS — H00011 Hordeolum externum right upper eyelid: Secondary | ICD-10-CM

## 2024-03-09 DIAGNOSIS — L820 Inflamed seborrheic keratosis: Secondary | ICD-10-CM | POA: Diagnosis not present

## 2024-03-09 DIAGNOSIS — L814 Other melanin hyperpigmentation: Secondary | ICD-10-CM

## 2024-03-09 DIAGNOSIS — L570 Actinic keratosis: Secondary | ICD-10-CM

## 2024-03-09 DIAGNOSIS — W908XXA Exposure to other nonionizing radiation, initial encounter: Secondary | ICD-10-CM | POA: Diagnosis not present

## 2024-03-09 DIAGNOSIS — Z7189 Other specified counseling: Secondary | ICD-10-CM

## 2024-03-09 NOTE — Patient Instructions (Addendum)
 Seborrheic Keratosis  What causes seborrheic keratoses? Seborrheic keratoses are harmless, common skin growths that first appear during adult life.  As time goes by, more growths appear.  Some people may develop a large number of them.  Seborrheic keratoses appear on both covered and uncovered body parts.  They are not caused by sunlight.  The tendency to develop seborrheic keratoses can be inherited.  They vary in color from skin-colored to gray, brown, or even black.  They can be either smooth or have a rough, warty surface.   Seborrheic keratoses are superficial and look as if they were stuck on the skin.  Under the microscope this type of keratosis looks like layers upon layers of skin.  That is why at times the top layer may seem to fall off, but the rest of the growth remains and re-grows.    Treatment Seborrheic keratoses do not need to be treated, but can easily be removed in the office.  Seborrheic keratoses often cause symptoms when they rub on clothing or jewelry.  Lesions can be in the way of shaving.  If they become inflamed, they can cause itching, soreness, or burning.  Removal of a seborrheic keratosis can be accomplished by freezing, burning, or surgery. If any spot bleeds, scabs, or grows rapidly, please return to have it checked, as these can be an indication of a skin cancer.  Cryotherapy Aftercare  Wash gently with soap and water everyday.   Apply Vaseline and Band-Aid daily until healed.   Actinic keratoses are precancerous spots that appear secondary to cumulative UV radiation exposure/sun exposure over time. They are chronic with expected duration over 1 year. A portion of actinic keratoses will progress to squamous cell carcinoma of the skin. It is not possible to reliably predict which spots will progress to skin cancer and so treatment is recommended to prevent development of skin cancer.  Recommend daily broad spectrum sunscreen SPF 30+ to sun-exposed areas, reapply  every 2 hours as needed.  Recommend staying in the shade or wearing long sleeves, sun glasses (UVA+UVB protection) and wide brim hats (4-inch brim around the entire circumference of the hat). Call for new or changing lesions.    Due to recent changes in healthcare laws, you may see results of your pathology and/or laboratory studies on MyChart before the doctors have had a chance to review them. We understand that in some cases there may be results that are confusing or concerning to you. Please understand that not all results are received at the same time and often the doctors may need to interpret multiple results in order to provide you with the best plan of care or course of treatment. Therefore, we ask that you please give us  2 business days to thoroughly review all your results before contacting the office for clarification. Should we see a critical lab result, you will be contacted sooner.   If You Need Anything After Your Visit  If you have any questions or concerns for your doctor, please call our main line at [phone] and press option 4 to reach your doctor's medical assistant. If no one answers, please leave a voicemail as directed and we will return your call as soon as possible. Messages left after 4 pm will be answered the following business day.   You may also send us  a message via MyChart. We typically respond to MyChart messages within 1-2 business days.  For prescription refills, please ask your pharmacy to contact our office. Our fax  number is [phone].  If you have an urgent issue when the clinic is closed that cannot wait until the next business day, you can page your doctor at the number below.    Please note that while we do our best to be available for urgent issues outside of office hours, we are not available 24/7.   If you have an urgent issue and are unable to reach us , you may choose to seek medical care at your doctor's office, retail clinic, urgent care  center, or emergency room.  If you have a medical emergency, please immediately call 911 or go to the emergency department.  Pager Numbers  - Dr. Hester: [phone]  - Dr. Jackquline: [phone]  - Dr. Claudene: [phone]   - Dr. Raymund: [phone]  In the event of inclement weather, please call our main line at [phone] for an update on the status of any delays or closures.  Dermatology Medication Tips: Please keep the boxes that topical medications come in in order to help keep track of the instructions about where and how to use these. Pharmacies typically print the medication instructions only on the boxes and not directly on the medication tubes.   If your medication is too expensive, please contact our office at [phone] option 4 or send us  a message through MyChart.   We are unable to tell what your co-pay for medications will be in advance as this is different depending on your insurance coverage. However, we may be able to find a substitute medication at lower cost or fill out paperwork to get insurance to cover a needed medication.   If a prior authorization is required to get your medication covered by your insurance company, please allow us  1-2 business days to complete this process.  Drug prices often vary depending on where the prescription is filled and some pharmacies may offer cheaper prices.  The website www.goodrx.com contains coupons for medications through different pharmacies. The prices here do not account for what the cost may be with help from insurance (it may be cheaper with your insurance), but the website can give you the price if you did not use any insurance.  - You can print the associated coupon and take it with your prescription to the pharmacy.  - You may also stop by our office during regular business hours and pick up a GoodRx coupon card.  - If you need your prescription sent electronically to a different pharmacy, notify our office  through [facility] or by phone at [phone] option 4.     Si Usted Necesita Algo Despus de Su Visita  Tambin puede enviarnos un mensaje a travs de Clinical Cytogeneticist. Por lo general respondemos a los mensajes de MyChart en el transcurso de 1 a 2 das hbiles.  Para renovar recetas, por favor pida a su farmacia que se ponga en contacto con nuestra oficina. Randi lakes de fax es [place] [phone].  Si tiene un asunto urgente cuando la clnica est cerrada y que no puede esperar hasta el siguiente da hbil, puede llamar/localizar a su doctor(a) al nmero que aparece a continuacin.   Por favor, tenga en cuenta que aunque hacemos todo lo posible para estar disponibles para asuntos urgentes fuera del horario de [place], no estamos disponibles las 24 horas del da, los 7 [address]  [address] de la [place].   Si tiene un problema urgente y no puede comunicarse con nosotros, puede optar por buscar atencin mdica  en el consultorio de su doctor(a), en ignacia  clnica privada, en un centro de atencin urgente o en una sala de emergencias.  Si tiene engineer, drilling, por favor llame inmediatamente al 911 o vaya a la sala de emergencias.  Nmeros de bper  - Dr. Hester: [phone]  - Dra. Jackquline: [phone]  - Dr. Claudene: [phone]  - Dra. Kitts: [phone]  En caso de inclemencias del [place], por favor llame a nuestra lnea principal al [phone] para una actualizacin sobre el estado de cualquier retraso o cierre.  Consejos para la medicacin en dermatologa: Por favor, guarde las cajas en las que vienen los medicamentos de uso tpico para ayudarle a seguir las instrucciones sobre dnde y cmo usarlos. Las farmacias generalmente imprimen las instrucciones del medicamento slo en las cajas y no directamente en los tubos del [place].   Si su medicamento es muy caro, por favor, pngase en contacto con landry rieger llamando al [phone] y presione la opcin 4 o envenos un mensaje  a travs de Clinical Cytogeneticist.   No podemos decirle cul ser su copago por los medicamentos por adelantado ya que esto es diferente dependiendo de la cobertura de su seguro. Sin embargo, es posible que podamos encontrar un medicamento sustituto a audiological scientist un formulario para que el seguro cubra el medicamento que se considera necesario.   Si se requiere una autorizacin previa para que su compaa de seguros cubra su medicamento, por favor permtanos de 1 a 2 das hbiles para completar este proceso.  Los precios de los medicamentos varan con frecuencia dependiendo del environmental consultant de dnde se surte la receta y alguna farmacias pueden ofrecer precios ms baratos.  El sitio web www.goodrx.com tiene cupones para medicamentos de health and safety inspector. Los precios aqu no tienen en cuenta lo que podra costar con la ayuda del seguro (puede ser ms barato con su seguro), pero el sitio web puede darle el precio si no utiliz tourist information centre manager.  - Puede imprimir el cupn correspondiente y llevarlo con su receta a la farmacia.  - Tambin puede pasar por nuestra oficina durante el horario de atencin regular y education officer, museum una tarjeta de cupones de GoodRx.  - Si necesita que su receta se enve electrnicamente a una farmacia diferente, informe a nuestra oficina a travs de MyChart de [place] o por telfono llamando al [phone] y presione la opcin 4.

## 2024-03-09 NOTE — Progress Notes (Signed)
 Follow-Up Visit   Subjective  Taylor Hunt is a 73 y.o. female who presents for the following: patient is here for her 3rd bbl treatment. She would like to discuss possibly having bbl treatment for neck, chest, and hands.  Patient has some rough spots on face, neck and chest.  Also has a itchy spot at right eyelid margin that noticed in the last week.  The following portions of the chart were reviewed this encounter and updated as appropriate: medications, allergies, medical history  Review of Systems:  No other skin or systemic complaints except as noted in HPI or Assessment and Plan.  Objective  Well appearing patient in no apparent distress; mood and affect are within normal limits.  A focused examination was performed of the following areas: Face, neck, hands, chest   Relevant exam findings are noted in the Assessment and Plan.  Before photos of Face and Hands BBL              Brown Spot Treatment at [employer] [facility] [employer] Spot Treatment Face settings   [place] Spot treatment at [employer] spot treatment hand settings   Full all over face treatment for brown spots  Full all over face treatment for brown spots    Full all over hand treatment for brown spots   Full all over hand treatment for brown spots   left upper lip x 1, right cheek x 1 (2) Erythematous thin papules/macules with gritty scale.  neck, face, hands and chest x 25 (25) Erythematous stuck-on, waxy papule or plaque right upper medial eyelid margin at medial canthus 2 mm flesh colored papule    Assessment & Plan   LENTIGINES and Actinic Changes of FACE Exam: scattered tan macules at face and b/l hands Treatment Plan: BBL treatment today    Laser safety: Patient was advised in laser safety.  Patient was fitted with laser safety goggles and advised to keep eyes closed during procedure with goggles on. Staff and provider ensured that patient and their own safety goggles were also on and eyes  protected during procedure. Laser room door was secured and locked from the inside. Laser room door has laser safety sign affixed to the outside of the door.    [place] BBL - 03/09/24 1500      Patient Details   Skin Type: I    Anesthestic Cream Applied: No    Photo Takes: Yes    Consent Signed: Yes    Improvement from Previous Treatment: Yes      Treatment Details   Date: 03/09/24    Treatment #: 3    Area: f    Filter: 1st Pass;2nd Pass;3rd Pass;4th Pass      1st Pass   Location: F    Device: 515   Pigmented lesions - for Brown spot treatment at face   BBL j/cm2: 15    PW Msec Sec: 15    Target Temp: 15    Pulses: 25    11mm: this one      2nd Pass   Location: Other   B/l hands   Device: 515   pigmented lesions - brown spot treatment for hands   BBL j/cm2: 15    PW Msec Sec: 15    Target Temp: 15    Pulses: 72    11mm: this one      3rd Pass   Location: F    Device: 515   pigmented lesions - full all  over face treatment for brown  spots   BBL j/cm2: 13    PW Msec Sec: 10    Target Temp: 25    Pulses: 105    15x15: this one      4th Pass   Location: Other   Hands   Device: 515   pigmented lesions at b/l hands   BBL j/cm2: 10    PW Msec Sec: 15    Target Temp: 22    Pulses: 80    15x15: this one    Patient given 3rd sample kit today     Patient tolerated the procedure well.   [place] avoidance was stressed. The patient will call with any problems, questions or concerns prior to their next appointment.   ACTINIC DAMAGE - chronic, secondary to cumulative UV radiation exposure/sun exposure over time - diffuse scaly erythematous macules with underlying dyspigmentation - Recommend daily broad spectrum sunscreen SPF 30+ to sun-exposed areas, reapply every 2 hours as needed.  - Recommend staying in the shade or wearing long sleeves, sun glasses (UVA+UVB protection) and wide brim hats (4-inch brim around the entire circumference of the hat). - Call for new or  changing lesions.  ACTINIC KERATOSIS (2) left upper lip x 1, right cheek x 1 (2) Actinic keratoses are precancerous spots that appear secondary to cumulative UV radiation exposure/sun exposure over time. They are chronic with expected duration over 1 year. A portion of actinic keratoses will progress to squamous cell carcinoma of the skin. It is not possible to reliably predict which spots will progress to skin cancer and so treatment is recommended to prevent development of skin cancer.  Recommend daily broad spectrum sunscreen SPF 30+ to sun-exposed areas, reapply every 2 hours as needed.  Recommend staying in the shade or wearing long sleeves, sun glasses (UVA+UVB protection) and wide brim hats (4-inch brim around the entire circumference of the hat). Call for new or changing lesions. Destruction of lesion - left upper lip x 1, right cheek x 1 (2) Complexity: simple   Destruction method: cryotherapy   Informed consent: discussed and consent obtained   Timeout:  patient name, date of birth, surgical site, and procedure verified Lesion destroyed using liquid nitrogen: Yes   Region frozen until ice ball extended beyond lesion: Yes   Outcome: patient tolerated procedure well with no complications   Post-procedure details: wound care instructions given    INFLAMED SEBORRHEIC KERATOSIS (25) neck, face, hands and chest x 25 (25) Symptomatic, irritating, patient would like treated. Destruction of lesion - neck, face, hands and chest x 25 (25) Complexity: simple   Destruction method: cryotherapy   Informed consent: discussed and consent obtained   Timeout:  patient name, date of birth, surgical site, and procedure verified Lesion destroyed using liquid nitrogen: Yes   Region frozen until ice ball extended beyond lesion: Yes   Outcome: patient tolerated procedure well with no complications   Post-procedure details: wound care instructions given    HORDEOLUM EXTERNUM OF RIGHT UPPER  EYELID right upper medial eyelid margin at medial canthus Vs other   Advised patient that does not look like skin cancer, and appears to possibly be a stye.  I would recommend evaluation by Ophthalmology.  Recommend Ophthalmology referral to Dr. Dingeldein to further determine and treat..  If biopsy necessary,  Dr D may do or send to Oculoplastics.  Ambulatory referral to Ophthalmology - right upper medial eyelid margin at medial canthus  No follow-ups on file.  I, Melissa  Tanda, CMA, am acting as scribe for Alm Rhyme, MD.   Documentation: I have reviewed the above documentation for accuracy and completeness, and I agree with the above.  Alm Rhyme, MD

## 2024-03-13 ENCOUNTER — Encounter: Payer: Self-pay | Admitting: Dermatology

## 2024-03-21 ENCOUNTER — Ambulatory Visit: Admitting: Podiatry

## 2024-03-21 ENCOUNTER — Encounter: Payer: Self-pay | Admitting: Podiatry

## 2024-03-21 VITALS — Ht 65.0 in | Wt 133.8 lb

## 2024-03-21 DIAGNOSIS — L600 Ingrowing nail: Secondary | ICD-10-CM | POA: Diagnosis not present

## 2024-03-21 NOTE — Patient Instructions (Signed)

## 2024-03-21 NOTE — Progress Notes (Signed)
 Chief Complaint  Patient presents with   Ingrown Toenail    Pt is here due to right great toenail ingrown.    Subjective: Patient presents today for evaluation of pain to the lateral border right great toe. Patient is concerned for possible ingrown nail.  It is very sensitive to touch.  Patient presents today for further treatment and evaluation.  Past Medical History:  Diagnosis Date   Anemia    Anxiety    Arthritis    Basal cell carcinoma 10/24/2020   right nasal tip, Moh's 01/09/2021   BCC (basal cell carcinoma) 11/26/2021   Left lateral pretibia. EDC 12/24/2021   Cancer (HCC)    skin   CHF (congestive heart failure) (HCC)    GERD (gastroesophageal reflux disease)    Headache    Heart murmur    Hx of dysplastic nevus 10/15/2009   R dorsum 2nd toe, mild atypia   Hx of dysplastic nevus 11/07/2012   R mid anterior thigh, modterate atypia   Hyperlipemia    Hypothyroidism    Pneumonia    PONV (postoperative nausea and vomiting)    Squamous cell carcinoma of skin 09/15/2006   L forearm, excised   Squamous cell carcinoma of skin 03/21/2008   R lower leg, SCCIS excised   Squamous cell carcinoma of skin 07/19/2008   R forearm   Squamous cell carcinoma of skin 10/25/2012   L mid dorsum forearm   Squamous cell carcinoma of skin 01/24/2014   L posterior lower leg    Past Surgical History:  Procedure Laterality Date   ANTERIOR CERVICAL DECOMP/DISCECTOMY FUSION N/A 09/18/2015   Procedure: ANTERIOR CERVICAL DECOMPRESSION/DISCECTOMY INTERBODY FUSION PLATING BONEGRAFT CERVICAL FIVE-SIX ,CERVICAL SIX-SEVEN ;  Surgeon: Reyes Budge, MD;  Location: [facility];  Service: Neurosurgery;  Laterality: N/A;   APPENDECTOMY     both shoulder surgery     BREAST EXCISIONAL BIOPSY Right    @ 2000 2 areas excised   CHOLECYSTECTOMY     COLONOSCOPY     COLONOSCOPY WITH PROPOFOL  N/A 12/27/2019   Procedure: COLONOSCOPY WITH PROPOFOL ;  Surgeon: Dessa Reyes ORN, MD;  Location: [facility]  ENDOSCOPY;  Service: Endoscopy;  Laterality: N/A;   EYE SURGERY     NEUROPLASTY / TRANSPOSITION ULNAR NERVE AT ELBOW  02/1996   OOPHORECTOMY     right knee surgery     skin cancer removed     TONSILLECTOMY      Allergies  Allergen Reactions   Fentanyl  Nausea And Vomiting   Codeine Other (See Comments)    Dizziness, nausea, vomiting   Gabapentin Other (See Comments)   Hydrocodone  Nausea And Vomiting   Lovastatin Other (See Comments)    Muscle pain   Morphine Hives   Tramadol Nausea And Vomiting    Objective:  General: Well developed, nourished, in no acute distress, alert and oriented x3   Dermatology: Skin is warm, dry and supple bilateral.  Lateral border right great toe is tender with evidence of an ingrowing nail. Pain on palpation noted to the border of the nail fold. The remaining nails appear unremarkable at this time.   Vascular: DP and PT pulses palpable.  No clinical evidence of vascular compromise  Neruologic: Grossly intact via light touch bilateral.  Musculoskeletal: No pedal deformity noted  Assesement: #1 Paronychia with ingrowing nail lateral border right great toe  Plan of Care:  -Patient evaluated.  -Discussed treatment alternatives and plan of care. Explained nail avulsion procedure and post procedure course to patient. -  Patient opted for permanent partial nail avulsion of the ingrown portion of the nail.  -Prior to procedure, local anesthesia infiltration utilized using 3 ml of a 50:50 mixture of 2% plain lidocaine  and 0.5% plain marcaine  in a normal hallux block fashion and a betadine prep performed.  -Partial permanent nail avulsion with chemical matrixectomy performed using 3x30sec applications of phenol followed by alcohol flush.  -Light dressing applied.  Post care instructions provided -Return to clinic 3 weeks  *Teaches tennis at [facility]  Thresa EMERSON Sar, DPM [facility]  Dr. Thresa EMERSON Sar, DPM    [address]  [place], [place] [address]                Office [phone]  Fax [phone]

## 2024-04-11 ENCOUNTER — Ambulatory Visit: Admitting: Podiatry

## 2024-04-11 ENCOUNTER — Encounter: Payer: Self-pay | Admitting: Podiatry

## 2024-04-11 VITALS — Ht 65.0 in | Wt 133.8 lb

## 2024-04-11 DIAGNOSIS — L600 Ingrowing nail: Secondary | ICD-10-CM | POA: Diagnosis not present

## 2024-04-11 MED ORDER — CEPHALEXIN 500 MG PO CAPS: 500.0000 mg | ORAL_CAPSULE | Freq: Four times a day (QID) | ORAL | 0 refills | Status: AC

## 2024-04-11 MED ORDER — MUPIROCIN 2 % EX OINT: 1.0000 | TOPICAL_OINTMENT | Freq: Two times a day (BID) | CUTANEOUS | 1 refills | Status: AC

## 2024-04-11 NOTE — Progress Notes (Signed)
   Chief Complaint  Patient presents with   Ingrown Toenail    Pt is here to f/u on right great toenail after having ingrown removed on 11/11, she states some soreness to the toe and tender to touch.    Subjective: 73 y.o. female presents today status post permanent nail avulsion procedure of the lateral border right great toe that was performed on 03/21/2024.  Continues to have some slight tenderness to the area..   Past Medical History:  Diagnosis Date   Anemia    Anxiety    Arthritis    Basal cell carcinoma 10/24/2020   right nasal tip, Moh's 01/09/2021   BCC (basal cell carcinoma) 11/26/2021   Left lateral pretibia. EDC 12/24/2021   Cancer (HCC)    skin   CHF (congestive heart failure) (HCC)    GERD (gastroesophageal reflux disease)    Headache    Heart murmur    Hx of dysplastic nevus 10/15/2009   R dorsum 2nd toe, mild atypia   Hx of dysplastic nevus 11/07/2012   R mid anterior thigh, modterate atypia   Hyperlipemia    Hypothyroidism    Pneumonia    PONV (postoperative nausea and vomiting)    Squamous cell carcinoma of skin 09/15/2006   L forearm, excised   Squamous cell carcinoma of skin 03/21/2008   R lower leg, SCCIS excised   Squamous cell carcinoma of skin 07/19/2008   R forearm   Squamous cell carcinoma of skin 10/25/2012   L mid dorsum forearm   Squamous cell carcinoma of skin 01/24/2014   L posterior lower leg    Objective: Neurovascular status intact.  Skin is warm, dry and supple.  There is some very mild localized erythema at the nail avulsion site.  No drainage.  It appears to be a very mild localized cellulitis of the nail matricectomy site  Assessment: #1 s/p partial permanent nail matrixectomy lateral border right great toe.  03/21/2024   Plan of care: -Patient was evaluated  -Light debridement of the periungual debris was performed to the border of the respective toe and nail plate using a tissue nipper. -Patient is expected to have hand  surgery this Friday.  I would suspect that she would be fine to proceed with surgery.  There is only mild erythema just around the area of the nail avulsion -I did call in a prescription for Keflex 500 mg QID x 7 days -Also prescription for mupirocin  2% ointment apply daily -Patient expected to have hand surgery.  No follow-ups on file   Thresa EMERSON Sar, DPM [facility]  Dr. Thresa EMERSON Sar, DPM    [address] [place], [place] [address]                Office [phone]  Fax [phone]

## 2024-04-12 ENCOUNTER — Ambulatory Visit
Admission: RE | Admit: 2024-04-12 | Discharge: 2024-04-12 | Disposition: A | Source: Ambulatory Visit | Attending: Obstetrics and Gynecology | Admitting: Obstetrics and Gynecology

## 2024-04-12 DIAGNOSIS — Z1231 Encounter for screening mammogram for malignant neoplasm of breast: Secondary | ICD-10-CM | POA: Insufficient documentation

## 2024-04-14 ENCOUNTER — Other Ambulatory Visit: Payer: Self-pay | Admitting: Neurology

## 2024-04-14 DIAGNOSIS — R55 Syncope and collapse: Secondary | ICD-10-CM

## 2024-04-14 DIAGNOSIS — R569 Unspecified convulsions: Secondary | ICD-10-CM

## 2024-04-19 ENCOUNTER — Ambulatory Visit (INDEPENDENT_AMBULATORY_CARE_PROVIDER_SITE_OTHER): Admitting: Nurse Practitioner

## 2024-04-20 ENCOUNTER — Ambulatory Visit: Admission: RE | Admit: 2024-04-20 | Discharge: 2024-04-20 | Attending: Neurology | Admitting: Neurology

## 2024-04-20 DIAGNOSIS — R55 Syncope and collapse: Secondary | ICD-10-CM | POA: Insufficient documentation

## 2024-04-20 DIAGNOSIS — R569 Unspecified convulsions: Secondary | ICD-10-CM | POA: Diagnosis present

## 2024-04-26 ENCOUNTER — Encounter: Payer: Self-pay | Admitting: Podiatry

## 2024-05-13 ENCOUNTER — Other Ambulatory Visit: Payer: Self-pay | Admitting: Dermatology

## 2024-05-13 DIAGNOSIS — Z8619 Personal history of other infectious and parasitic diseases: Secondary | ICD-10-CM

## 2024-06-06 ENCOUNTER — Ambulatory Visit: Admitting: Dermatology

## 2024-06-28 ENCOUNTER — Ambulatory Visit: Admitting: Dermatology

## 2024-07-03 ENCOUNTER — Ambulatory Visit: Admitting: Dermatology

## 2024-07-05 ENCOUNTER — Ambulatory Visit: Admitting: Dermatology
